# Patient Record
Sex: Male | Born: 1935 | Race: White | Hispanic: No | Smoking: Never smoker
Health system: Southern US, Community
[De-identification: ages and names within clinical notes are randomized; demographics above are authoritative.]

## PROBLEM LIST (undated history)

## (undated) DIAGNOSIS — F329 Major depressive disorder, single episode, unspecified: Secondary | ICD-10-CM

## (undated) DIAGNOSIS — I639 Cerebral infarction, unspecified: Secondary | ICD-10-CM

## (undated) DIAGNOSIS — F4310 Post-traumatic stress disorder, unspecified: Secondary | ICD-10-CM

## (undated) DIAGNOSIS — K922 Gastrointestinal hemorrhage, unspecified: Secondary | ICD-10-CM

## (undated) DIAGNOSIS — J449 Chronic obstructive pulmonary disease, unspecified: Secondary | ICD-10-CM

## (undated) DIAGNOSIS — I10 Essential (primary) hypertension: Secondary | ICD-10-CM

## (undated) DIAGNOSIS — F0150 Vascular dementia without behavioral disturbance: Secondary | ICD-10-CM

## (undated) HISTORY — PX: TOTAL HIP ARTHROPLASTY: SHX124

---

## 2003-05-29 ENCOUNTER — Emergency Department (HOSPITAL_COMMUNITY): Admission: EM | Admit: 2003-05-29 | Discharge: 2003-05-30 | Payer: Self-pay | Admitting: Emergency Medicine

## 2003-05-29 ENCOUNTER — Encounter: Payer: Self-pay | Admitting: Emergency Medicine

## 2007-04-30 ENCOUNTER — Emergency Department (HOSPITAL_COMMUNITY): Admission: EM | Admit: 2007-04-30 | Discharge: 2007-04-30 | Payer: Self-pay | Admitting: Emergency Medicine

## 2008-02-15 ENCOUNTER — Inpatient Hospital Stay (HOSPITAL_COMMUNITY): Admission: EM | Admit: 2008-02-15 | Discharge: 2008-02-22 | Payer: Self-pay | Admitting: Emergency Medicine

## 2008-02-16 ENCOUNTER — Ambulatory Visit: Payer: Self-pay | Admitting: Gastroenterology

## 2008-02-17 ENCOUNTER — Ambulatory Visit: Payer: Self-pay | Admitting: Gastroenterology

## 2008-02-19 ENCOUNTER — Encounter (INDEPENDENT_AMBULATORY_CARE_PROVIDER_SITE_OTHER): Payer: Self-pay | Admitting: General Surgery

## 2010-06-20 ENCOUNTER — Emergency Department (HOSPITAL_COMMUNITY)
Admission: EM | Admit: 2010-06-20 | Discharge: 2010-06-21 | Payer: Self-pay | Source: Home / Self Care | Admitting: Diagnostic Radiology

## 2010-11-29 ENCOUNTER — Emergency Department (HOSPITAL_COMMUNITY)
Admission: EM | Admit: 2010-11-29 | Discharge: 2010-11-29 | Disposition: A | Discharge status: Other Acute Inpatient Hospital | Payer: Medicare Other | Source: Home / Self Care | Attending: Emergency Medicine | Admitting: Emergency Medicine

## 2010-11-29 ENCOUNTER — Emergency Department (HOSPITAL_COMMUNITY): Payer: Medicare Other

## 2010-11-29 ENCOUNTER — Inpatient Hospital Stay (HOSPITAL_COMMUNITY)
Admission: EM | Admit: 2010-11-29 | Discharge: 2010-12-05 | DRG: 069 | Disposition: A | Discharge status: Skilled Nursing Facility | Payer: Medicare Other | Source: Other Acute Inpatient Hospital | Attending: Neurology | Admitting: Neurology

## 2010-11-29 DIAGNOSIS — Z1810 Retained metal fragments, unspecified: Secondary | ICD-10-CM

## 2010-11-29 DIAGNOSIS — I6350 Cerebral infarction due to unspecified occlusion or stenosis of unspecified cerebral artery: Secondary | ICD-10-CM | POA: Diagnosis present

## 2010-11-29 DIAGNOSIS — R29810 Facial weakness: Secondary | ICD-10-CM | POA: Diagnosis present

## 2010-11-29 DIAGNOSIS — Z8673 Personal history of transient ischemic attack (TIA), and cerebral infarction without residual deficits: Secondary | ICD-10-CM

## 2010-11-29 DIAGNOSIS — I252 Old myocardial infarction: Secondary | ICD-10-CM | POA: Diagnosis present

## 2010-11-29 DIAGNOSIS — E119 Type 2 diabetes mellitus without complications: Secondary | ICD-10-CM | POA: Insufficient documentation

## 2010-11-29 DIAGNOSIS — Z79899 Other long term (current) drug therapy: Secondary | ICD-10-CM | POA: Insufficient documentation

## 2010-11-29 DIAGNOSIS — M810 Age-related osteoporosis without current pathological fracture: Secondary | ICD-10-CM | POA: Diagnosis present

## 2010-11-29 DIAGNOSIS — R5383 Other fatigue: Secondary | ICD-10-CM | POA: Insufficient documentation

## 2010-11-29 DIAGNOSIS — R1310 Dysphagia, unspecified: Secondary | ICD-10-CM | POA: Diagnosis present

## 2010-11-29 DIAGNOSIS — R5381 Other malaise: Secondary | ICD-10-CM | POA: Diagnosis present

## 2010-11-29 DIAGNOSIS — Z7982 Long term (current) use of aspirin: Secondary | ICD-10-CM

## 2010-11-29 DIAGNOSIS — G8190 Hemiplegia, unspecified affecting unspecified side: Secondary | ICD-10-CM | POA: Diagnosis present

## 2010-11-29 DIAGNOSIS — R4789 Other speech disturbances: Secondary | ICD-10-CM | POA: Diagnosis present

## 2010-11-29 DIAGNOSIS — G459 Transient cerebral ischemic attack, unspecified: Principal | ICD-10-CM | POA: Diagnosis present

## 2010-11-29 LAB — DIFFERENTIAL
Basophils Relative: 0 % (ref 0–1)
Eosinophils Absolute: 0 10*3/uL (ref 0.0–0.7)
Eosinophils Relative: 1 % (ref 0–5)
Monocytes Absolute: 0.5 10*3/uL (ref 0.1–1.0)
Monocytes Relative: 7 % (ref 3–12)
Neutro Abs: 3.9 10*3/uL (ref 1.7–7.7)

## 2010-11-29 LAB — COMPREHENSIVE METABOLIC PANEL
ALT: 20 U/L (ref 0–53)
Albumin: 3.8 g/dL (ref 3.5–5.2)
Alkaline Phosphatase: 49 U/L (ref 39–117)
Chloride: 105 mEq/L (ref 96–112)
Glucose, Bld: 115 mg/dL — ABNORMAL HIGH (ref 70–99)
Potassium: 4.5 mEq/L (ref 3.5–5.1)
Sodium: 142 mEq/L (ref 135–145)
Total Bilirubin: 1 mg/dL (ref 0.3–1.2)
Total Protein: 6.6 g/dL (ref 6.0–8.3)

## 2010-11-29 LAB — CBC
Hemoglobin: 14 g/dL (ref 13.0–17.0)
MCH: 34 pg (ref 26.0–34.0)
MCHC: 34.3 g/dL (ref 30.0–36.0)
RDW: 12.9 % (ref 11.5–15.5)

## 2010-11-29 LAB — PROTIME-INR: Prothrombin Time: 14.1 seconds (ref 11.6–15.2)

## 2010-11-29 LAB — GLUCOSE, CAPILLARY

## 2010-11-29 LAB — URINALYSIS, ROUTINE W REFLEX MICROSCOPIC
Bilirubin Urine: NEGATIVE
Ketones, ur: NEGATIVE mg/dL
Urine Glucose, Fasting: NEGATIVE mg/dL
pH: 5.5 (ref 5.0–8.0)

## 2010-11-29 LAB — POCT CARDIAC MARKERS
CKMB, poc: <1 ng/mL — ABNORMAL LOW (ref 1.0–8.0)
Myoglobin, poc: 65.8 ng/mL (ref 12–200)

## 2010-11-29 LAB — CK TOTAL AND CKMB (NOT AT ARMC): Total CK: 34 U/L (ref 7–232)

## 2010-11-29 LAB — TROPONIN I: Troponin I: 0.02 ng/mL (ref 0.00–0.06)

## 2010-11-30 ENCOUNTER — Inpatient Hospital Stay (HOSPITAL_COMMUNITY): Payer: Medicare Other

## 2010-11-30 LAB — GLUCOSE, CAPILLARY: Glucose-Capillary: 163 mg/dL — ABNORMAL HIGH (ref 70–99)

## 2010-11-30 NOTE — H&P (Signed)
[name], [name]                 ACCOUNT NO.:  [id number]  MEDICAL RECORD NO.:  [id number]           PATIENT TYPE:  I  LOCATION:  3102                         FACILITY:  [facility]  PHYSICIAN:  Levert Feinstein, MD          DATE OF BIRTH:  December 17, 1932  DATE OF ADMISSION:  11/29/2010 DATE OF DISCHARGE:                             HISTORY & PHYSICAL   STROKE LOG:  The patient arrival time is 9:46.  Stroke team arrived at 9:25.  Time of ED initial evaluation was non applicable.  Last seen normal 7:30.  Code stroke was called at 8:48.  NIH stroke scale was 11. CT was read on February 16 at 8:52.  Modified Rankin at baseline was 3. Time of [facility] neurology initial examination was 9:48.  IV t-PA was given at 9:14 at [facility], later shipped to [facility].  CHIEF COMPLAINT:  Acute onset of right hemiparesis.  HISTORY OF PRESENT ILLNESS:  The patient is a 77 year old right-handed Caucasian male, alone at the bedside.  He woke up by 6:45 a.m.  While dressing himself, noticed acute onset of slurred speech, dense right arm and leg weakness.  He presented to [facility] at 8:05 and after discussion with Dr. Adriana Simas, was given IV t-PA full dose, needle time was 9:14, later transferred by [facility], with direct admission to 3100.  The patient reports his speech has slight improvement, but still with dense right hemiparesis.  He denies right sensory change.  Denies chest pain or headache.  At baseline, the patient has suffered bilateral lower extremity injury during [place] war in 1960s, has bilateral knee braces, but ambulatory with bilateral cane, able to dress himself, transfer himself, but does use electronic wheelchair.  REVIEW OF SYSTEMS:  Pertinent as above.  PAST MEDICAL HISTORY: 1. Diabetes. 2. Osteoporosis. 3. TIA.  MEDICATIONS: 1. Bupropion 200 mg every day. 2,  Pioglitazone 30 mg every day. 1. Metformin 850 t.i.d. 2. Aspirin 325 mg every day. 3. Zoloft 200 mg every  morning. 4. Glyburide 10 mg twice a day. 5. Omeprazole 40 mg twice a day. 6. Multivitamins. 7. Nitroglycerin 0.4 mg p.r.n. 8. Nitroglycerin 2.5 mg b.i.d.  ALLERGIES: 1. BETADINE. 2. CORTISONE. 3. PENICILLIN. 4. SULFA.  FAMILY HISTORY:  Mother died of cancer,  SOCIAL HISTORY:  Lives with his wife.  Denies smoking or drinking.  PHYSICAL EXAMINATION:  VITAL SIGNS:  Temperature 98.7, blood pressure 127/79, and respirations 18. CARDIAC:  Regular rate and rhythm. NEUROLOGIC:  He is drowsy.  He has slurred speech but follows commands. No aphasia.  Has lead apraxia, indentured speech increased dysarthria. Cranial nerves II through XII.  Pupil equal, round, and reactive to light.  Conjugate eye movement.  No visual field cut.  Mild right lower face weakness, in denture, uvula and tongue midline.  Head turning and shoulder shrugging normal and symmetric.  Motor examination:  Dense right hemiparesis, flaccid, upper extremity 0, lower extremity 0.  He has bilateral knee brace on, was able to move toes in his left leg, but no antigravity movement proximally.  The patient stated it was  at his baseline.  There was no dysmetria noticed.  Sensory:  There was no extinction,  LABORATORY EVALUATION:  BUN 21, creatinine 1.12.  Normal CBC.  INR 1.07. EKG; normal sinus rhythm.  CT of the brain without contrast revealed at 8:52, bilateral basal ganglion calcification, old stroke involving left basal ganglion, no acute lesion.  ASSESSMENT AND PLAN:  Acute onset of right hemiparesis, most likely subcortical infarction involving left internal capsule or brainstem.  1. Status post IV t-PA, 3100 admission per protocol. 2. MRI and MRA of the brain and complete rest of the stroke     evaluation, speech evaluation.  Critical care time was 60 minutes.     Levert Feinstein, MD     YY/MEDQ  D:  11/29/2010  T:  11/29/2010  Job:  [id number]  Electronically Signed by Levert Feinstein MD on 11/30/2010 07:49:33 AM

## 2010-12-01 LAB — GLUCOSE, CAPILLARY
Glucose-Capillary: 160 mg/dL — ABNORMAL HIGH (ref 70–99)
Glucose-Capillary: 180 mg/dL — ABNORMAL HIGH (ref 70–99)

## 2010-12-02 LAB — GLUCOSE, CAPILLARY
Glucose-Capillary: 67 mg/dL — ABNORMAL LOW (ref 70–99)
Glucose-Capillary: 181 mg/dL — ABNORMAL HIGH (ref 70–99)
Glucose-Capillary: 130 mg/dL — ABNORMAL HIGH (ref 70–99)

## 2010-12-03 DIAGNOSIS — I6330 Cerebral infarction due to thrombosis of unspecified cerebral artery: Secondary | ICD-10-CM

## 2010-12-03 LAB — LIPID PANEL
HDL: 57 mg/dL (ref >39)
Total CHOL/HDL Ratio: 2.7 RATIO
Triglycerides: 85 mg/dL (ref <150)
VLDL: 17 mg/dL (ref 0–40)

## 2010-12-03 LAB — GLUCOSE, CAPILLARY: Glucose-Capillary: 155 mg/dL — ABNORMAL HIGH (ref 70–99)

## 2010-12-04 LAB — GLUCOSE, CAPILLARY
Glucose-Capillary: 108 mg/dL — ABNORMAL HIGH (ref 70–99)
Glucose-Capillary: 134 mg/dL — ABNORMAL HIGH (ref 70–99)
Glucose-Capillary: 148 mg/dL — ABNORMAL HIGH (ref 70–99)

## 2010-12-14 ENCOUNTER — Encounter (HOSPITAL_COMMUNITY): Payer: Self-pay

## 2010-12-14 ENCOUNTER — Emergency Department (HOSPITAL_COMMUNITY): Payer: Medicare Other

## 2010-12-14 ENCOUNTER — Inpatient Hospital Stay (HOSPITAL_COMMUNITY)
Admission: EM | Admit: 2010-12-14 | Discharge: 2010-12-16 | DRG: 392 | Disposition: A | Discharge status: Skilled Nursing Facility | Payer: Medicare Other | Source: Ambulatory Visit | Attending: Internal Medicine | Admitting: Internal Medicine

## 2010-12-14 DIAGNOSIS — M810 Age-related osteoporosis without current pathological fracture: Secondary | ICD-10-CM | POA: Diagnosis present

## 2010-12-14 DIAGNOSIS — E119 Type 2 diabetes mellitus without complications: Secondary | ICD-10-CM | POA: Diagnosis present

## 2010-12-14 DIAGNOSIS — I252 Old myocardial infarction: Secondary | ICD-10-CM

## 2010-12-14 DIAGNOSIS — R29898 Other symptoms and signs involving the musculoskeletal system: Secondary | ICD-10-CM | POA: Diagnosis present

## 2010-12-14 DIAGNOSIS — I2510 Atherosclerotic heart disease of native coronary artery without angina pectoris: Secondary | ICD-10-CM | POA: Diagnosis present

## 2010-12-14 DIAGNOSIS — R1310 Dysphagia, unspecified: Secondary | ICD-10-CM | POA: Diagnosis present

## 2010-12-14 DIAGNOSIS — A088 Other specified intestinal infections: Principal | ICD-10-CM | POA: Diagnosis present

## 2010-12-14 DIAGNOSIS — I69998 Other sequelae following unspecified cerebrovascular disease: Secondary | ICD-10-CM

## 2010-12-14 DIAGNOSIS — R0902 Hypoxemia: Secondary | ICD-10-CM | POA: Diagnosis present

## 2010-12-14 HISTORY — DX: Cerebral infarction, unspecified: I63.9

## 2010-12-14 LAB — CBC
MCHC: 35.7 g/dL (ref 30.0–36.0)
MCV: 97.2 fL (ref 78.0–100.0)
Platelets: 191 10*3/uL (ref 150–400)
RDW: 13 % (ref 11.5–15.5)
WBC: 10.5 10*3/uL (ref 4.0–10.5)

## 2010-12-14 LAB — DIFFERENTIAL
Basophils Absolute: 0 10*3/uL (ref 0.0–0.1)
Eosinophils Absolute: 0.2 10*3/uL (ref 0.0–0.7)
Eosinophils Relative: 2 % (ref 0–5)
Monocytes Absolute: 0.7 10*3/uL (ref 0.1–1.0)

## 2010-12-14 LAB — POCT I-STAT 3, ART BLOOD GAS (G3+)
Acid-base deficit: 1 mmol/L (ref 0.0–2.0)
Bicarbonate: 24.3 mEq/L — ABNORMAL HIGH (ref 20.0–24.0)
O2 Saturation: 90 %
TCO2: 25 mmol/L (ref 0–100)
pCO2 arterial: 40.1 mmHg (ref 35.0–45.0)
pO2, Arterial: 60 mmHg — ABNORMAL LOW (ref 80.0–100.0)

## 2010-12-14 LAB — POCT CARDIAC MARKERS
CKMB, poc: <1 ng/mL — ABNORMAL LOW (ref 1.0–8.0)
Myoglobin, poc: 149 ng/mL (ref 12–200)
Troponin i, poc: <0.05 ng/mL (ref 0.00–0.09)

## 2010-12-14 LAB — GLUCOSE, CAPILLARY

## 2010-12-14 LAB — PROTIME-INR: INR: 1.05 (ref 0.00–1.49)

## 2010-12-14 LAB — BRAIN NATRIURETIC PEPTIDE: Pro B Natriuretic peptide (BNP): <30 pg/mL (ref 0.0–100.0)

## 2010-12-14 LAB — CK TOTAL AND CKMB (NOT AT ARMC): CK, MB: 1 ng/mL (ref 0.3–4.0)

## 2010-12-14 LAB — COMPREHENSIVE METABOLIC PANEL
AST: 36 U/L (ref 0–37)
Albumin: 3.9 g/dL (ref 3.5–5.2)
Alkaline Phosphatase: 106 U/L (ref 39–117)
CO2: 25 mEq/L (ref 19–32)
Chloride: 102 mEq/L (ref 96–112)
Creatinine, Ser: 0.93 mg/dL (ref 0.4–1.5)
GFR calc Af Amer: >60 mL/min (ref >60)
GFR calc non Af Amer: >60 mL/min (ref >60)
Potassium: 4.4 mEq/L (ref 3.5–5.1)
Total Bilirubin: 1.1 mg/dL (ref 0.3–1.2)

## 2010-12-14 LAB — APTT: aPTT: 30 seconds (ref 24–37)

## 2010-12-14 MED ORDER — IOHEXOL 300 MG/ML  SOLN: 100.0000 mL | Freq: Once | INTRAMUSCULAR | Status: AC | PRN

## 2010-12-15 ENCOUNTER — Inpatient Hospital Stay (HOSPITAL_COMMUNITY): Payer: Medicare Other

## 2010-12-15 LAB — CARDIAC PANEL(CRET KIN+CKTOT+MB+TROPI)
CK, MB: 1.3 ng/mL (ref 0.3–4.0)
CK, MB: 1.3 ng/mL (ref 0.3–4.0)
CK, MB: 1 ng/mL (ref 0.3–4.0)
CK, MB: 0.8 ng/mL (ref 0.3–4.0)
Relative Index: INVALID (ref 0.0–2.5)
Relative Index: INVALID (ref 0.0–2.5)
Relative Index: INVALID (ref 0.0–2.5)
Total CK: 91 U/L (ref 7–232)
Troponin I: 0.01 ng/mL (ref 0.00–0.06)
Troponin I: 0.01 ng/mL (ref 0.00–0.06)
Troponin I: 0.01 ng/mL (ref 0.00–0.06)
Troponin I: 0.01 ng/mL (ref 0.00–0.06)

## 2010-12-15 LAB — URINE MICROSCOPIC-ADD ON

## 2010-12-15 LAB — GLUCOSE, CAPILLARY
Glucose-Capillary: 150 mg/dL — ABNORMAL HIGH (ref 70–99)
Glucose-Capillary: 170 mg/dL — ABNORMAL HIGH (ref 70–99)
Glucose-Capillary: 171 mg/dL — ABNORMAL HIGH (ref 70–99)
Glucose-Capillary: 140 mg/dL — ABNORMAL HIGH (ref 70–99)

## 2010-12-15 LAB — URINALYSIS, ROUTINE W REFLEX MICROSCOPIC
Glucose, UA: NEGATIVE mg/dL
Ketones, ur: NEGATIVE mg/dL
Nitrite: NEGATIVE
Specific Gravity, Urine: >1.03 — ABNORMAL HIGH (ref 1.005–1.030)
pH: 5 (ref 5.0–8.0)

## 2010-12-16 LAB — CARDIAC PANEL(CRET KIN+CKTOT+MB+TROPI)
CK, MB: 0.6 ng/mL (ref 0.3–4.0)
CK, MB: 0.7 ng/mL (ref 0.3–4.0)
Relative Index: INVALID (ref 0.0–2.5)
Total CK: 47 U/L (ref 7–232)
Troponin I: <0.01 ng/mL (ref 0.00–0.06)

## 2010-12-16 LAB — URINE CULTURE: Colony Count: 55000

## 2010-12-16 LAB — GLUCOSE, CAPILLARY: Glucose-Capillary: 127 mg/dL — ABNORMAL HIGH (ref 70–99)

## 2010-12-20 LAB — CULTURE, BLOOD (ROUTINE X 2)
Culture  Setup Time: 201203022155
Culture  Setup Time: 201203022155
Culture: NO GROWTH

## 2010-12-24 ENCOUNTER — Other Ambulatory Visit (HOSPITAL_BASED_OUTPATIENT_CLINIC_OR_DEPARTMENT_OTHER): Payer: Self-pay | Admitting: Internal Medicine

## 2010-12-24 DIAGNOSIS — M25561 Pain in right knee: Secondary | ICD-10-CM

## 2010-12-24 DIAGNOSIS — L02415 Cutaneous abscess of right lower limb: Secondary | ICD-10-CM

## 2010-12-27 LAB — URINALYSIS, ROUTINE W REFLEX MICROSCOPIC
Bilirubin Urine: NEGATIVE
Nitrite: NEGATIVE
Specific Gravity, Urine: 1.015 (ref 1.005–1.030)
Urobilinogen, UA: 0.2 mg/dL (ref 0.0–1.0)

## 2010-12-27 LAB — POCT CARDIAC MARKERS
CKMB, poc: <1 ng/mL — ABNORMAL LOW (ref 1.0–8.0)
Troponin i, poc: <0.05 ng/mL (ref 0.00–0.09)
Troponin i, poc: <0.05 ng/mL (ref 0.00–0.09)

## 2010-12-27 LAB — COMPREHENSIVE METABOLIC PANEL
ALT: 20 U/L (ref 0–53)
AST: 25 U/L (ref 0–37)
Alkaline Phosphatase: 63 U/L (ref 39–117)
CO2: 23 mEq/L (ref 19–32)
Chloride: 105 mEq/L (ref 96–112)
GFR calc Af Amer: >60 mL/min (ref >60)
GFR calc non Af Amer: >60 mL/min (ref >60)
Glucose, Bld: 183 mg/dL — ABNORMAL HIGH (ref 70–99)
Potassium: 4.2 mEq/L (ref 3.5–5.1)
Sodium: 137 mEq/L (ref 135–145)
Total Bilirubin: 0.6 mg/dL (ref 0.3–1.2)

## 2010-12-27 LAB — CBC
HCT: 39.9 % (ref 39.0–52.0)
Hemoglobin: 13.6 g/dL (ref 13.0–17.0)
RBC: 3.97 MIL/uL — ABNORMAL LOW (ref 4.22–5.81)

## 2010-12-27 LAB — LIPASE, BLOOD: Lipase: 37 U/L (ref 11–59)

## 2010-12-27 LAB — DIFFERENTIAL
Basophils Absolute: 0 10*3/uL (ref 0.0–0.1)
Basophils Relative: 0 % (ref 0–1)
Eosinophils Absolute: 0 10*3/uL (ref 0.0–0.7)
Eosinophils Relative: 1 % (ref 0–5)
Lymphs Abs: 1.7 10*3/uL (ref 0.7–4.0)

## 2010-12-27 NOTE — Discharge Summary (Signed)
NAMEARMARION, [name]                 ACCOUNT NO.:  [id number]  MEDICAL RECORD NO.:  [id number]           PATIENT TYPE:  I  LOCATION:  2034                         FACILITY:  [facility]  PHYSICIAN:  Peggye Pitt, M.D. DATE OF BIRTH:  [birth year]  DATE OF ADMISSION:  12/14/2010 DATE OF DISCHARGE:  12/16/2010                        DISCHARGE SUMMARY - REFERRING   DISCHARGE DIAGNOSES: 1. Nausea and vomiting, resolved, likely secondary to viral     gastroenteritis. 2. History of recent cardiovascular accident in February 2012, status     post TPA currently at skilled nursing facility for rehabilitation. 3. Hypoxia, improved on 2-3 liters of O2, secondary to atelectasis. 4. Diabetes mellitus type 2. 5. Dysphagia. 6. Osteoporosis. 7. History of transient ischemic attack in the past. 8. History of coronary artery disease. 9. History of peptic ulcer disease.  DISCHARGE MEDICATIONS: 1. Actos 30 mg daily. 2. Bupropion SR 200 mg daily. 3. Fish oil 1200 mg 1 capsule twice daily. 4. Glyburide 5 mg 2 tablets twice daily. 5. Loperamide 2 mg daily as needed for diarrhea. 6. Metformin 850 mg 1 tablet twice daily. 7. Multivitamin 1 tablet daily. 8. NitroTime 2.5 mg 1 capsule twice daily. 9. Nitroglycerin 0.4 mg sublingual every 5 minutes up to three times     as needed for chest pain. 10.Omeprazole 40 mg twice daily. 11.Zoloft 100 mg 2 tablets daily.  DISPOSITION AND FOLLOWUP:  [name] will be discharged hopefully back to skilled nursing facility today in stable and improved condition.  He no longer has any nausea, vomiting.  Please note that he has been somewhat hypoxic here.  He has had a CT angiogram that demonstrated some atelectasis only to do incentive spirometry q.4 h while awake.  IMAGES AND PROCEDURES THIS HOSPITALIZATION: 1. A CT scan of the head on March 2 that showed subacute left deep     white matter infarct is to be demonstrated without hemorrhage.  A     small left frontal  subdural hygroma in stable.  Atrophy and chronic     microvascular ischemic change without significant progression.  2.     Chest x-ray on March 2 that showed no acute abnormalities. 2. A CT angiogram of the chest on March 2 that showed no CT findings     for PE.  Coronary artery calcifications.  No acute pulmonary     findings other than dependent bibasilar atelectasis.  There is also     a calcification of the thoracic aorta, but no focal aneurysm or     dissection.  An acute abdominal series on March 3 that showed no     evidence for obstruction or free air.  HISTORY AND PHYSICAL:  For complete details please see dictation on March 2 by Dr. Gerri Lins, but in brief [name] is a very pleasant 77 year old Caucasian gentleman who was recently discharged from this facility on February 22, status post a CVA for which he received TPA and was discharged to skilled nursing facility.  The patient returns to the hospital stay, for the last 3 days he has had intractable nausea and vomiting.  He attributes  this to the thickener that he is using for his foods.  Because of this he was admitted for further evaluation and management.  HOSPITAL COURSE BY PROBLEM: 1. Nausea and vomiting.  This has resolved spontaneously.  I believe     at this point that this was likely secondary to some viral     gastroenteritis.  He has never had diarrhea. 2. Hypoxia.  Upon admission he was found to be 87%-88% on room air.     He has required 2-3 liters of oxygen here.  A CT angiogram of the     chest was performed that did not show any evidence for pulmonary     embolism, pneumonia, or aspiration.  There was however dependent     bibasilar atelectasis.  I have recommended that at facility he     receive q.4 hour incentive spirometry while awake.  Also for the     time being he will need to continue his 2-3 liters of oxygen. 3. Dysphagia.  He was evaluated by Speech Therapy for swallow     evaluation on this  admission.  They recommended a dysphagia 2 diet     with nectar thick liquids and to continue aspiration precautions.     He can have his medications whole in applesauce and full     supervision with all meals. 4. Rest of chronic conditions have been stable. 5. Vitals on day of discharge blood pressure 130/72, heart rate 83,     respirations 18, sats of 92% on 3 liters and a temperature of 98.6.     Peggye Pitt, M.D.     EH/MEDQ  D:  12/16/2010  T:  12/16/2010  Job:  [id number]  Electronically Signed by Peggye Pitt M.D. on 12/27/2010 05:39:44 PM

## 2010-12-28 ENCOUNTER — Other Ambulatory Visit (HOSPITAL_COMMUNITY): Payer: Medicare Other

## 2010-12-28 NOTE — Discharge Summary (Signed)
[name], [name]                 ACCOUNT NO.:  [id number]  MEDICAL RECORD NO.:  [id number]           PATIENT TYPE:  I  LOCATION:  3025                         FACILITY:  [facility]  PHYSICIAN:  Carole Deere P. Pearlean Brownie, MD    DATE OF BIRTH:  October 04, 1933  DATE OF ADMISSION:  11/29/2010 DATE OF DISCHARGE:  12/05/2010                        DISCHARGE SUMMARY - REFERRING   DIAGNOSES AT TIME OF DISCHARGE: 1. Acute left basal ganglia infarcts status post full-dose IV t-PA. 2. Diabetes. 3. Osteoporosis. 4. Transient ischemic attack. 5. Bilateral lower extremity injury from the [place] war, has used     braces ever since. 6. Dysphagia.  MEDICINES AT TIME OF DISCHARGE: 1. Clopidogrel 75 mg a day. 2. Naproxen 500 mg b.i.d. 3. Tramadol 50 mg 2 tablets q.6 h for pain. 4. Actos 30 mg a day. 5. Bupropion SR 200 mg a day. 6. Fish oil 1200 mg b.i.d. 7. Glyburide 5 mg 2 tablets b.i.d. 8. Loperamide 2 mg 1 capsule as needed for diarrhea. 9. Metformin 850 mg b.i.d. 10.Multivitamin one a day. 11.Nitroglycerin 0.4 mg every 5 minutes as needed up to three doses. 12.Nitroglycerin 2.5 mg 1 capsule b.i.d. 13.Omeprazole 50 mg b.i.d. 14.Sertraline 100 mg 2 tablets a day.  STUDIES PERFORMED: 1. CT of the brain on admission shows no acute abnormality.  Atrophy     and chronic microvascular disease. 2. X-ray, negative for metallic foreign body. 3. MRI of the brain showed acute infarct in the posterior external     capsule on the left, chronic hemorrhagic infarct, left putamen deep     white matter. 4. MRA of the head shows no large vessel occlusion or significant     stenosis. 5. 2-D echocardiogram shows EF of 60-65% with no obvious source of     embolus. 6. Carotid Doppler shows no RCA stenosis. 7. Transcranial Doppler is normal in the anterior circulations.  Poor     occipital windows limit posterior circulation evaluation. 8. EKG, normal sinus rhythm.  LABORATORY STUDIES:  Cholesterol 153, triglycerides  85, HDL 57, LDL 79, MRSA screening negative.  Urinalysis, negative.  CBC with red blood cells 4.12, platelets 106, otherwise normal differential.  Normal chemistry with glucose 115, BUN 24, creatinine 1.12, otherwise normal. Liver function tests normal.  Cardiac enzymes negative.  HISTORY OF PRESENT ILLNESS:  The patient is a 77 year old right-handed Caucasian male, who woke the morning of admission at 6:45 a.m.  While dressing himself, he noticed acute onset of slurred speech, dense right arm and leg hemiparesis.  He presented to [facility] Emergency Room at 8:05 and after discussion with Dr. Adriana Simas was given IV t-PA full dose and transferred to [facility] by [facility].  T-PA was given at 9:14 a.m. Upon arrival to [facility], the patient reports his speech has slight improvement, but still with dense right hemiparesis.  He denies right sensory change, headache, or pain.  At baseline, the patient suffered bilateral lower extremity injury during the [place] War and has bilateral knee braces, but ambulatory with them and a cane.  He is able to dress himself, transfer himself, but does use an electronic  wheelchair at times.  He was admitted to the Neuro ICU for further stroke evaluation.  HOSPITAL COURSE:  MRI showed an acute left basal ganglia infarct.  This infarct was found to be secondary to likely small-vessel disease.  He was on aspirin prior to admission and changed to Plavix for secondary stroke prevention.  He was evaluated by speech therapy and found to be able to tolerate a dysphagia II solid nectar-thick liquid diet.  He will be discharged on this diet with plans for speech therapy follow up of his swallowing ability.  He was evaluated by PT, OT, and speech therapy and failed to benefit from inpatient rehab.  As family cannot provide needed care within the next few weeks at home, they made the decision for SNF placement for rehab through the [facility].  Arrangements were made and  the patient has plans for transfer.  Of note, during hospital the patient had lower extremity pain.  He has some chronic lower extremity pain from his injury and took narcotics at home.  He was placed on Ultram around the clock for pain.  This can be continued or stopped based on the patient's need.  CONDITION ON DISCHARGE:  The patient is alert and oriented x3.  Normal speech, slightly dysarthric speech, but no aphasia.  He has mild right hemiparesis.  Right upper extremity is 2/5, right lower extremity is 4/5.  He has good movement on this left side.  His heart rate is regular.  His breath sounds are clear.  DISCHARGE/PLAN: 1. Transfer to [facility] for ongoing PT, OT, and speech     therapy. 2. Plavix for secondary stroke prevention. 3. Follow up swallow evaluation, increase diet as tolerated. 4. Evaluate need for ongoing Ultram for lower extremity discomfort. 5. Follow up primary care physician in [facility] system. 6. Follow up neurologist in 2 months, in [place] or Dr. Pearlean Brownie in [place],      the patient's choice.     Annie Main, N.P.   ______________________________ Sunny Schlein. Pearlean Brownie, MD SB/MEDQ  D:  12/05/2010  T:  12/05/2010  Job:  [id number]  cc:   Kosisochukwu Burningham P. Pearlean Brownie, MD [facility]  Electronically Signed by Annie Main N.P. on 12/14/2010 11:20:14 AM Electronically Signed by Delia Heady MD on 12/28/2010 11:14:34 AM

## 2010-12-28 NOTE — H&P (Signed)
[name], [name]                 ACCOUNT NO.:  [id number]  MEDICAL RECORD NO.:  [id number]           PATIENT TYPE:  LOCATION:                                 FACILITY:  PHYSICIAN:  Jerni Selmer, DO         DATE OF BIRTH:  1933-02-12  DATE OF ADMISSION: DATE OF DISCHARGE:                             HISTORY & PHYSICAL   CHIEF COMPLAINTS:  Intractable nausea and vomiting.  HISTORY OF PRESENT ILLNESS:  The patient is a 77 year old male, discharged from this facility on December 05, 2010, after admission for a left basal ganglia infarct.  The patient is status post TPA, but still has residual weakness on the right side of his body.  The patient went from this facility to SNF.  The patient states that for the last 3 days he has had intractable nausea and vomiting.  The ER doctor also related to me that he had been confused.  He was not confused for me.  He was able to give a good story.  He does have some dysarthria and was somewhat difficult to understand, but he was not confused.  The patient also presented with hypoxemia.  He was 87% on room air.  When asking the patient about shortness of breath, he says yes he is short of breath. He has been short of breath for 3 months or more.  The patient states that he has been eating, but every time he eats, he throws it right back up.  He relates the beginning of his nausea and vomiting to the change in the consistency of his diet to no thin liquids.  He believes that this is secondary that is causing him to be sick.  PAST MEDICAL HISTORY: 1. Stroke, left basal ganglia, status post TPA with right-sided     residual weakness and dysarthria. 2. 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 3. Diabetes mellitus. 4. Osteoporosis. 5. TIA. 6. Coronary artery disease status post MI many years ago. 7. Peptic ulcer disease.  PAST SURGICAL HISTORY:  Significant for bilateral knee surgery, cholecystectomy, back surgery.  MEDICATIONS:  At the  facility included: 1. Actos 30 mg one p.o. daily. 2. Wellbutrin SR 200 mg one p.o. daily. 3. Fish oil 1200 mg one p.o. b.i.d. 4. Glyburide 5 mg two p.o. b.i.d. 5. Loperamide 2 mg one p.o. daily p.r.n. loose stools. 6. Metformin 850 mg one p.o. b.i.d. 7. Multivitamin one p.o. daily. 8. Nitroglycerin 2.5 mg 1 capsule p.o. b.i.d. 9. Nitroglycerin 0.4 mg 1 tablet q.5 minutes p.r.n. chest pain x3. 10.Omeprazole 40 mg one p.o. b.i.d. 11.Sertraline 100 mg two p.o. daily.  ALLERGIES:  PENICILLIN, BETADINE, CORTISONE, AND SULFA.  SOCIAL HISTORY:  No tobacco.  No alcohol.  No recreational drug use.  He is currently a resident of a subacute nursing facility in [place].  FAMILY HISTORY:  Mother died of cancer.  REVIEW OF SYSTEMS:  CONSTITUTIONAL:  No fevers.  No chills.  He has been eating, but has not been able to keep anything down for the past few days.  He has malaise.  CNS:  No seizures.  No specific limb weakness. No headaches.  No loss of consciousness.  ENT:  No nasal congestion or throat pain.  No coryza.  CARDIOVASCULAR:  No chest pain.  No palpitation.  No orthopnea.  RESPIRATORY:  Positive for shortness of breath which has been going on for 3 months or more.  No cough.  No wheeze.  No sputum production.  GASTROINTESTINAL:  Positive for nausea, positive for vomiting, positive for loose stools which have been chronic since his cholecystectomy, that is what he takes the loperamide for.  He had loose stools 2 days prior to presentation at the ER here at [facility].  GENITOURINARY:  No dysuria.  No hematuria.  No urinary frequency. RENAL:  No flank pain. No swelling.  No pruritus.  SKIN:  No rashes.  No sores.  No lesions.  HEMATOLOGIC:  No easy bruising, no purpura, no clots.  LYMPH:  No lymphadenopathy.  No painful nodes.  No specific lymph swelling.  PSYCHIATRIC:  Positive for depression.  Negative for anxiety.  Negative for insomnia.  PHYSICAL EXAM:  VITALS:  Pulse 89,  respiratory rate 16, O2 sat on room air is 87, temperature 98.2, blood pressure 124/78. GENERAL:  The patient is an elderly, chronically and acutely ill- appearing male.  He is awake, alert, and oriented x3.  He has significant dysarthria noted, somewhat difficult to understand them, but he is awake, alert, and oriented x3 and is able to give a fair history. HEENT:  Eyes:  Pupils are equal, round, reactive to light, and accommodation.  External ocular movements bilaterally intact.  Sclarea nonicteric, noninjected.  Mouth:  Oral mucosa is dry.  No lesions.  No sores.  Pharynx clear.  No erythema.  No exudate. NECK:  Negative for JVD.  Negative for thyromegaly.  Negative for lymphadenopathy. HEART:  Regular rate and rhythm at 90 beats per minute without murmur, ectopy, or gallops.  No lateral PMI.  No thrills. LUNGS:  Decreased breath sounds bilaterally.  Otherwise, no rales, no wheezes, no rhonchi.  Positive for increased work of breathing.  No tactile fremitus. ABDOMEN:  Soft, nontender, nondistended.  Positive bowel sounds.  No hepatosplenomegaly.  No hernias palpated. EXTREMITIES:  Negative for cyanosis, clubbing, or edema.  The patient has greatly diminished dorsalis pedis and popliteal pulses bilaterally. No carotid bruits bilaterally. NEUROLOGIC:  The patient has 2/5 weakness of the upper and lower extremities on the right side; +3 DTRs on the right side, +2 on the left side, upper and lower extremities.  Cranial nerves II through XII grossly intact.  LABORATORY:  The pH is 7.39, PCO2 is 40.1, PaO2 is 60.0, carbon dioxide is 24.3.  WBC is 10.5, hemoglobin 16.2, hematocrit 45.4, platelets 191. Sodium 139, potassium 4.4, chloride 102, CO2 25, BUN 20, creatinine 0.93, glucose 121, total bili 1.1, alk phos 106, AST 36, ALT 35, total protein 7.8, albumin 3.9, calcium 10.1, lipase 27.  CK 62, MB is 1, troponin I is 0.02.  CK-MB is less than 1.  Troponin I is less than 0.05.  Myoglobin  149.  BNP is less than 30.  Urinalysis shows specific gravity greater 1.03, small amount of bilirubin, large amount of blood, small amount of leukocytes, negative nitrites, rare epithelial cells. CT angiogram of the chest showed no PE, mild tortuosity, ectasia and calcification of the thoracic aorta, coronary artery calcifications, no acute pulmonary findings.  Chest x-ray showed no acute abnormality.  CT head shows subacute left deep white matter infarct, re-demonstrated without hemorrhage, small left  frontal subdural hygroma is stable, atrophy, and chronic microvascular ischemic changes without significant progressions.  EKG showed normal sinus rhythm.  ASSESSMENT: 1. Intractable nausea and vomiting.  Differential diagnosis includes:     a.     Viral.     b.     Secondary to the thickener.     c.     Gastritis.     d.     The patient does have a mild UTI, but I doubt that this      would be severe enough to cause him to have this intractable      nausea and vomiting. 2. Dysphasia.  A nurse screen of his swallow was performed in the     emergency department and the patient passed, so he will be     continued on a regular consistency diet and we can try out the     theory that the thickener was the cause for his nausea, vomiting. 3. Hypoxemia and respiratory distress.  Differential diagnosis     includes:     a.     COPD.  The patient does have decreased breath sounds and his      shortness of breath at least had him know about the hypoxemia.      The shortness of breath has been going on for 3 months or more.     b.     The patient has had intractable nausea and vomiting for 3      days and to possible that he may have aspirated, and certainly is      not evident on chest x-ray and his white count is normal.     c.     Part of the viral syndrome. 4. Diabetes mellitus. 5. Osteoporosis. 6. Chronic injuries to leg bilaterally in the 70s when in the     [employer]. 7. Heart  aneurysm. 8. Coronary artery disease.  PLAN:  The patient will be admitted.  He will be given a cardiac diet because the patient badly wants to eat.  I told him that if he vomited we will back him up to n.p.o. status.  He will be given breathing treatments, antibiotics, antiemetics, and continued on his home medications.  His diabetic medications will be held and we will check fingerstick blood sugars q.6 h. and follow them with sliding scale only until we are sure that the patient is going to be able to tolerate a diet.  I spent 52 minutes in this H and P.  Please note, the patient was seen, examined, and admitted before midnight.  This is a late entry dictation.          ______________________________ Fran Lowes, DO     AS/MEDQ  D:  12/15/2010  T:  12/15/2010  Job:  [id number]  cc:   Primary Care Doctor  Electronically Signed by Fran Lowes DO on 12/28/2010 04:51:44 PM

## 2011-02-26 NOTE — H&P (Signed)
[name], [name]                 ACCOUNT NO.:  [id number]   MEDICAL RECORD NO.:  [id number]          PATIENT TYPE:  INP   LOCATION:  A328                          FACILITY:  [facility]   PHYSICIAN:  Skeet Latch, DO    DATE OF BIRTH:  1933-09-21   DATE OF ADMISSION:  02/15/2008  DATE OF DISCHARGE:  LH                              HISTORY & PHYSICAL   CHIEF COMPLAINT:  Fever.   HISTORY OF PRESENT ILLNESS:  This is a 77 year old Caucasian male who  presents with family members secondary to emesis and fever.  Family  members state that yesterday the patient was complaining about some  episodes of emesis.  This continued throughout the day.  Then today the  patient continued to have emesis and also was having some frequency of  urination.  Family members went over to the patient's house and stated  that he had a yellow tint to his sclerae and skin and they also took his  temperature, which they state was approximately 104.  Family members  state that his emesis had lots of mucus and was strange in color.  Family members also state the patient was very weak and they had a hard  time getting him up and out of his chair.  They also state that last  week the patient had a fall.  They took him to the [place] in [place], which  is his primary care physicians.  He had an x-ray of his hip which was  normal.  He also had a urinalysis that was normal and also they state he  had blood work which was also normal at that time.  The patient was sent  home with supportive measures.  On exam the patient is stable.  He is  lying in bed.  He is slightly lethargic but will answer questions  appropriately when asked.   PAST MEDICAL HISTORY:  1. Diabetes.  2. Osteoporosis.  3. Transient ischemic attacks.   SURGICAL HISTORY:  Bilateral knee surgery.   SOCIAL HISTORY:  No history of smoking, alcohol or illicit drug use.  He  lives with his wife.   ALLERGIES:  PENICILLIN and CORTISONE.   HOME MEDICATIONS:  1. Gabapentin 300 mg twice daily.  2. Metformin 850  mg twice a day.  3. Naproxen 500 mg twice daily.  4. Nitroglycerin 2.5 mg twice a day.  5. Nystatin orally twice daily.  6. Omeprazole 20 mg twice a day.  7. OxyContin 5 mg q.6 h.  8. Senna 8.6 mg at bedtime.  9. Zoloft 200 mg a day.  10.Sorbitol 1-2 tablespoons twice a day.  11.Temazepam 30 mg at bedtime.  12.Nystatin/triamcinolone topical once a day.  13.Aspirin 325 mg daily.  14.Glyburide 5 mg twice a day.  15.Simvastatin 80 mg at bedtime.  16.Docusate sodium 100 mg at bedtime.   REVIEW OF SYSTEMS:  CONSTITUTIONAL:  Positive fever.  No weight loss,  weight gain, chills.  HEENT:  Positive for scleral icterus.  CARDIOVASCULAR:  No chest pain or palpitations.  RESPIRATORY:  No  dyspnea or wheezing.  GASTROINTESTINAL:  Positive for emesis, slight  nausea.  No abdominal pain.  Blood in stools.  GENITOURINARY:  Positive  for some frequency.  No hematuria noted.  MUSCULOSKELETAL:  No  arthralgias or myalgias.  Other systems are negative.   PHYSICAL EXAM:  GENERAL:  The patient is lethargic, well-developed, well-  nourished, well-hydrated.  Is jaundiced on exam.  VITAL SIGNS:  Temperature is 98.5, pulse 94, respirations 20, blood  pressure 83/49.  HEENT:  Head is atraumatic, normocephalic.  Positive for scleral  icterus.  PERRLA, EOMI.  NECK:  Soft, supple, nontender, nondistended.  CARDIOVASCULAR:  Regular rate and rhythm.  No murmurs, rubs or gallops.  LUNGS:  Clear to auscultation bilaterally.  No rales, rhonchi or  wheezing.  ABDOMEN:  Soft, nontender, nondistended.  No rigidity or guarding.  Minimal bowel sounds are appreciated.  EXTREMITIES:  No clubbing, cyanosis, or edema.  SKIN:  Positive for jaundice.  No obvious rashes or moles are noted.  NEUROLOGIC:  Patient slightly lethargic.  Will answer questions when  asked.  Seems to be alert and oriented x3.   LABS:  Microscopic urine had rare bacteria.  Urinalysis positive  for  glucose, large bilirubin, trace of blood.  Negative for nitrite or  leukocytes.  Direct bilirubin 7.5.  Sodium 139, potassium 3.7, chloride  105, CO2 21, glucose 225, BUN 17, creatinine 0.94.  His white count is  11,600, hemoglobin 15.6, hematocrit 44.6, platelet count 108,000, 76  neutrophils, 2 lymphocytes, 2 monocytes, absolute neutrophils 11.2,  absolute lymphocytes 0.2.   RADIOLOGIC STUDIES:  CT of his abdomen and pelvis showed:  (1) Biliary  ductal dilatation, probable 11-mm common duct Seelinger.  Consider ERCP.  (2) Gallstones or stones with borderline gallbladder distention.  Consider ultrasound correlation to exclude acute cholecystitis.  (3)  Left lower lobe nodular or pleural nodularity.  The patient is a smoker,  has risk factors for bronchitic carcinoma.  A follow-up chest CT at 1  year is recommended.  His pelvis showed:  (1) No acute pelvic processes.  (2) Questionable bladder wall thickening versus underdistention,  probable mild bladder outlet obstruction.  (3) Suspect mild fecal  impaction.  Chest x-ray:  (1) No acute cardiopulmonary disease.  (2)  Moderate cardiomegaly without CHF.   ASSESSMENT:  1. Biliary ductal Twiford.  2. Jaundice with elevated liver function tests.  3. Hypotension.  4. Pyrexia.  5. History of diabetes.  6. History of osteoporosis.   PLAN:  1. The patient will be admitted to a general medical bed on the      service of [facility].  2. For his biliary ductal Weninger will obtain a gastroenterology      consult.  The patient probably will need an ERCP.  We will get an      abdominal ultrasound in the a.m.  3. For his jaundice and elevated LFTs, probably secondary to his      ductal Coye.  We will repeat his blood work and his liver function      tests in the a.m.  4. For his hypotension, unknown etiology at this time.  We will      increase his IV fluids and place the patient on IV antibiotics.  5. Pyrexia.  Blood and urine cultures will be  obtained.  The patient      is on Tylenol.  We will be cautious with Tylenol secondary to the      patient's LFTs.  May switch it to ibuprofen at this time.  6. For his diabetes the patient will be placed on a resistant sliding      scale and will be placed on a diabetic diet and be placed on his      home medications.  The patient will have his blood sugars checked      a.c. and nightly.  7. The patient will be placed on DVT as well as GI prophylaxis.      Skeet Latch, DO  Electronically Signed     SM/MEDQ  D:  02/16/2008  T:  02/16/2008  Job:  [id number]

## 2011-02-26 NOTE — Discharge Summary (Signed)
[name], [name]                 ACCOUNT NO.:  [id number]   MEDICAL RECORD NO.:  [id number]          PATIENT TYPE:  INP   LOCATION:  A328                          FACILITY:  [facility]   PHYSICIAN:  Lucita Ferrara, MD         DATE OF BIRTH:  [birth year]   DATE OF ADMISSION:  02/15/2008  DATE OF DISCHARGE:  05/11/2009LH                               DISCHARGE SUMMARY   DISCHARGE DIAGNOSES:  1. Biliary ductal Lachman.  2. Admitted with jaundice and elevated liver function tests.  3. Hypotension.  4. Pyrexia.  5. History of diabetes.  6. History of osteoporosis.  7. Status post laparoscopic cholecystectomy for acute cholecystitis.   CONSULTING:  Dr. Lovell [name] from surgery, Dr. Jena [name] from gastroenterology  services.   CONDITION:  Stable.   DIET:  Low-fat diet.   ACTIVITY:  He can resume a regular activity.   PROCEDURES:  The patient had a CT scan of the abdomen Feb 18, 2008, which  showed postsurgical changes and no acute findings.  The patient had a  KUB Feb 21, 2008, which showed mild adynamic ileus.  The patient had an  ERCP Feb 17, 2008 which showed irregularity and probable filling defect  at the distal common bile duct are noted with likely due to sludge or  [place], sphincterotomy was performed.  The patient had a MRCP dated Feb 16, 2008, which showed gallstones and gallbladder sludge noted.  Mild  biliary dilatation associated with to distal CBD calculi as described.   BRIEF HISTORY OF PRESENT ILLNESS:  [name] is a 77 year old male who  presented to [facility] dated Feb 15, 2008 with emesis and  fever of up to 104.  He was admitted to the general medical floor and  surgical and gastroenterology consultation were requested.  He underwent  an MRCP followed by an ERCP and sphincterotomy.  He then underwent  laparoscopic cholecystectomy.  He is currently post-op day number 3.  He  is tolerating regular diet without any nausea or vomiting.  He is  ambulating well.  He had  no postoperative complications.   DISCHARGE MEDICATIONS:  1. Include acyclovir one application topical t.i.d.  2. Colace 100 mg p.o. b.i.d.  3. Neurontin 300 grams p.o. b.i.d.  4. He is to resume his preadmission medications including his      glyburide 5 mg b.i.d.  5. Metformin 850 mg p.o. b.i.d.  6. Flunisolide nasal daily.  7. Naproxen 500 mg b.i.d.  8. Nitroglycerin 2.5 mg b.i.d.  9. Nystatin 100,000 units per mL b.i.d.  10.Omeprazole 20 mg p.o. b.i.d.  11.OxyContin 5 mg p.o. q.6h as needed.  12.Senna 8.6 mg at bedtime.  13.Zoloft 200 mg daily.  14.Sorbitol 1 to 2 tablespoons b.i.d.  15.Temazepam 30 mg at bedtime.  16.Nystatin triamcinolone topically as needed.  17.Aspirin 325 mg daily.  18.Glyburide 5 mg p.o. b.i.d.  19.Simvastatin 80 mg p.o. at bedtime.  20.Docusate sodium 100 mg at bedtime.   DISCHARGE CONDITION:  Stable.   DISCHARGE/PLAN:  Follow up with primary care physician in 1 to 2  weeks.  Call with any fevers, chills, abdominal pain.      Lucita Ferrara, MD  Electronically Signed     RR/MEDQ  D:  02/22/2008  T:  02/22/2008  Job:  [id number]

## 2011-02-26 NOTE — Op Note (Signed)
[name], [name]                 ACCOUNT NO.:  [id number]   MEDICAL RECORD NO.:  [id number]          PATIENT TYPE:  INP   LOCATION:  A328                          FACILITY:  [facility]   PHYSICIAN:  Kassie Mends, M.D.      DATE OF BIRTH:  1933/06/02   DATE OF PROCEDURE:  02/17/2008  DATE OF DISCHARGE:                               OPERATIVE REPORT   REFERRING PHYSICIAN:  Skeet Latch, DO.   PROCEDURE:  Endoscopic retrograde cholangiopancreatography with  sphincterotomy, balloon cholangiogram, balloon extraction, and web  basket extraction.   INDICATION FOR EXAM:  Mr. [name] is a 77 year old male who presented with  a sudden onset of fever, nausea, vomiting and mild abdominal pain as  well as jaundice.  He had hypotension requiring fluid resuscitation.  He  never was placed on pressors.  He had a CT scan on admission which  revealed an 11 x 8 mm distal common bile duct.  He had a CT scan on Feb 16, 2008, which revealed mild intrahepatic duct dilation and a common  bile duct dilated 1.1 cm.  There was a question as to whether or not he  had a common bile duct Marcotte.  An MRCP performed approximately 12 hours  later revealed a common bile duct of 9 mm with a 11 x 8 mm common bile  duct filling defect.  He had no pancreatic duct dilation and the  visualized portion of his pancreas was normal.  His lab evaluation  revealed, on Feb 16, 2008, a white count of 17.7, down to 10.8 today.  His initial bilirubin was 10.7 and down to 3.1 today.  His direct  bilirubin is 1.5 and his indirect bilirubin is 1.6.  His alkaline  phosphatase is down from 310 on Feb 15, 2008, to 195.  AST is down from  228 on Feb 15, 2008, to 81.  His ALT is down from 223 to 127.  His  lipase is decreased from 595 on Feb 16, 2008, to 28.  His triglycerides  were 89.   FINDINGS:  1. A limited view of the esophagus, stomach and the duodenum due to      the use of a duodenoscope.  The esophagus, stomach and duodenum  appeared normal.  The ampulla was normal.   DIAGNOSES:  1. Distal common bile duct Cudd/fragment/debris removed with balloon      extraction catheter and web basket extractor.  2. Successful sphincterotomy performed.   RECOMMENDATIONS:  1. No aspirin, NSAIDs or anticoagulation for seven days.  2. General surgery consult for semi-elective laparoscopic      cholecystectomy.  3. Continue Levaquin for seven days.  4. Clear liquid diet and advance to a medium carbohydrate diet as      tolerated.  5. Check a lipase and hepatic function panel around 5:00 p.m.   PROCEDURE TECHNIQUE:  Physical exam was performed and informed consent  was obtained from the patient after explaining the benefits, risks and  alternatives to the procedure.  The patient was brought to the OR and  intubated and placed in the  semi-prone position.  IV medicine was  administered through an indwelling cannula.  Continuous oxygen was  provided by mechanical ventilation. After administration of sedation,  the patient's esophagus was intubated with the duodenoscope and passed  into the second portion of the duodenum.  The ampulla appeared normal.  The patient's common bile duct was cannulated using the [employer] short wire system.  A graduated wire was used.  The maximum  diameter of the wire was 0.35.  The wire was advanced into the left  intrahepatic system.  A cholangiogram was performed.  The gallbladder  and the cystic duct did not fill.  The right and left hepatic ducts were  mildly dilated.  The common duct was approximately 1-1.2 cm.  A  sphincterotomy was performed using initially pure-cut (200), and then  blended current at 200/25.  A large amount of bile, contrast, and Pies  fragments were seen after the sphincterotomy was complete.  The  sphinctertome was exchanged for a balloon catheter.  The balloon  catheter was introduced into the bile duct and inflated to 12 mm.  An  occlusive cholangiogram  was performed.  No stones were seen in the  proximal bile duct.  There appeared to be small filling defects in the  distal common bile duct.  The balloon was withdrawn approximately five  times.  Moderate amount of Sebree fragments and debris were obtained.  No  whole Feldhaus was removed.  The common duct was then cannulated with the  web extraction basket.  It was advanced into the mid common duct and  opened.  The web basket was withdrawn and a small amount of debris and  Norgren fragments were removed.  The web basket was passed twice.  The  pancreatic duct was not cannulated intentionally.  No bleeding was noted  after the sphincterotomy.  The patient was transferred to PACU,  recovered, and discharged to the floor in satisfactory condition.   I discussed the procedural findings with Dr. Eilene Ghazi, whose  number is [phone] (pager).      Kassie Mends, M.D.  Electronically Signed     SM/MEDQ  D:  02/17/2008  T:  02/17/2008  Job:  [id number]   cc:   attn:  Eilene Ghazi [facility]  [place], [place]

## 2011-02-26 NOTE — Group Therapy Note (Signed)
NAMECHRISTOFER, [name]                 ACCOUNT NO.:  [id number]   MEDICAL RECORD NO.:  [id number]          PATIENT TYPE:  INP   LOCATION:  A328                          FACILITY:  [facility]   PHYSICIAN:  Lucita Ferrara, MD         DATE OF BIRTH:  [birth year]   DATE OF PROCEDURE:  02/20/2008  DATE OF DISCHARGE:                                 PROGRESS NOTE   Patient examined by bedside postop day number one status post  laparoscopic cholecystectomy.  Now eating a regular diet, yet not  tolerating. He is not vomiting, but has some gas  His abdomen is  nondistended, but he has a lot of gas.   PHYSICAL EXAMINATION:  VITAL SIGNS:  Temperature is 98.3, his pulse is  83, respirations 20, blood pressure 144/73.  CBG is 183/204.  Pulse-ox  is 100% on room air.  HEENT:  Normocephalic, atraumatic.  CARDIOVASCULAR:  S1 S2.  ABDOMEN:  Soft.  There is minimal diffuse tenderness.  The dressing site  is clean, dry, and intact.  EXTREMITIES:  No clubbing, cyanosis, or edema.   ASSESSMENT AND PLAN:  1. Postoperative day number one status post cholecystectomy.  2. Jaundice, resolved.  3. Hypotension, resolved.   PLAN:  Will go ahead and get a KUB, continue current postop care,  incentive spirometry, watch for postop ileus, use nebulizer treatments,  pain control.  Rest of plan dependent on patient progress.      Lucita Ferrara, MD  Electronically Signed     RR/MEDQ  D:  02/20/2008  T:  02/20/2008  Job:  [id number]

## 2011-02-26 NOTE — Op Note (Signed)
[name], [name]                 ACCOUNT NO.:  [id number]   MEDICAL RECORD NO.:  [id number]          PATIENT TYPE:  INP   LOCATION:  A328                          FACILITY:  [facility]   PHYSICIAN:  Dalia Heading, M.D.  DATE OF BIRTH:  Feb 15, 1933   DATE OF PROCEDURE:  02/19/2008  DATE OF DISCHARGE:                               OPERATIVE REPORT   PREOPERATIVE DIAGNOSIS:  Cholecystitis, cholelithiasis.   POSTOPERATIVE DIAGNOSIS:  Cholecystitis, cholelithiasis.   PROCEDURE:  Laparoscopic cholecystectomy.   SURGEON:  Dalia Heading, MD   ANESTHESIA:  General endotracheal.   INDICATIONS:  The patient is a 76 year old white male status post an  ERCP for choledocholithiasis who now presents for laparoscopic  cholecystectomy for cholelithiasis.  The risks and benefits of the  procedure including bleeding, infection, hepatobiliary injury, and the  possibility of an open procedure were fully explained to the patient,  gave informed consent.   PROCEDURE NOTE:  The patient was placed in the supine position.  After  induction of general endotracheal anesthesia, the abdomen was prepped  and draped using the usual sterile technique with Betadine.  Surgical  site confirmation was performed.   A supraumbilical incision was made down to the fascia.  A Veress needle  was introduced into the abdominal cavity and confirmation of placement  was done using the saline drop test.  The abdomen was then insufflated  to 16 mmHg pressure.  A 11-mm trocar was introduced into the abdominal  cavity under direct visualization without difficulty.  The patient was  placed in reversed Trendelenburg position.  Additional 11-mm trocar was  placed in the epigastric region and 5-mm trocar was placed in the right  upper quadrant, right flank regions.  Liver was inspected and noted to  be within normal limits.  The gallbladder was retracted superiorly and  laterally.  The dissection was begun around the infundibulum  of the  gallbladder.  The cystic duct was first identified.  Its juncture to the  infundibulum fully identified.  A vascular Endo-GIA was placed across  the cystic duct and fired.  The cystic artery was likewise ligated and  divided.  The gallbladder was then freed away from the gallbladder fossa  using Bovie electrocautery.  The gallbladder was delivered through the  epigastric trocar site using EndoCatch bag.  The gallbladder fossa was  inspected.  No abnormal bleeding or bile leakage was noted.  Surgicel  was placed in the gallbladder fossa.  All fluid and air were then  evacuated from the abdominal cavity prior to removal of the trocars.   All wounds were irrigated with normal saline.  All wounds were injected  with 0.5% Sensorcaine.  The supraumbilical fascia as well as epigastric  fascia reapproximated using 0 Vicryl interrupted sutures.  All skin  incisions were closed using staples.  Betadine ointment and dry sterile  dressings were applied.   All tape and needle counts were correct at  the end of the procedure.  The patient was extubated in the operating room and went back to  recovery room awake in stable condition.  COMPLICATIONS:  None.   SPECIMEN:  Gallbladder with stones.   ESTIMATED BLOOD LOSS:  Minimal.      Dalia Heading, M.D.  Electronically Signed     MAJ/MEDQ  D:  02/19/2008  T:  02/20/2008  Job:  [id number]   cc:   Kassie Mends, M.D.  [address]  [place] , [place] [address]

## 2011-02-26 NOTE — Group Therapy Note (Signed)
[name], [name]                 ACCOUNT NO.:  [id number]   MEDICAL RECORD NO.:  [id number]          PATIENT TYPE:  INP   LOCATION:  A328                          FACILITY:  [facility]   PHYSICIAN:  Dorris Singh, DO    DATE OF BIRTH:  October 29, 1932   DATE OF PROCEDURE:  02/18/2008  DATE OF DISCHARGE:                                 PROGRESS NOTE   The patient was originally admitted to the service of [facility] with the  chief complaint of emesis and fever.  He was admitted with a biliary  duct Abeln and jaundice.  During his time, the patient has had an ERCP  and it was recommended that he see a Careers adviser, Dr. Lovell Sheehan.  According to  the patient, he apparently gets most of his care at the [place], and since  admission, we have been trying to transfer him to the [place] without any  luck.  It was determined at this point that, due to the patient's  condition and possible reoccurrence of this, that he may benefit from  having his surgery here so that is scheduled currently for May 8.  The  patient seen today doing well, has no current complaints.  There is a  COBRA that is fine for him to go to the [place] if he happens to go tonight.   Temperature 98.2, pulse 61, respirations 20, blood pressure 112/72.  The patient is a Caucasian male who is well-developed, well-nourished in  no acute distress.  Heart is regular rate and rhythm.  Lungs are clear to auscultation bilaterally.  Abdomen is soft, nontender and nondistended.  EXTREMITIES:  Positive pulses.   For labs today, he has none ordered.   ASSESSMENT AND PLAN:  1. Acute biliary stones biliary obstruction.  2. Jaundice.  3. Hypotension.   The patient seems to be doing better.  However, he is going to have  surgery tomorrow and await any further recommendations from Dr. Lovell Sheehan.      Dorris Singh, DO  Electronically Signed     CB/MEDQ  D:  02/18/2008  T:  02/18/2008  Job:  [id number]

## 2011-02-26 NOTE — Group Therapy Note (Signed)
NAMETIP, ATIENZA                 ACCOUNT NO.:  [id number]   MEDICAL RECORD NO.:  [id number]          PATIENT TYPE:  INP   LOCATION:  A328                          [facility]AC[facility]   PHYSICIAN:  Lucita Ferrara, MD         DATE OF BIRTH:  1933/05/28   DATE OF PROCEDURE:  DATE OF DISCHARGE:                                 PROGRESS NOTE   The patient is examined at bedside.  He is postop day #2 status post  arthroscopic cholecystectomy.  He is doing pretty well.  Still not  completely tolerating diet but no vomiting, some gas.   PHYSICAL EXAMINATION:  VITAL SIGNS:  Temperature 98.3, pulse 78,  respirations 20, blood pressure was 113/71, pulse ox 92% on room air.  CBGs 257, 275, 210.  HEENT:  Normocephalic, atraumatic.  Sclerae anicteric.  CARDIOVASCULAR:  S1 and S2.  ABDOMEN:  Soft, gassy, and somewhat distended.  Surgical site is clean,  dry, and intact.  EXTREMITIES:  No clubbing, cyanosis, or edema.   LABORATORY DATA:  Complete metabolic panel within normal limits, with  exception of increased glucose.  CBC white count 19, hemoglobin 13.9,  hematocrit 39.2, MCV 97.7, platelets 109, neutrophil count 92%.  KUB  showed probable mild adynamic ileus, mottled-appearing gas collection  near the cholecystectomy clips, although this may be bowel.  I cannot  exclude past operative hematoma or abscess.  Consider CT if clinical  suspicion.   ASSESSMENT AND PLAN:  Given the patient is not tolerating diet, given  KUB results, given increased white count, it could certainly be  secondary to reactive status post surgery but I cannot rule out an  abscess or infection.  The patient is afebrile.  However, I will go  ahead and proceed with a CT scan of the abdomen and pelvis without  contrast to reassess.  Will continue routine postop care for now.  The  rest of the plans will be dependent upon the patient's progress.      Lucita Ferrara, MD  Electronically Signed     RR/MEDQ  D:  02/21/2008  T:   02/21/2008  Job:  [id number]

## 2011-02-26 NOTE — Consult Note (Signed)
NAMEJAVELL, BLACKBURN                 ACCOUNT NO.:  [id number]   MEDICAL RECORD NO.:  [id number]          PATIENT TYPE:  INP   LOCATION:  A328                          FACILITY:  [facility]   PHYSICIAN:  Kassie Mends, M.D.      DATE OF BIRTH:  1933-07-21   DATE OF CONSULTATION:  02/16/2008  DATE OF DISCHARGE:                                 CONSULTATION   REASON FOR CONSULTATION:  Common bile duct Crock, jaundice.   PHYSICIAN REQUESTING CONSULTATION:  Edsel Petrin, M.D. with [facility].   HISTORY OF PRESENT ILLNESS:  Patient is a 77 year old Caucasian  gentleman who presented with a 2-day history of nausea, vomiting, temp  of 104 and jaundice.  He states he had been having vomiting for couple  of days.  His wife had actually been here in the hospital.  His daughter  went to check on him yesterday and noticed that he was jaundiced.  He  had a temp of 104.  He was brought to the emergency department for  evaluation.  He generally goes to go the [facility] in [place] for his medical  care.  He was just there last week after he fell and had hip pain.  According to the daughter they did x-rays, labs and everything was  normal.  He had also noticed that his urine was darker color.  Upon  evaluation here, he had a total bilirubin of 10.7, a white count 11,600.  His alkaline phosphatase was 310, SGOT 228, SGPT 223.  Platelets today  are 104,000.  His white count is up to 17,700.  His bilirubin is down to  8.5.  He really denies any abdominal pain.  No melena or rectal  bleeding.  He has chronic constipation and is suggestive having a mild  fecal impaction on CT.  There has been no hematemesis.  His current temp  is 99.7.  They have been having trouble with his blood pressures running  in the low 90s systolic.  He has received 2 boluses and his last  systolic was around 106.  His lowest systolic was at 78 this morning.  This is being closely monitored.   MEDICATIONS AT HOME:  1. Flunisolide nasal  daily.  2. Neurontin 300 mg b.i.d.  3. Metformin 850 mg b.i.d.  4. Naproxen 500 mg b.i.d.  5. Nitroglycerin 2.5 mg b.i.d.  6. Nystatin 100,000 units per mL twice daily.  7. Omeprazole 20 mg b.i.d.  8. OxyContin 500 mg every 6 hours as needed.  9. Senna at bedtime.  10.Zoloft 200 mg daily.  11.Sorbitol 1 to 2 tablespoons b.i.d.  12.Temazepam 30 mg at bedtime.  13.Nystatin/triamcinolone topical p.r.n.  14.Aspirin 325 mg daily.  15.Glyburide 5 mg b.i.d.  16.Simvastatin 80 mg at bedtime.  17.Colace 100 mg at bedtime.   ALLERGIES:  1. PENICILLIN and CORTISONE cause rash.  2. BETADINE.   PAST MEDICAL HISTORY:  1. Diabetes mellitus.  2. Osteoporosis.  3. TIAs.  4. Hypercholesterolemia.  5. Degenerative disk disease.  6. Bilateral knee surgery in the 70s with bone grafting.  7. It  is not clear whether he has had prior colonoscopy.  8. He has had an EGD and has a hiatal hernia according to the      daughter.   FAMILY HISTORY:  Negative for chronic GI illnesses, colorectal cancer.  His mother, several uncles and grandmother all had cancer but he is not  sure what type.   SOCIAL HISTORY:  He is retired, was in [employer], fought [place] War.  He  is married, has 2 children.  No tobacco use currently.  He used to chew  tobacco some, never really smoked.  No alcohol use.   REVIEW OF SYSTEMS:  See HPI for GI.  CONSTITUTIONAL:  No reported weight  loss.  CARDIOPULMONARY:  He denies chest pain or shortness of breath.  GENITOURINARY:  He has had darkening of his urine lately.   PHYSICAL EXAMINATION:  T max T current 99.7.  Pulse 69.  Respirations  20.  Blood pressure 111/60.  Height 75 inches.  Weight 101.2 kilograms.  GENERAL:  Pleasant elderly Caucasian gentleman in no acute distress.  He  appears somewhat lethargic but does answer questions appropriately.  SKIN:  Warm and dry, slight jaundice.  HEENT:  Sclerae are __________ .  __________ :  Regular rate and rhythm, normal S1, S2.   No murmurs, rubs  or gallops.  ABDOMEN:  Soft, nontender, nondistended.  No organomegaly or masses.  No  rebound or guarding.  No abdominal bruits or hernias.  LOWER EXTREMITIES:  No edema.   LABORATORIES:  White count 17,700, hemoglobin 13.7, sodium 140,  potassium 4, BUN 17, creatinine 1.01, INR 1.3, total bilirubin 8.5,  direct bilirubin 6.2, alkaline phosphatase 220, SGOT 200, SGPT 184,  albumin 2.8.  CT of the abdomen and pelvis revealed a CBD of 1.1 cm with  a probable common bile duct Netz[place].  There was borderline gallbladder  distention.  Small gallstones and sludge.  Pancreas appeared normal.  There was a left lower lobe nodule or pleural nodularity which needs to  be followed up with a chest CT as per radiologist.  Moderate stool in  the rectum.   IMPRESSION:  Patient is a 77 year old gentleman with obstructive  jaundice, nausea and vomiting, fever.  His common bile duct is 1.17 cm  with a probable common bile duct Hout per report.  He has borderline  gallbladder distention, and acute cholecystitis has not been excluded.  Suspect he has choledocholithiasis with cholangitis.  We need to check  his pancreatic enzymes, although pancreas appeared normal in study.  There has also been issues with hypotension which has currently been  managed by the hospitalist service.   RECOMMENDATIONS:  1. Check amylase and lipase.  2. Discontinue ultrasound of the abdomen.  3. MRCP per Dr. Cira Servant.  4. Per Dr. Cira Servant, we will go ahead and hold his Lovenox and aspirin      and stop his glyburide and Glucophage as the patient is n.p.o.  5. N.p.o.  6. Tap water enema per rectum.  7. SCD devices bilaterally.  8. I have discussed the above with the patient's daughter.  We will be      contacting the [place] in [place] regarding the patient to see if they      are wanting transfer to manage any potential procedures or      surgeries.  Further recommendation to follow.   I would like to thank Dr.  Skeet Latch for allowing [place] to take part  in the care of this  patient.      Tana Coast, P.A.      Kassie Mends, M.D.  Electronically Signed    LL/MEDQ  D:  02/16/2008  T:  02/16/2008  Job:  [id number]   cc:   Edsel Petrin, Dr.  [facility] Beets Team.

## 2011-04-24 ENCOUNTER — Emergency Department (HOSPITAL_COMMUNITY)
Admission: EM | Admit: 2011-04-24 | Discharge: 2011-04-24 | Disposition: A | Discharge status: Home / Self Care | Payer: Non-veteran care | Attending: Emergency Medicine | Admitting: Emergency Medicine

## 2011-04-24 ENCOUNTER — Encounter (HOSPITAL_COMMUNITY): Payer: Self-pay | Admitting: *Deleted

## 2011-04-24 DIAGNOSIS — E1169 Type 2 diabetes mellitus with other specified complication: Secondary | ICD-10-CM | POA: Insufficient documentation

## 2011-04-24 DIAGNOSIS — R4182 Altered mental status, unspecified: Secondary | ICD-10-CM | POA: Insufficient documentation

## 2011-04-24 DIAGNOSIS — Z8673 Personal history of transient ischemic attack (TIA), and cerebral infarction without residual deficits: Secondary | ICD-10-CM | POA: Insufficient documentation

## 2011-04-24 DIAGNOSIS — I10 Essential (primary) hypertension: Secondary | ICD-10-CM | POA: Insufficient documentation

## 2011-04-24 DIAGNOSIS — E162 Hypoglycemia, unspecified: Secondary | ICD-10-CM

## 2011-04-24 HISTORY — DX: Essential (primary) hypertension: I10

## 2011-04-24 LAB — DIFFERENTIAL
Basophils Absolute: 0 10*3/uL (ref 0.0–0.1)
Basophils Relative: 0 % (ref 0–1)
Eosinophils Absolute: 0 10*3/uL (ref 0.0–0.7)
Eosinophils Relative: 0 % (ref 0–5)
Monocytes Absolute: 0.8 10*3/uL (ref 0.1–1.0)

## 2011-04-24 LAB — BASIC METABOLIC PANEL
BUN: 18 mg/dL (ref 6–23)
Chloride: 102 mEq/L (ref 96–112)
Creatinine, Ser: 0.98 mg/dL (ref 0.50–1.35)
GFR calc Af Amer: >60 mL/min (ref >60)

## 2011-04-24 LAB — CBC
HCT: 42.3 % (ref 39.0–52.0)
MCHC: 33.6 g/dL (ref 30.0–36.0)
MCV: 98.6 fL (ref 78.0–100.0)
RDW: 14.3 % (ref 11.5–15.5)

## 2011-04-24 MED ORDER — SODIUM CHLORIDE 0.9 % IV SOLN
Freq: Once | INTRAVENOUS | Status: AC
  Administered 2011-04-24: 03:00:00 via INTRAVENOUS

## 2011-04-24 NOTE — ED Notes (Signed)
Pt states he has had blood sugar to drop in the past. Family tried to get him to eat peanut butter sandwich, but pt could not eat it so he was given a protein bar family states pt was not acting like himself at that time but he appears to back to normal now

## 2011-04-24 NOTE — ED Provider Notes (Signed)
History     Chief Complaint  Patient presents with  . Hypoglycemia   HPI Comments: Patient is known diabetic who has recently lost >50 pounds s/p CVA (residual right sided weakness). Was found at home to day with glucose of 28. EMS called who administered pineapple juice, mountain dew , peanut butter. Level of consciousness improved dramaticallly. Per family speech was slurred. Family was concerened he had another stroke. As blood sugar improved he returned to his baseline. Patient has had medicines changed since Feburary. No longer on Actos and metformin has been reduced to 500 mg BID. Continues on glyburide 2 BID.  Patient is a 77 y.o. male presenting with altered mental status. The history is provided by the patient and the spouse.  Altered Mental Status This is a new problem. The current episode started yesterday. The problem has been rapidly improving. Pertinent negatives include no chest pain, no abdominal pain, no headaches and no shortness of breath. The symptoms are aggravated by nothing. Treatments tried: given pineapple, mountain dew and peanut butter. The treatment provided moderate relief.    Past Medical History  Diagnosis Date  . Diabetes mellitus   . CVA (cerebral infarction)   . Hypertension     History reviewed. No pertinent past surgical history.  History reviewed. No pertinent family history.  History  Substance Use Topics  . Smoking status: Not on file  . Smokeless tobacco: Not on file  . Alcohol Use:       Review of Systems  Respiratory: Negative for shortness of breath.   Cardiovascular: Negative for chest pain.  Gastrointestinal: Negative for abdominal pain.  Neurological: Negative for headaches.  Psychiatric/Behavioral: Positive for altered mental status.  All other systems reviewed and are negative.    Physical Exam  BP 115/67  Pulse 84  Temp(Src) 97.5 F (36.4 C) (Oral)  Resp 18  Ht 6\' 3"  (1.905 m)  Wt 195 lb (88.451 kg)  BMI 24.37 kg/m2   SpO2 95%  Physical Exam  Constitutional: He is oriented to person, place, and time. He appears well-developed and well-nourished.  HENT:  Head: Normocephalic.  Right Ear: External ear normal.  Left Ear: External ear normal.  Nose: Nose normal.  Mouth/Throat: Oropharynx is clear and moist.  Eyes: Conjunctivae and EOM are normal.  Neck: Normal range of motion. Neck supple.  Cardiovascular: Normal rate, regular rhythm, normal heart sounds and intact distal pulses.   Pulmonary/Chest: Effort normal and breath sounds normal.  Abdominal: Soft. Bowel sounds are normal.  Musculoskeletal: Normal range of motion.  Neurological: He is alert and oriented to person, place, and time.       Right sided weakness.  Skin: Skin is warm and dry.    ED Course  Procedures  MDM       Nicoletta Dress. Colon Branch, MD 04/24/11 [phone]

## 2011-04-24 NOTE — ED Notes (Signed)
Pt arrived via [facility], initial cbg on scene 23, pt given food pta and final cbg 81, upon arrival in ed, pt slightly confused, no iv established

## 2011-04-24 NOTE — ED Notes (Signed)
Pt provided a regular drink, pack of crackers & peanut butter. Pt states they flooded him at home but he would take some sips of drink.

## 2011-04-24 NOTE — ED Notes (Signed)
[id number] 

## 2011-05-04 ENCOUNTER — Emergency Department (HOSPITAL_COMMUNITY): Payer: Medicare Other

## 2011-05-04 ENCOUNTER — Encounter (HOSPITAL_COMMUNITY): Payer: Self-pay | Admitting: *Deleted

## 2011-05-04 ENCOUNTER — Emergency Department (HOSPITAL_COMMUNITY)
Admission: EM | Admit: 2011-05-04 | Discharge: 2011-05-04 | Disposition: A | Discharge status: Home / Self Care | Payer: Medicare Other | Attending: Emergency Medicine | Admitting: Emergency Medicine

## 2011-05-04 DIAGNOSIS — I69959 Hemiplegia and hemiparesis following unspecified cerebrovascular disease affecting unspecified side: Secondary | ICD-10-CM | POA: Insufficient documentation

## 2011-05-04 DIAGNOSIS — Z79899 Other long term (current) drug therapy: Secondary | ICD-10-CM | POA: Insufficient documentation

## 2011-05-04 DIAGNOSIS — R4182 Altered mental status, unspecified: Secondary | ICD-10-CM | POA: Insufficient documentation

## 2011-05-04 DIAGNOSIS — I10 Essential (primary) hypertension: Secondary | ICD-10-CM | POA: Insufficient documentation

## 2011-05-04 DIAGNOSIS — R569 Unspecified convulsions: Secondary | ICD-10-CM | POA: Insufficient documentation

## 2011-05-04 DIAGNOSIS — E119 Type 2 diabetes mellitus without complications: Secondary | ICD-10-CM | POA: Insufficient documentation

## 2011-05-04 LAB — COMPREHENSIVE METABOLIC PANEL
Albumin: 3.2 g/dL — ABNORMAL LOW (ref 3.5–5.2)
BUN: 19 mg/dL (ref 6–23)
Chloride: 102 mEq/L (ref 96–112)
Creatinine, Ser: 0.8 mg/dL (ref 0.50–1.35)
GFR calc Af Amer: >60 mL/min (ref >60)
Glucose, Bld: 199 mg/dL — ABNORMAL HIGH (ref 70–99)
Total Bilirubin: 0.7 mg/dL (ref 0.3–1.2)
Total Protein: 6.4 g/dL (ref 6.0–8.3)

## 2011-05-04 LAB — URINALYSIS, ROUTINE W REFLEX MICROSCOPIC
Glucose, UA: NEGATIVE mg/dL
Leukocytes, UA: NEGATIVE
Nitrite: NEGATIVE
Protein, ur: NEGATIVE mg/dL

## 2011-05-04 LAB — CBC
HCT: 35.2 % — ABNORMAL LOW (ref 39.0–52.0)
Hemoglobin: 12.1 g/dL — ABNORMAL LOW (ref 13.0–17.0)
MCH: 33.9 pg (ref 26.0–34.0)
MCHC: 34.4 g/dL (ref 30.0–36.0)

## 2011-05-04 LAB — GLUCOSE, CAPILLARY: Glucose-Capillary: 204 mg/dL — ABNORMAL HIGH (ref 70–99)

## 2011-05-04 LAB — DIFFERENTIAL
Basophils Relative: 0 % (ref 0–1)
Eosinophils Absolute: 0.2 10*3/uL (ref 0.0–0.7)
Monocytes Absolute: 0.6 10*3/uL (ref 0.1–1.0)
Monocytes Relative: 7 % (ref 3–12)

## 2011-05-04 NOTE — ED Provider Notes (Signed)
History     Chief Complaint  Patient presents with  . Altered Mental Status   Patient is a 77 y.o. male presenting with altered mental status. The history is provided by the patient and the EMS personnel.  Altered Mental Status This is a new problem. The current episode started less than 1 hour ago. The problem occurs constantly. The problem has been gradually improving. The symptoms are aggravated by nothing. The symptoms are relieved by nothing. He has tried nothing (He was witnessed to have a seizure, and then seemed to stop breathing. EMS was called and found pateint awake, but slow to respond. This has been gradually improving. No incontinence or tongue/lip biting.) for the symptoms.    Past Medical History  Diagnosis Date  . Diabetes mellitus   . CVA (cerebral infarction)   . Hypertension     History reviewed. No pertinent past surgical history.  History reviewed. No pertinent family history.  History  Substance Use Topics  . Smoking status: Not on file  . Smokeless tobacco: Not on file  . Alcohol Use:       Review of Systems  Neurological: Positive for weakness.       Recent stroke with right hemiparesis.  Psychiatric/Behavioral: Positive for altered mental status.  All other systems reviewed and are negative.    Physical Exam  BP 111/67  Pulse 98  Temp(Src) 97.9 F (36.6 C) (Oral)  Resp 18  Ht 6\' 2"  (1.88 m)  Wt 200 lb (90.719 kg)  BMI 25.68 kg/m2  SpO2 87%  Physical Exam  Nursing note and vitals reviewed. Constitutional: He is oriented to person, place, and time. He appears well-developed and well-nourished.  HENT:  Head: Normocephalic and atraumatic.  Right Ear: External ear normal.  Left Ear: External ear normal.  Mouth/Throat: Oropharynx is clear and moist.  Eyes: EOM are normal. Pupils are equal, round, and reactive to light. No scleral icterus.  Neck: Normal range of motion. Neck supple. No JVD present.       No carotid bruit.    Cardiovascular: Normal rate, regular rhythm and normal heart sounds.   Pulmonary/Chest: Effort normal and breath sounds normal. He has no wheezes. He has no rales.  Abdominal: Soft. Bowel sounds are normal. He exhibits no mass. There is no tenderness.  Musculoskeletal: Normal range of motion. He exhibits no edema.  Lymphadenopathy:    He has no cervical adenopathy.  Neurological: He is alert and oriented to person, place, and time.       Moderate right facial droop and right hemiparesis present. He is awake, alert, and answers questions appropriately, follows commands appropriately. Speech is dysarthric, but fluent and appropriate.  Skin: Skin is warm and dry. No rash noted.  Psychiatric: He has a normal mood and affect.    ED Course  Procedures  Re-evaluation: he is more awake and speaking better. Family is here and state that he fell yesterday and injured his right knee. Otherwise, he is back to his baseline.  MDM New onset seizure - will need outpatient EEG. Rationale for no initial treatment discussed with family.  Results for orders placed during the hospital encounter of 05/04/11  CBC      Component Value Range   WBC 9.1  4.0 - 10.5 (K/uL)   RBC 3.57 (*) 4.22 - 5.81 (MIL/uL)   Hemoglobin 12.1 (*) 13.0 - 17.0 (g/dL)   HCT 16.1 (*) 09.6 - 52.0 (%)   MCV 98.6  78.0 - 100.0 (fL)  MCH 33.9  26.0 - 34.0 (pg)   MCHC 34.4  30.0 - 36.0 (g/dL)   RDW 04.5  40.9 - 81.1 (%)   Platelets 92 (*) 150 - 400 (K/uL)  DIFFERENTIAL      Component Value Range   Neutrophils Relative 79 (*) 43 - 77 (%)   Neutro Abs 7.1  1.7 - 7.7 (K/uL)   Lymphocytes Relative 12  12 - 46 (%)   Lymphs Abs 1.1  0.7 - 4.0 (K/uL)   Monocytes Relative 7  3 - 12 (%)   Monocytes Absolute 0.6  0.1 - 1.0 (K/uL)   Eosinophils Relative 3  0 - 5 (%)   Eosinophils Absolute 0.2  0.0 - 0.7 (K/uL)   Basophils Relative 0  0 - 1 (%)   Basophils Absolute 0.0  0.0 - 0.1 (K/uL)  COMPREHENSIVE METABOLIC PANEL      Component  Value Range   Sodium 139  135 - 145 (mEq/L)   Potassium 4.1  3.5 - 5.1 (mEq/L)   Chloride 102  96 - 112 (mEq/L)   CO2 24  19 - 32 (mEq/L)   Glucose, Bld 199 (*) 70 - 99 (mg/dL)   BUN 19  6 - 23 (mg/dL)   Creatinine, Ser 9.14  0.50 - 1.35 (mg/dL)   Calcium 8.8  8.4 - 78.2 (mg/dL)   Total Protein 6.4  6.0 - 8.3 (g/dL)   Albumin 3.2 (*) 3.5 - 5.2 (g/dL)   AST 15  0 - 37 (U/L)   ALT 9  0 - 53 (U/L)   Alkaline Phosphatase 63  39 - 117 (U/L)   Total Bilirubin 0.7  0.3 - 1.2 (mg/dL)   GFR calc non Af Amer >60  >60 (mL/min)   GFR calc Af Amer >60  >60 (mL/min)  URINALYSIS, ROUTINE W REFLEX MICROSCOPIC      Component Value Range   Color, Urine YELLOW  YELLOW    Appearance CLEAR  CLEAR    Specific Gravity, Urine >1.030 (*) 1.005 - 1.030    pH 5.5  5.0 - 8.0    Glucose, UA NEGATIVE  NEGATIVE (mg/dL)   Hgb urine dipstick NEGATIVE  NEGATIVE    Bilirubin Urine NEGATIVE  NEGATIVE    Ketones, ur TRACE (*) NEGATIVE (mg/dL)   Protein, ur NEGATIVE  NEGATIVE (mg/dL)   Urobilinogen, UA 0.2  0.0 - 1.0 (mg/dL)   Nitrite NEGATIVE  NEGATIVE    Leukocytes, UA NEGATIVE  NEGATIVE   GLUCOSE, CAPILLARY      Component Value Range   Glucose-Capillary 204 (*) 70 - 99 (mg/dL)   Comment 1 Documented in Chart     Comment 2 Notify RN     Dg Chest 1 View  05/04/2011  *RADIOLOGY REPORT*  Clinical Data: Seizure  CHEST - 1 VIEW  Comparison: 12/14/2010  Findings: Atherosclerotic calcification and tortuosity of the aortic arch noted.  Reverse lordotic positioning noted. The patient is rotated to the left on today's exam, resulting in reduced diagnostic sensitivity and specificity.  Indistinct pulmonary vasculatures suggests pulmonary venous hypertension.  Airway thickening may reflect bronchitis or reactive airways disease.  No airspace opacity is identified to suggest bacterial pneumonia pattern.  IMPRESSION:  1. Airway thickening may reflect bronchitis or reactive airways disease.  No airspace opacity is identified to  suggest bacterial pneumonia pattern. 2.  Atherosclerosis. 3.  Mild right basilar subsegmental atelectasis.  Original Report Authenticated By: Dellia Cloud, M.D.   Ct Head Wo Contrast  05/04/2011  *RADIOLOGY  REPORT*  Clinical Data: Altered mental status.  CT HEAD WITHOUT CONTRAST  Technique:  Contiguous axial images were obtained from the base of the skull through the vertex without contrast.  Comparison: 12/14/2010  Findings: The cerebellum and brain stem appear unremarkable, as do the thalami.  Calcification of the globus pallidus nuclei noted, with a stable infarcts involving the left lentiform nucleus and left periventricular white matter.  Periventricular and corona radiata white matter hypodensities are most compatible with chronic ischemic microvascular white matter disease.  No intracranial hemorrhage, mass lesion, or acute infarction is identified.  IMPRESSION:  1.  Old left basal ganglia and white matter strokes.  No acute findings are observed. 2.  Periventricular and corona radiata white matter hypodensities are most compatible with chronic ischemic microvascular white matter disease.  Original Report Authenticated By: Dellia Cloud, M.D.   Dg Knee Complete 4 Views Right  05/04/2011  *RADIOLOGY REPORT*  Clinical Data: Fall with right knee pain.  RIGHT KNEE - COMPLETE 4+ VIEW  Comparison: None.  Findings: Advanced osteoarthritis present of the right knee with near complete loss of medial joint space height.  No evidence of acute fracture or dislocation.  No joint effusion identified. There is relative patella alta.  Atherosclerotic calcification present in the visualized popliteal artery and distal SFA.  IMPRESSION: Osteoarthritis with the medial joint space loss.  Original Report Authenticated By: Reola Calkins, M.D.          Dione Booze, MD 05/04/11 1537

## 2011-05-04 NOTE — ED Notes (Addendum)
ems report pts family told them pt fell yesterday hitting head. Per family pt now slurring his speech and not himself. ems reports pts family states pt was talking this am and suddenly stopped talking and then began  A full body shaking. Pt now talking and answering questions.

## 2011-06-11 ENCOUNTER — Ambulatory Visit (HOSPITAL_COMMUNITY)
Admission: RE | Admit: 2011-06-11 | Discharge: 2011-06-11 | Disposition: A | Discharge status: Home / Self Care | Payer: Medicare Other | Source: Ambulatory Visit | Attending: Pathology | Admitting: Pathology

## 2011-06-11 DIAGNOSIS — M6281 Muscle weakness (generalized): Secondary | ICD-10-CM | POA: Insufficient documentation

## 2011-06-11 DIAGNOSIS — IMO0001 Reserved for inherently not codable concepts without codable children: Secondary | ICD-10-CM | POA: Insufficient documentation

## 2011-06-11 DIAGNOSIS — R262 Difficulty in walking, not elsewhere classified: Secondary | ICD-10-CM | POA: Insufficient documentation

## 2011-06-11 DIAGNOSIS — E119 Type 2 diabetes mellitus without complications: Secondary | ICD-10-CM | POA: Insufficient documentation

## 2011-06-12 ENCOUNTER — Ambulatory Visit (HOSPITAL_COMMUNITY)
Admission: RE | Admit: 2011-06-12 | Discharge: 2011-06-12 | Disposition: A | Discharge status: Home / Self Care | Payer: Medicare Other | Source: Ambulatory Visit | Attending: Pathology | Admitting: Pathology

## 2011-06-12 NOTE — Progress Notes (Signed)
Physical Therapy Treatment Patient Details  Name: [name] MRN: [id number] Date of Birth: [birth year]  Today's Date: 06/12/2011 Time: 7829-5621 Time Calculation (min): 35 min Charges:  Visit#: 2 of 8 Re-eval: 07/11/11  Subjective: Symptoms/Limitations Symptoms: Pt reports he was able to go walk outside with his aide this morning since his knees were not hurting to bad today.    Precautions/Restrictions     Mobility (including Balance) Transfers Transfers: Yes Sit to Stand: 4: Min assist Sit to Stand Details (indicate cue type and reason): Cueing for proper position/sequence and hand placement x5.  Ambulation/Gait Ambulation/Gait: Yes Ambulation/Gait Assistance: 6: Modified independent (Device/Increase time) Ambulation Distance (Feet): 500 Feet (10 minutes) Assistive device: Rolling walker Gait Pattern: Decreased stride length     Exercise/Treatments Gait x10 minutes w/RW Supervision for sequencing  Standing:  5 Sit to Stands   Functional Squats 2x10  Heel Raises 2x10  BLE Hip Extension x10  BLE Hip Abduction x10  Lateral Step Ups: BLE 10x 2 in. step Seated:  Heel Raises x10  Toe Raises 2x10  Marching 2x1'  Physical Therapy Assessment and Plan PT Assessment and Plan Clinical Impression Statement: Pt tolerated all activities and ther-ex well today without an increase in pain.  Pt was mildly limited by activity tolerance requiring multiple rest breaks.   PT Plan: Add balance activities when ready.      Goals    Problem List Patient Active Problem List  Diagnoses  . Muscle weakness (generalized)  . Difficulty in walking    PT - End of Session Activity Tolerance: Patient tolerated treatment well  LISA MASSIE 06/12/2011, 3:19 PM

## 2011-06-12 NOTE — Progress Notes (Signed)
Physical Therapy Evaluation  Patient Details  Name: [name]  MRN: [id number]  Date of Birth: 1933/05/14  Today's Date: 06/11/2011  Time: 7829-5621  Time Calculation (min): 49 min  Charges: 1 eval  Visit#: 1 of 8  Re-eval: 07/11/11  Past Medical History:  Past Medical History   Diagnosis  Date   .  Diabetes mellitus    .  CVA (cerebral infarction)    .  Hypertension     Subjective  Symptoms/Limitations  Symptoms: LLE weakness secondary to stroke on 12/01/10  How long can you walk comfortably?: 5 minutes w/RW  Assessment  RLE Strength  Right Hip Flexion: 4/5  Right Hip Extension: 2+/5  Right Hip ABduction: 4/5  Right Hip ADduction: 4/5  Right Knee Flexion: 3/5  Right Knee Extension: 3/5  LLE Strength  Left Hip Flexion: 3/5  Left Hip Extension: 2+/5  Left Hip ABduction: 4/5  Left Hip ADduction: 4/5  Left Knee Flexion: 2+/5  Left Knee Extension: 3/5  Mobility (including Balance)   Static Standing Balance  Static Standing - Balance Support: Right upper extremity supported  Static Standing - Level of Assistance: 5: Stand by assistance  Single Leg Stance - Right Leg: 0  Single Leg Stance - Left Leg: 0  Tandem Stance - Right Leg: 0  Tandem Stance - Left Leg: 0  Rhomberg - Eyes Opened: 10  Rhomberg - Eyes Closed: 10  Exercise/Treatments  Standing  Functional Squats 10x in RW  Heel Raises 10x in RW   Supine  BLE SLR x10  Bridging x10  BLE SAQ x10  Physical Therapy Assessment and Plan  PT Assessment and Plan  Clinical Impression Statement: Pt is a 77 year old male referred to PT secondary to CVA. After examination it was found that the patient has current body structure impairments including difficulty walking, increased pain, decreased functional ROM, decreased functional power, decreased strength, and impaired balance which are limiting his ability to participate in household and community activities. Pt will benefit from skilled outpatient PT in order to address  the above impairments in order to maximize safety and function.  Rehab Potential: Fair  PT Frequency: Min 2X/week  PT Duration: 4 weeks  PT Treatment/Interventions: Gait training;Stair training;Functional mobility training;Therapeutic exercise;Balance training;Neuromuscular re-education;Patient/family education;Other (comment) (Manual and modalities for pain control)  PT Plan: Add: gait training, Toe/heel raises seated, Sit to stand ther-ex, steps, balance when ready.  Goals  PT Short Term Goals  Time to Complete Goals: 2 weeks  PT Short Term Goal 1: Pt will be independent with HEP in order to maximize therapeutic effect  PT Short Term Goal 2: Pt will perform TUG test and Berg Test  PT Short Term Goal 3: Pt will tandem stance x10 sec on static surface with stand by assitance  PT Short Term Goal 4: Pt will improve BLE strength by 1 muscle grade.  PT Long Term Goals  PT Long Term Goal 1: Pt will ambulate with supervision with SPC x 5 minutes for safe household ambulation.  PT Long Term Goal 2: Pt will improve BLE strength in order to ascend and descend 15 stairs with 1 handrail with lateral approach in order to safely enter [place] home.  Long Term Goal 3: Pt will improve LE power and demonstrate sit to stand independently with UE support.  Problem List  Patient Active Problem List   Diagnoses   .  Muscle weakness (generalized)   .  Difficulty in walking    PT -  End of Session  Activity Tolerance: Patient limited by pain

## 2011-06-13 NOTE — Progress Notes (Signed)
Pt has been approved for 16 PT visits from August 16-August 23, 2011  Clinical Physical Therapy Evaluation   Patient: [name] Initial Eval Date: 06/11/11  Physician: [place]/Rosenthal DOB: 2032/12/11  Age: 77 Diagnosis: Stroke   Onset Date: 12/01/10 Dx Code: 719.7, 728.87  Employer:  Date of Surgery: N/A  Occupation: Retired  Case Manager: N/A  Next MD visit: 06/25/11 Claim #: N/A   HISTORY: Pt is a 77 year old male referred to PT s/p CVA with generalized muscle weakness.  His wife is present with him during his evaluation.  He reports that he has RLE and RUE weakness.  He reports he has fallen 8 times while he has been at home.    MEDICAL HISTORY:  Significant for TIA, DM, hyperlipidemia, anxiety, heart aneurism, 1 seizure a couple of weeks ago. Bilateral knee surgery and was placed on disability  PREVIOUS THERAPY: SNF x3 months, HHPT 3-4 weeks Cognitive Status: Pt is alert and oriented x 4 Social History: Lives with his wife, enjoys going into the community for church.  Current Functional Status: Ambulates with RW 5 minutes, stand: 5 minutes, sit: unlimited, unable to go up and down stairs.  Hospital bed at home w/trapeze above Prior Functional Status: Pt was ambulating with a cane for outside in the community, independent with household mobility. Walk: 10 minutes, Stand: 10 minutes Barriers to Education:   SUBJECTIVE:  PATIENT GOAL: "I want to be able to walk with a cane again and get around better with my R leg and R arm." Pt reports that he would like [place]. PAIN RATING:  OBJECTIVE: POSTURE: Slouched with posterior rotated pelvis.  OBSERVATION: Significant for Bilateral knee pain with increased tibial tubercle on the LLE.   AROM:  Decreased functional bilateral knee flexion and hip flexion  STRENGTH:  Muscle Right Left  Hip flexion 4/5 3/5  Hip extension 2+/5 2+/5  Gluteus Medius 4/5 4/5  Adductors 4/5 4/5  Knee extension 3/5  3/5  Knee Flexion 3/5 3/5   NEUROLOGIC: clear for rapid movements and negative for over and undershooting.  Sensation decreased to RLE.  GAIT: Ambulates with RW w/BLE knee braces on.  Gait Pattern: Decreased RLE: Stance, hip flexion, knee flexion, heel and toe.  FUNCTION: See above: Unable to go from sit to stand without assistance.  Balance: Static Stance and stance w/eyes closed x10 sec w/supervision Tandem Stance and Single Leg Stance unable to perform  PALPATION:   CLINICAL ASSESSMENT: Pt is an 77 y.o.male referred to PT s/p stroke with significant LE PMH.  After examination it was found that the patient has current body structure impairments including increased knee pain, decreased power, decreased strength, decreased flexibility, impaired balance, decreased activity tolerance and difficulty walking which are interfering with his community and household activities.  Pt will benefit from skilled PT in order to address the above impairments in order to maximize function and improve quality of life.      TREATMENT DIAGNOSIS: Generalized Muscle Weakness: 728.87, Difficulty walking 719.7 Rehab Potential: Good PLAN: 1. Therapeutic exercise to include stretching, strengthening and HEP. 2. Gait training 3. Balance re-education 4. Manual techniques and modalities as needed for pain reduction.  FREQUENCY / DURATION: 2x/week x 4 weeks. SHORT TERM GOALS: to be met in 2 weeks. Patient will be able to: 1: Pt will be independent with HEP in order to maximize therapeutic effect  2: Pt will perform TUG test and Sharlene Motts  Test  3: Pt will tandem stance x10 sec on static surface with stand by assistance  4: Pt will improve BLE strength by 1 muscle grade.  LONG TERM GOALS: to be met in 4 weeks. Patient will be able to: 1: Pt will ambulate with supervision with SPC x 5 minutes for safe household ambulation.  2: Pt will improve BLE strength in order to ascend and descend 15 stairs with 1 handrail with  lateral approach in order to safely [place].  3: Pt will improve LE power and demonstrate sit to stand independently with UE support.   INITIAL TREATMENT: Initial evaluation, therapeutic exercise and education in HEP. Pt agreeable to treatment plan PRECAUTIONS: N/A. CONTRAINDICATIONS: N/A. Thank you for your referral  _____________________________  _______________________________    Annett Fabian, PT           Date                         Physician Treatment Plan Checklist Your signature is required to indicate approval of the treatment plan as stated above.  Please make a copy of this report for your files and return this physician signed original in the self-addressed envelope. PLEASE CHECK ONE: _____ 1. APPROVE PLAN _____2. APPROVE PLAN WITH THE FOLLOWING CHANGES: ______ __________________________________________________________________________________ ________________________________   __________________________ PHYSICIAN SIGNATURE       DATE

## 2011-06-20 ENCOUNTER — Ambulatory Visit (HOSPITAL_COMMUNITY)
Admission: RE | Admit: 2011-06-20 | Discharge: 2011-06-20 | Disposition: A | Discharge status: Home / Self Care | Payer: Medicare Other | Source: Ambulatory Visit | Attending: Internal Medicine | Admitting: Internal Medicine

## 2011-06-20 DIAGNOSIS — IMO0001 Reserved for inherently not codable concepts without codable children: Secondary | ICD-10-CM | POA: Insufficient documentation

## 2011-06-20 DIAGNOSIS — R262 Difficulty in walking, not elsewhere classified: Secondary | ICD-10-CM | POA: Insufficient documentation

## 2011-06-20 DIAGNOSIS — M6281 Muscle weakness (generalized): Secondary | ICD-10-CM | POA: Insufficient documentation

## 2011-06-20 DIAGNOSIS — E119 Type 2 diabetes mellitus without complications: Secondary | ICD-10-CM | POA: Insufficient documentation

## 2011-06-20 NOTE — Progress Notes (Signed)
Physical Therapy Treatment Patient Details  Name: [name] MRN: [id number] Date of Birth: April 15, 1933  Today's Date: 06/20/2011 Time: 9147-8295 Time Calculation (min): 38 min Visit#: 3 of 8 Re-eval: 07/11/11 Charge: therex 31 min Gait 5 min   Subjective: Symptoms/Limitations Symptoms: Pt reports pain B knees 6-7/10.  Reports extreme diff with sit to stand because of knee pain Pain Assessment Currently in Pain?: Yes Pain Score:   7 Pain Location: Knee Pain Orientation: Right;Left  Precautions/Restrictions     Mobility (including Balance)       Exercise/Treatments Standing:  Gait training with RW Functional Squats 10x in front of sink  Heel Raises 10x in front of sink Toe raises 10 x in front of sink Sit to stand with UE A 5x (unable to fully stand for 2 attempts of the 5)  Supine  BLE SLR x10  Bridging x10  BLE SAQ x10    Physical Therapy Assessment and Plan PT Assessment and Plan Clinical Impression Statement: Pt tolerance limited by pain with knee therex.  Pt needed max UE A with sit to stand, unable to stand fully with 2 out of the 5 attempts.  Required multiple rest breaks secondary to decreased endurance and increased pain.   PT Plan: Begin balance activities next session.    Goals    Problem List Patient Active Problem List  Diagnoses  . Muscle weakness (generalized)  . Difficulty in walking    PT - End of Session Activity Tolerance: Patient limited by pain;Patient tolerated treatment well General Behavior During Session: [facility] for tasks performed Cognition: [facility] for tasks performed  Juel Burrow 06/20/2011, 7:24 PM

## 2011-06-21 ENCOUNTER — Ambulatory Visit (HOSPITAL_COMMUNITY)
Admission: RE | Admit: 2011-06-21 | Discharge: 2011-06-21 | Disposition: A | Discharge status: Home / Self Care | Payer: Medicare Other | Source: Ambulatory Visit | Attending: Physical Therapy | Admitting: Physical Therapy

## 2011-06-21 NOTE — Progress Notes (Signed)
Physical Therapy Treatment Patient Details  Name: [name] MRN: [id number] Date of Birth: 07/30/1933  Today's Date: 06/21/2011 Time: 1324-4010 Time Calculation (min): 37 min Visit#: 4 of 8 Re-eval: 07/11/11 Charge: therex 32 min Gait: 5 min  Subjective: Symptoms/Limitations Symptoms: 8/10 B knee muscle soreness  Objective: entered amb with RW, began gait training with quad cane  Exercise/Treatments  Standing:  Gait training with quad cane Heel Raises 10x  Toe raises 10 x  Tandem gait 1RT  with RW,  quad cane Retro gait 1 RT with quad cane Supine  BLE SLR x10  Bridging x10  BLE SAQ x10  Physical Therapy Assessment and Plan PT Assessment and Plan Clinical Impression Statement: Began balance activities with AD required for pt comfort.  Min A required with 3 LOB episodes during tandem and retro gait.  Held sit to stand secondary to pt's complaints of knee pain.  Gait trainaing with quad cane with vc for sequence. PT Plan: Progress strength and balance.    Goals    Problem List Patient Active Problem List  Diagnoses  . Muscle weakness (generalized)  . Difficulty in walking    PT - End of Session Activity Tolerance: Patient tolerated treatment well General Behavior During Session: [facility] for tasks performed Cognition: [facility] for tasks performed  Juel Burrow 06/21/2011, 3:56 PM

## 2011-06-27 ENCOUNTER — Ambulatory Visit (HOSPITAL_COMMUNITY)
Admission: RE | Admit: 2011-06-27 | Discharge: 2011-06-27 | Disposition: A | Discharge status: Home / Self Care | Payer: Medicare Other | Source: Ambulatory Visit | Attending: Pathology | Admitting: Pathology

## 2011-06-27 NOTE — Progress Notes (Signed)
Physical Therapy Treatment Patient Details  Name: [name] MRN: [id number] Date of Birth: 1932-12-27  Today's Date: 06/27/2011 Time: 4098-1191 Time Calculation (min): 50 min Visit#: 5 of 8 Re-eval: 07/11/11 Charges:  Therex:  36', Gait 8'    Subjective:  Pt. Reports compliance with HEP but hasn't done much since last visit due to soreness.  Pt. States his back hurts worse than his Knees.  8/10 knee pain reported.   Mobility (including Balance) Bed Mobility Bed Mobility: No Transfers Transfers: Yes Sit to Stand: 6: Modified independent (Device/Increase time) Sit to Stand Details (indicate cue type and reason): Can stand Mod Independent if uses UE and flexes forward; if done with better posture, requires Mod Assist Ambulation/Gait Ambulation/Gait: Yes Ambulation/Gait Assistance: 4: Min assist Ambulation/Gait Assistance Details (indicate cue type and reason): SPC, Requires VC's/manual cues to advance SPC correctly, sequencing and posture. Ambulation Distance (Feet): 150 Feet (80' X1, 40'X1, 30'X1) Assistive device: Straight cane (Pt. not comfortable with quad cane) Gait Pattern: Decreased stride length;Trunk flexed Gait velocity: normal, cautious     Exercise/Treatments Standing:  Gait training with SPC Heel Raises 15x  Toe raises 15 x  Squats 10X Tandem gait 1RT with SPC  Retro gait 1/2 RT with SPC Seated: Sit-stands 4X using UE's, correct posture.  Supine  BLE SLR x10  Pt. requires AA with R LE. Bridging x10  BLE SAQ x10   Physical Therapy Assessment and Plan PT Assessment and Plan Clinical Impression Statement: Pt. complained of increasing back pain with increasing activity.  B LE's seem to be getting stronger, with increased functional activity tolerance.  Able to complete tandem/retro gait using SPC today, with min-mod assist due to occasional LOB. PT Treatment/Interventions: Balance training;Gait training;Therapeutic exercise PT Plan: Continue to progress  strength, balance and activity tolerance.     Problem List Patient Active Problem List  Diagnoses  . Muscle weakness (generalized)  . Difficulty in walking    PT - End of Session Equipment Utilized During Treatment: Gait belt (B Knee braces) Activity Tolerance: Patient tolerated treatment well General Behavior During Session: [facility] for tasks performed Cognition: [facility] for tasks performed  Emeline Gins B 06/27/2011, 5:28 PM

## 2011-06-28 ENCOUNTER — Ambulatory Visit (HOSPITAL_COMMUNITY)
Admission: RE | Admit: 2011-06-28 | Discharge: 2011-06-28 | Disposition: A | Discharge status: Home / Self Care | Payer: Medicare Other | Source: Ambulatory Visit | Attending: Internal Medicine | Admitting: Internal Medicine

## 2011-06-28 NOTE — Progress Notes (Signed)
Physical Therapy Treatment Patient Details  Name: MAISEN KLINGLER MRN: [id number] Date of Birth: 09/09/1933  Today's Date: 06/28/2011 Time: 4098-1191 Time Calculation (min): 45 min Visit#: 6 of 8 Re-eval: 07/11/11  Charge: therex: 23 min Neuro re-ed 14 min Gait 8  Subjective: Symptoms/Limitations Symptoms: Knees bother me when they are bent, they are fine when I'm standing with them straight.  I'm sore today. Pain Assessment Currently in Pain?: Yes Pain Location: Knee Pain Orientation: Right;Left  Precautions/Restrictions     Mobility (including Balance)       Exercise/Treatments Gait training with SPC 8 min Marching 10x each Heel Raises 2x 10 Toe raises 2x 10  Squats 2x 10 with manual assistance to reduce knee stress Tandem gait 1RT with no AD  Retro gait 1 RT with no AD  Seated:  Sit-stands 4X using UE's, correct posture.  Supine  BLE SLR 2x10 Pt. requires AA with R LE.  Bridging x15    Physical Therapy Assessment and Plan PT Assessment and Plan Clinical Impression Statement: Pt making good progress with increased functional ability tolerance, improved endurance, less rest breaks required during session.  Able to increase sets with no increased pain stated.  Tandem/retro gait completed with no AD, occasional LOB, able to regain balance independently with min assist. PT Plan: Continue to progress strength, balance and activity tolerance    Goals    Problem List Patient Active Problem List  Diagnoses  . Muscle weakness (generalized)  . Difficulty in walking    PT - End of Session Activity Tolerance: Patient tolerated treatment well General Behavior During Session: [facility] for tasks performed Cognition: [facility] for tasks performed  Juel Burrow 06/28/2011, 6:38 PM

## 2011-07-02 ENCOUNTER — Ambulatory Visit (HOSPITAL_COMMUNITY)
Admission: RE | Admit: 2011-07-02 | Discharge: 2011-07-02 | Disposition: A | Discharge status: Home / Self Care | Payer: Medicare Other | Source: Ambulatory Visit | Attending: Pathology | Admitting: Pathology

## 2011-07-02 NOTE — Progress Notes (Signed)
Physical Therapy Treatment Patient Details  Name: [name] MRN: [id number] Date of Birth: [birth year]  Today's Date: 07/02/2011 Time: 4098-1191 Time Calculation (min): 44 min Visit#: 7  of 8   Re-eval: 07/11/11 Charges:  Gait: 10', NMR 20', Therex 10'    Subjective: Symptoms/Limitations Symptoms: Pt. states the rain is making his knee pain worse today.  Hurts worse coming from sit to stand, especially the right one. Pain Assessment Currently in Pain?: Yes Pain Score:   7 Pain Location: Knee Pain Orientation: Right;Left   Mobility (including Balance) Bed Mobility Bed Mobility: No Transfers Transfers: Yes Sit to Stand: 6: Modified independent (Device/Increase time) Ambulation/Gait Ambulation/Gait: Yes Ambulation/Gait Assistance: 4: Min assist Ambulation/Gait Assistance Details (indicate cue type and reason): SPC, improved sequencing and posture today needing less VC's Ambulation Distance (Feet): 120 Feet Assistive device: Straight cane Gait Pattern: Decreased stride length     Exercise/Treatments Gait training with SPC 10 min  Heel Raises 2x 10  Toe raises 2x 10  Squats 2x 10 with manual assistance to reduce knee stress  Tandem gait 2RT with SPC  Retro gait 1 RT with no AD  Seated:  Sit-stands 4X using UE's, correct posture.  Supine  BLE SLR 2x10 Pt. requires AA with R LE.  Bridging x15  Physical Therapy Assessment and Plan PT Assessment and Plan Clinical Impression Statement: Improved activity tolerance, gait quality and sequencing today.   PT Treatment/Interventions: Balance training;Gait training;Therapeutic exercise PT Plan: Re-eval next visit per 7/8 visit today.    Problem List Patient Active Problem List  Diagnoses  . Muscle weakness (generalized)  . Difficulty in walking    PT - End of Session Equipment Utilized During Treatment: Gait belt (B knee braces) Activity Tolerance: Patient tolerated treatment well General Behavior During Session:  [facility] for tasks performed Cognition: [facility] for tasks performed  Emeline Gins B 07/02/2011, 4:55 PM

## 2011-07-04 ENCOUNTER — Ambulatory Visit (HOSPITAL_COMMUNITY)
Admission: RE | Admit: 2011-07-04 | Discharge: 2011-07-04 | Disposition: A | Discharge status: Home / Self Care | Payer: Medicare Other | Source: Ambulatory Visit | Attending: Pathology | Admitting: Pathology

## 2011-07-04 NOTE — Progress Notes (Signed)
Physical Therapy Progress Note  Patient Details  Name: [name] MRN: [id number] Date of Birth: 02-May-1933  Today's Date: 07/04/2011 Time: 1430-1520 Time Calculation (min): 50 min Visit#: 8  of 16   Charges: 30' PPT, 10' TA, 1 MMT Re-eval:   Assessment Diagnosis: S/P stroke, generalized weakness Next MD Visit: Johnnette Litter  Past Medical History:  Past Medical History  Diagnosis Date  . Diabetes mellitus   . CVA (cerebral infarction)   . Hypertension    Past Surgical History: No past surgical history on file.  Subjective Symptoms/Limitations Symptoms: Pt reports this morning he was feeling very weak.  Overall he reports he is doing pretty well.  He reports he is about 40% better.  How long can you walk comfortably?: 10 minutes (5 min on 06/11/11) Pain Assessment Pain Score:   8 Pain Location: Knee Pain Orientation: Right;Left  Assessment RLE Strength Right Hip Flexion: 3+/5 Right Hip ABduction: 5/5 Right Hip ADduction: 5/5 Right Knee Flexion: 3/5 Right Knee Extension: 3/5 LLE Strength Left Hip Flexion: 5/5 Left Hip ABduction: 5/5 Left Hip ADduction: 5/5 Left Knee Flexion: 5/5 Left Knee Extension: 3+/5  Mobility (including Balance) Ambulation/Gait Ambulation/Gait: Yes  Balance Balance Assessed: Yes Static Standing Balance Tandem Stance - Right Leg: 5  Tandem Stance - Left Leg: 10  Dynamic Standing Balance Standardized Balance Assessment: Berg Balance Test;Timed Up and Go Test Berg Balance Test Sit to Stand: Able to stand  independently using hands Standing Unsupported: Able to stand safely 2 minutes Sitting with Back Unsupported but Feet Supported on Floor or Stool: Able to sit safely and securely 2 minutes Stand to Sit: Controls descent by using hands Transfers: Able to transfer safely, definite need of hands Standing Unsupported with Eyes Closed: Able to stand 10 seconds safely Standing Ubsupported with Feet Together: Able to place feet together  independently and stand for 1 minute with supervision From Standing, Reach Forward with Outstretched Arm: Can reach confidently >25 cm (10") From Standing Position, Pick up Object from Floor: Unable to pick up shoe, but reaches 2-5 cm (1-2") from shoe and balances independently From Standing Position, Turn to Look Behind Over each Shoulder: Turn sideways only but maintains balance Turn 360 Degrees: Able to turn 360 degrees safely but slowly Standing Unsupported, Alternately Place Feet on Step/Stool: Able to complete >2 steps/needs minimal assist Standing Unsupported, One Foot in Front: Needs help to step but can hold 15 seconds Standing on One Leg: Unable to try or needs assist to prevent fall Total Score: 36  Timed Up and Go Test TUG: Normal TUG Normal TUG (seconds): 50.1   Exercise/Treatments Sit to stand x7 throughout treatment for activity tolerance and sequence [place] w/1 handrail w/lateral approach 2x5 - instruction for wife    Physical Therapy Assessment and Plan PT Assessment and Plan Clinical Impression Statement: Mr. Goetzke has attended PT for 4 weeks and has made significant gains in functional strength, balance, activity tolerance and walks with a cane, and overall functional ability.  He continues to have impairments including impaired dynamic balance, decreased gait speed, and difficulty with activities including sit <>stand secondary to bilateral knee OA which continues to increase his risk of falling.  he will continue to benefit from skilled outpatient services to address the above impairments in order to maximize function.  Rehab Potential: Good Clinical Impairments Affecting Rehab Potential:  impaired dynamic balance, decreased gait speed, and difficulty with activities including sit <>stand secondary to bilateral knee OA which continues to increase his risk of  falling PT Plan: Address: functional hip strength (standing hip extension and abduction) Berg balance activities,  turning activities.     Goals PT Short Term Goals Time to Complete Short Term Goals: 2 weeks PT Short Term Goal 1: Pt will be independent with HEP in order to maximize therapeutic effect PT Short Term Goal 1 - Progress: Met PT Short Term Goal 2: Pt will perform TUG test and Berg Test PT Short Term Goal 2 - Progress: Met PT Short Term Goal 3: Pt will tandem stance x10 sec on static surface with stand by assitance PT Short Term Goal 3 - Progress: Met PT Short Term Goal 4: Pt will improve BLE strength by 1 muscle grade. PT Short Term Goal 4 - Progress: Partly met PT Long Term Goals PT Long Term Goal 1: Pt will ambulate with supervision with SPC x 5 minutes for safe household ambulation. PT Long Term Goal 1 - Progress: Met PT Long Term Goal 2: Pt will improve BLE strength in order to ascend and descend 15 stairs with 1 handrail with lateral approach in [place].  Long Term Goal 3: Pt will improve LE power and demonstrate sit to stand independently with UE support.  Long Term Goal 3 Progress: Not met  Problem List Patient Active Problem List  Diagnoses  . Muscle weakness (generalized)  . Difficulty in walking    PT - End of Session Activity Tolerance: Patient limited by pain   [place] Mall 07/04/2011, 4:37 PM  Physician Documentation Your signature is required to indicate approval of the treatment plan as stated above.  Please sign and either send electronically or make a copy of this report for your files and return this physician signed original.   Please mark one 1.__approve of plan  2. ___approve of plan with the following conditions.   ______________________________                                                          _____________________ Physician Signature                                                                                                             Date

## 2011-07-09 ENCOUNTER — Ambulatory Visit (HOSPITAL_COMMUNITY)
Admission: RE | Admit: 2011-07-09 | Discharge: 2011-07-09 | Disposition: A | Discharge status: Home / Self Care | Payer: Medicare Other | Source: Ambulatory Visit | Attending: Pathology | Admitting: Pathology

## 2011-07-09 NOTE — Progress Notes (Signed)
Physical Therapy Treatment Patient Details  Name: [name] MRN: [id number] Date of Birth: [birth year]  Today's Date: 07/09/2011 Time: 7829-5621 Time Calculation (min): 49 min Visit#: 9  of 16   Re-eval: 08/02/11 Charges:  Neuro re-ed 15', therex 16', gait 12'    Subjective: Symptoms/Limitations Symptoms: Pt. states he was able to go to church on Sunday without getting anyone's help to get in/out of car or into church.  States his knees are sore today. Pain Assessment Currently in Pain?: Yes Pain Score:   8 Pain Location: Knee Pain Orientation: Right;Left  Mobility (including Balance) Transfers Sit to Stand: 6: Modified independent (Device/Increase time) Ambulation/Gait Ambulation/Gait Assistance: 6: Modified independent (Device/Increase time) Ambulation/Gait Assistance Details (indicate cue type and reason): SPC without VC's for sequencing/stride; min VC's for posture. Ambulation Distance (Feet): 250 Feet Assistive device: Straight cane Gait Pattern: Trunk flexed Gait velocity: normal     Exercise/Treatments Ambulation Distance (Feet): 250 Feet with SPC  Heel Raises 20  Toe raises 20  Tandem gait 2RT with SPC  Retro gait 1 RT with no AD  Toe Tapping with 6" step 10X Hip Abduction 10X each Hip Extension  10X each Stand cone rotation activities (add next visit) Seated:  Sit-stands 4X using UE's, correct posture.  Supine  (not done today due to time) BLE SLR 2x10 Pt. requires AA with R LE.  Bridging x15  Physical Therapy Assessment and Plan PT Assessment and Plan Clinical Impression Statement: Pt. with overall improved gait quality and strength.  Added standing hip abd/ext and toe taps with 1 UE assist.  PT Plan: Add standing cone rotation/turning activities next visit.    Problem List Patient Active Problem List  Diagnoses  . Muscle weakness (generalized)  . Difficulty in walking    PT - End of Session Equipment Utilized During Treatment: Gait belt (B  knee braces) Activity Tolerance: Patient tolerated treatment well General Behavior During Session: [facility] for tasks performed Cognition: [facility] for tasks performed  Emeline Gins B 07/09/2011, 3:57 PM

## 2011-07-11 ENCOUNTER — Ambulatory Visit (HOSPITAL_COMMUNITY)
Admission: RE | Admit: 2011-07-11 | Discharge: 2011-07-11 | Disposition: A | Discharge status: Home / Self Care | Payer: Medicare Other | Source: Ambulatory Visit | Attending: Internal Medicine | Admitting: Internal Medicine

## 2011-07-11 NOTE — Progress Notes (Addendum)
Physical Therapy Treatment Patient Details  Name: [name] MRN: [id number] Date of Birth: 1932-12-16  Today's Date: 07/11/2011 Time: 1440-1520 Time Calculation (min): 40 min Visit#: 10  of 16   Re-eval: 08/02/11  Charge: neuro reed 23 min therex 15 min  Subjective: Symptoms/Limitations Symptoms: Pt stated extreme difficulty getting up out of chair at a cafe for breakfast this morning, the chair had no arms.  Knees are bothering me today, Pain Assessment Pain Score:   8 Pain Location: Knee  Objective:   Exercise/Treatments Ambulation Distance (Feet): 250 Feet with SPC  Heel Raises 20  Toe raises 20  Functional squats 20 reps Stand cone rotation activities 1RT with 10 cones both sides Seated:  Sit-stands 4X using UE's, correct posture.  Supine (not done today due to time)  BLE SLR 2x10 Pt. requires AA with R LE.  Bridging x15 Physical Therapy Assessment and Plan PT Assessment and Plan Clinical Impression Statement: Added cone rotation for increased trunk rotation with min assistance for balance.  Pt B LE fatigued following activity. PT Plan: Progress strength and balance.    Goals    Problem List Patient Active Problem List  Diagnoses  . Muscle weakness (generalized)  . Difficulty in walking    PT - End of Session Activity Tolerance: Patient tolerated treatment well;Patient limited by fatigue General Behavior During Session: [facility] for tasks performed Cognition: [facility] for tasks performed  Juel Burrow 07/11/2011, 3:40 PM

## 2011-07-16 ENCOUNTER — Ambulatory Visit (HOSPITAL_COMMUNITY)
Admission: RE | Admit: 2011-07-16 | Discharge: 2011-07-16 | Disposition: A | Discharge status: Home / Self Care | Payer: Medicare Other | Source: Ambulatory Visit | Attending: Pathology | Admitting: Pathology

## 2011-07-16 DIAGNOSIS — R262 Difficulty in walking, not elsewhere classified: Secondary | ICD-10-CM | POA: Insufficient documentation

## 2011-07-16 DIAGNOSIS — IMO0001 Reserved for inherently not codable concepts without codable children: Secondary | ICD-10-CM | POA: Insufficient documentation

## 2011-07-16 DIAGNOSIS — M6281 Muscle weakness (generalized): Secondary | ICD-10-CM | POA: Insufficient documentation

## 2011-07-16 DIAGNOSIS — E119 Type 2 diabetes mellitus without complications: Secondary | ICD-10-CM | POA: Insufficient documentation

## 2011-07-16 NOTE — Progress Notes (Signed)
Physical Therapy Treatment Patient Details  Name: [name] MRN: [id number] Date of Birth: 23-Jul-1933  Today's Date: 07/16/2011 Time: 7829-5621 Time Calculation (min): 48 min Visit#: 11  of 16   Re-eval: 08/02/11  Charge: gait 20 min therex 15 min Neuro-red 10 min  Subjective: Symptoms/Limitations Symptoms: Pt stated L knee feeling good, R knee and hip bothering this afternoon, 7-8/10 Pain Assessment Currently in Pain?: Yes Pain Score:   8 Pain Location: Knee Pain Orientation: Right  Objective: amb with walker, B knee braces, brought SPC from home for tx.  Exercise/Treatments Gait outdoors x 20' with SPC  Sit to stands 6x with B UE A  Heel raises 20x Toe Raises 20 x Functional squats 20x Scapular retraction 10x Tband row 10x 5" Tandem stance 2x 30"   Physical Therapy Assessment and Plan PT Assessment and Plan Clinical Impression Statement: Pt endurance improving, began gait outdoors with uneven surfaces, incline/decline slopes with standby/min A, vc to increase stride length and posture, one rest break.  Added scapular retraction for postural strengthening.   PT Plan: Progress strength and balance.    Goals    Problem List Patient Active Problem List  Diagnoses  . Muscle weakness (generalized)  . Difficulty in walking    PT - End of Session Equipment Utilized During Treatment: Gait belt;Other (comment) (B knee brace amb with SPC) Activity Tolerance: Patient tolerated treatment well General Behavior During Session: [facility] for tasks performed Cognition: [facility] for tasks performed  Juel Burrow 07/16/2011, 3:47 PM

## 2011-07-17 ENCOUNTER — Ambulatory Visit (HOSPITAL_COMMUNITY)
Admission: RE | Admit: 2011-07-17 | Discharge: 2011-07-17 | Disposition: A | Discharge status: Home / Self Care | Payer: Medicare Other | Source: Ambulatory Visit

## 2011-07-17 NOTE — Progress Notes (Signed)
Physical Therapy Treatment Patient Details  Name: [name] MRN: [id number] Date of Birth: 1933-04-19  Today's Date: 07/17/2011 Time: 4098-1191 Time Calculation (min): 43 min Visit#: 12  of 16   Re-eval: 08/02/11  Charge: neuro re-ed 23 min therex 18 min  Subjective: Symptoms/Limitations Symptoms: R hip and knee bothering me today, not as bad as it was last night, I was wore out following last session, slept good last night.  Rate pain 7-8/10 R LE Pain Assessment Currently in Pain?: Yes Pain Score:   8 Pain Location: Knee Pain Orientation: Right Pain Type: Chronic pain Multiple Pain Sites: Yes  Objective:  Decreased arm swing with SPC gait.   Exercise/Treatments Balance Exercises Tandem Walking: 1 round trip Retro Gait: 1 round trip Rotation with Cones: 1 RT Single Limb Stance: Limitations Single Limb Stance Limitations: 3 attempts each LE 15" holds with R 8" SLS, L with UE AA Tandem Stance: Right;Left Tandem Stance Limitations: 2x 30" Heel Raises: 20 reps Toe Raise: 20 reps  Physical Therapy Assessment and Plan PT Assessment and Plan Clinical Impression Statement: Pt balance and endurance improving.  Able to increased reps with tandem and retro gait with no rest breajks and 1 LOB episodes, able to regain independently with no A.  Pt with increased cadence with SPC but presented wtih decreased arm swing.   PT Plan: Continue with current POC, with gait next session emphasize UE swing with opposite LE.    Goals    Problem List Patient Active Problem List  Diagnoses  . Muscle weakness (generalized)  . Difficulty in walking    PT - End of Session Equipment Utilized During Treatment: Gait belt;Other (comment) (B knee brace, amb with SPC) Activity Tolerance: Patient tolerated treatment well General Behavior During Session: [facility] for tasks performed Cognition: [facility] for tasks performed  Juel Burrow 07/17/2011, 3:33 PM

## 2011-07-24 ENCOUNTER — Ambulatory Visit (HOSPITAL_COMMUNITY)
Admission: RE | Admit: 2011-07-24 | Discharge: 2011-07-24 | Disposition: A | Discharge status: Home / Self Care | Payer: Medicare Other | Source: Ambulatory Visit | Attending: Physical Therapy | Admitting: Physical Therapy

## 2011-07-24 NOTE — Progress Notes (Signed)
Physical Therapy Treatment Patient Details  Name: [name] MRN: [id number] Date of Birth: 1933/05/23  Today's Date: 07/24/2011 Time: 2440-1027 Time Calculation (min): 51 min Visit#: 13  of 16   Re-eval: 08/02/11 Charges:  Gait 12', NMR 15', therex 14'    Subjective: Symptoms/Limitations Symptoms: Pt. reports his knees and back killed him over the weekend, especially on the rainy days.  Reports his pain 9/10 today. Pain Assessment Currently in Pain?: Yes Pain Score:   9 Pain Location: Knee Pain Orientation: Right;Left   Mobility (including Balance) Ambulation/Gait Ambulation/Gait Assistance: 4: Min assist Ambulation/Gait Assistance Details (indicate cue type and reason): SPC ; VC's needed for posture (pt. tends to forward flex at trunk with ambulation) Ambulation Distance (Feet): 100 Feet Assistive device: Straight cane Gait velocity: Slow; Pt. unable to ambulate much with SPC due to increased pain today; Pt came to therapy using rolling walker.     Exercise/Treatments Balance Exercises Tandem Walking: 2 round trips Retro Gait: 2 round trips Single Limb Stance: Limitations Single Limb Stance Limitations: 3 trials.  Max L: 6 sec, R: 3 sec Tandem Stance: Right;Left Tandem Stance Limitations: 2x 30" Heel Raises: 20 reps Toe Raise: 20 reps  Physical Therapy Assessment and Plan PT Assessment and Plan Clinical Impression Statement: Limited ambulation today due to increased pain, however able to complete program.   1 LOB with retro ambulation requiring min assist to correct. PT Treatment/Interventions: Therapeutic exercise;Balance training PT Plan: Continue POC; emphasize swinging UE with opposite LE with gait.      Problem List Patient Active Problem List  Diagnoses  . Muscle weakness (generalized)  . Difficulty in walking    PT - End of Session Equipment Utilized During Treatment: Gait belt Activity Tolerance: Patient tolerated treatment well;Patient limited  by pain General Behavior During Session: [facility] for tasks performed Cognition: [facility] for tasks performed  Emeline Gins B 07/24/2011, 3:50 PM

## 2011-07-26 ENCOUNTER — Ambulatory Visit (HOSPITAL_COMMUNITY)
Admission: RE | Admit: 2011-07-26 | Discharge: 2011-07-26 | Disposition: A | Discharge status: Home / Self Care | Payer: Medicare Other | Source: Ambulatory Visit | Attending: Physical Therapy | Admitting: Physical Therapy

## 2011-07-26 NOTE — Progress Notes (Signed)
Physical Therapy Treatment Patient Details  Name: [name] MRN: [id number] Date of Birth: 1932/10/21  Today's Date: 07/26/2011 Time: 3244-0102 Time Calculation (min): 23 min Charges: 23' TE Visit#: 14  of 16   Re-eval: 08/02/11    Subjective: Symptoms/Limitations Symptoms: Pt comes in today 22 minutes late.  He reports he has been feeling ill for the past few days.  Pain Assessment Currently in Pain?: Yes Pain Score:   8 Pain Location: Knee Pain Orientation: Right;Left   Exercise/Treatments    07/26/11 0700  Knee Exercises: Standing  Hip ADduction AROM;2 sets;10 reps;Both  Stairs Lateral approach with R handrail x10 stairs  Rocker Board 1 minute;Limitations  Rocker Board Limitations R<>L  Other Standing Knee Exercises Hip Extension 2x10 BLE  Other Standing Knee Exercises Toe Raises 2x10      Physical Therapy Assessment and Plan PT Assessment and Plan Clinical Impression Statement: Pt able to complete low level exercises well today with continued pain to his BLE.  Declined gait training with SPC secondary to illness and increased pain.   PT Plan: Cont to progress for 2 more visits.     Goals    Problem List Patient Active Problem List  Diagnoses  . Muscle weakness (generalized)  . Difficulty in walking    PT - End of Session Activity Tolerance: Patient limited by pain;Patient limited by fatigue  Lourie Retz 07/26/2011, 3:24 PM

## 2011-07-29 LAB — URINALYSIS, ROUTINE W REFLEX MICROSCOPIC
Glucose, UA: NEGATIVE
Hgb urine dipstick: NEGATIVE
Specific Gravity, Urine: 1.015
Urobilinogen, UA: >8 — ABNORMAL HIGH
pH: 7.5

## 2011-07-29 LAB — COMPREHENSIVE METABOLIC PANEL
ALT: 65 — ABNORMAL HIGH
AST: 107 — ABNORMAL HIGH
CO2: 24
Chloride: 104
Creatinine, Ser: 0.96
GFR calc Af Amer: >60
GFR calc non Af Amer: >60
Total Bilirubin: 1.3 — ABNORMAL HIGH

## 2011-07-29 LAB — CBC
Hemoglobin: 15.9
MCV: 93.9
RBC: 4.85
WBC: 8.1

## 2011-07-29 LAB — DIFFERENTIAL
Basophils Absolute: 0
Eosinophils Absolute: 0
Eosinophils Relative: 0
Lymphocytes Relative: 30

## 2011-07-30 ENCOUNTER — Ambulatory Visit (HOSPITAL_COMMUNITY)
Admission: RE | Admit: 2011-07-30 | Discharge: 2011-07-30 | Disposition: A | Discharge status: Home / Self Care | Payer: Medicare Other | Source: Ambulatory Visit | Attending: Physical Therapy | Admitting: Physical Therapy

## 2011-07-30 NOTE — Progress Notes (Signed)
Physical Therapy Treatment Patient Details  Name: COURTENAY CREGER MRN: [id number] Date of Birth: 1932-12-27  Today's Date: 07/30/2011 Time: 4098-1191 Time Calculation (min): 58 min Visit#: 15  of 16   Re-eval: 08/02/11 Charges:  Gait X 30', MMT X 1 unit    Subjective: Symptoms/Limitations Symptoms: 7/10 pain today in B knees/back.  Overall feeling better today. Pain Assessment Currently in Pain?: Yes Pain Score:   7 Pain Location: Knee Pain Orientation: Right;Left  Objective LE muscle test as compared to 07/05/11 in ( ) Muscle  Right  Left Hip flexion  5 (3+/5) 5 (5/5) Hip extension  2+(2+/5) 3(2+/5) Hip ABductors  4+(4/5) 4(4/5) Hip Adductors  4(4/5)  4+(4/5) Knee extension 3 (3/5)  3+(3/5) Knee Flexion  3+(3-/5) 5(5/5)  Mobility (including Balance) Pt. Ambulated in department using [facility] with supervision X 150 feet.    Exercise/Treatments Balance Exercises Tandem Walking: 2 round trips Retro Gait: 2 round trips  Physical Therapy Assessment and Plan PT Assessment and Plan Clinical Impression Statement: Completed MMT with overall improvement in LE strength.  Pt. progressing toward goals; to complete balance assessment next visit for full report.  Pt. Has not had any falls since beginning therapy, however feels his balance is still not safe enough to walk without his RW on regular basis. PT Treatment/Interventions: Balance training;Gait training (MMT) PT Plan: Complete TUG and BERG next visit.  Pt. returns to [facility] hospital this week.    Goals PT Short Term Goals 1-3 MET PT Short Term Goal 4  partly met  PT Long Term Goal 1 - MET PT Long Term Goal 2-3  Not Met  Problem List Patient Active Problem List  Diagnoses  . Muscle weakness (generalized)  . Difficulty in walking    PT - End of Session Equipment Utilized During Treatment: Gait belt Activity Tolerance: Patient tolerated treatment well General Behavior During Session: [facility] for tasks performed Cognition: [facility]  for tasks performed  Emeline Gins B 07/30/2011, 3:40 PM

## 2011-08-02 ENCOUNTER — Ambulatory Visit (HOSPITAL_COMMUNITY)
Admission: RE | Admit: 2011-08-02 | Discharge: 2011-08-02 | Disposition: A | Discharge status: Home / Self Care | Payer: Medicare Other | Source: Ambulatory Visit | Attending: Internal Medicine | Admitting: Internal Medicine

## 2011-08-02 NOTE — Progress Notes (Signed)
Physical Therapy Treatment Patient Details  Name: [name] MRN: [id number] Date of Birth: [birth year]  Today's Date: 08/02/2011 Time: 7829-5621 Time Calculation (min): 44 min Visit#: 16  of 16   Re-eval:    Charge: Physical performance testing with separate report: (BERG and TUG below) 40 min  Subjective: Symptoms/Limitations Symptoms: 6/10 pain today in B knees, back don't hurt today.  Pt went to [facility]  hospital yesterday and had an occupational therapy evaluation and physical therapy reeval to see any improvements.  OT gave him some putty to exercise R hand as well as pieces of round foam to his toothbrush and silverware to make functional activities with R hand easier. Pain Assessment Currently in Pain?: Yes Pain Score:   6 Pain Location: Knee Pain Orientation: Right;Left  Objective:  Pt entered session amb with RW, TUG completed with RW with 27" best of 4, and again with SPC with 23" best of 3.    Mobility (including Balance)    Balance Balance Assessed: Yes Dynamic Standing Balance Standardized Balance Assessment: Berg Balance Test;Timed Up and Go Test Berg Balance Test Sit to Stand: Able to stand  independently using hands Standing Unsupported: Able to stand safely 2 minutes Sitting with Back Unsupported but Feet Supported on Floor or Stool: Able to sit safely and securely 2 minutes Stand to Sit: Controls descent by using hands Transfers: Able to transfer safely, definite need of hands Standing Unsupported with Eyes Closed: Able to stand 10 seconds safely Standing Ubsupported with Feet Together: Able to place feet together independently and stand 1 minute safely From Standing, Reach Forward with Outstretched Arm: Can reach confidently >25 cm (10") From Standing Position, Pick up Object from Floor: Able to pick up shoe safely and easily From Standing Position, Turn to Look Behind Over each Shoulder: Looks behind one side only/other side shows less weight shift Turn 360  Degrees: Able to turn 360 degrees safely but slowly Standing Unsupported, Alternately Place Feet on Step/Stool: Able to stand independently and complete 8 steps >20 seconds Standing Unsupported, One Foot in Front: Needs help to step but can hold 15 seconds Standing on One Leg: Tries to lift leg/unable to hold 3 seconds but remains standing independently Total Score: 43  Timed Up and Go Test TUG: Normal TUG Normal TUG (seconds): 23  (23" with SPC, 27" with RW max of 3 attempts)  Exercise/Treatments  TUG and BERG  Physical Therapy Assessment and Plan PT Assessment and Plan Clinical Impression Statement: Completed TUG and BERG balance assessment.  Pt with overall improved balance, strength and functional abilities though still scored at a significant risk for falls. PT Plan: D/C to HEP per 16th sessions met per [facility] approval.  Pt to continue performing HEP at home, ambulating with SPC at home and RW in community.    Goals    Problem List Patient Active Problem List  Diagnoses  . Muscle weakness (generalized)  . Difficulty in walking    PT - End of Session Equipment Utilized During Treatment: Gait belt Activity Tolerance: Patient tolerated treatment well General Behavior During Session: [facility] for tasks performed Cognition: [facility] for tasks performed  Juel Burrow 08/02/2011, 3:25 PM

## 2011-08-06 ENCOUNTER — Inpatient Hospital Stay (HOSPITAL_COMMUNITY): Admission: RE | Admit: 2011-08-06 | Payer: Non-veteran care | Source: Ambulatory Visit

## 2011-08-08 ENCOUNTER — Ambulatory Visit (HOSPITAL_COMMUNITY): Payer: Non-veteran care | Admitting: Physical Therapy

## 2011-08-13 ENCOUNTER — Ambulatory Visit (HOSPITAL_COMMUNITY): Payer: Non-veteran care

## 2011-08-15 ENCOUNTER — Ambulatory Visit (HOSPITAL_COMMUNITY): Payer: Non-veteran care | Admitting: Physical Therapy

## 2011-11-29 ENCOUNTER — Emergency Department (HOSPITAL_COMMUNITY)
Admission: EM | Admit: 2011-11-29 | Discharge: 2011-11-30 | Disposition: A | Discharge status: Other Acute Inpatient Hospital | Payer: Medicare Other | Attending: Emergency Medicine | Admitting: Emergency Medicine

## 2011-11-29 ENCOUNTER — Emergency Department (HOSPITAL_COMMUNITY): Payer: Medicare Other

## 2011-11-29 ENCOUNTER — Encounter (HOSPITAL_COMMUNITY): Payer: Self-pay

## 2011-11-29 DIAGNOSIS — Z7982 Long term (current) use of aspirin: Secondary | ICD-10-CM | POA: Insufficient documentation

## 2011-11-29 DIAGNOSIS — I10 Essential (primary) hypertension: Secondary | ICD-10-CM | POA: Insufficient documentation

## 2011-11-29 DIAGNOSIS — F29 Unspecified psychosis not due to a substance or known physiological condition: Secondary | ICD-10-CM | POA: Insufficient documentation

## 2011-11-29 DIAGNOSIS — R451 Restlessness and agitation: Secondary | ICD-10-CM

## 2011-11-29 DIAGNOSIS — R4182 Altered mental status, unspecified: Secondary | ICD-10-CM | POA: Insufficient documentation

## 2011-11-29 DIAGNOSIS — E119 Type 2 diabetes mellitus without complications: Secondary | ICD-10-CM | POA: Insufficient documentation

## 2011-11-29 DIAGNOSIS — Z8673 Personal history of transient ischemic attack (TIA), and cerebral infarction without residual deficits: Secondary | ICD-10-CM | POA: Insufficient documentation

## 2011-11-29 DIAGNOSIS — F4310 Post-traumatic stress disorder, unspecified: Secondary | ICD-10-CM | POA: Insufficient documentation

## 2011-11-29 DIAGNOSIS — IMO0002 Reserved for concepts with insufficient information to code with codable children: Secondary | ICD-10-CM | POA: Insufficient documentation

## 2011-11-29 DIAGNOSIS — F329 Major depressive disorder, single episode, unspecified: Secondary | ICD-10-CM | POA: Insufficient documentation

## 2011-11-29 DIAGNOSIS — F22 Delusional disorders: Secondary | ICD-10-CM

## 2011-11-29 LAB — CBC
Hemoglobin: 13.5 g/dL (ref 13.0–17.0)
MCH: 33.6 pg (ref 26.0–34.0)
MCV: 97 fL (ref 78.0–100.0)
RBC: 4.02 MIL/uL — ABNORMAL LOW (ref 4.22–5.81)

## 2011-11-29 LAB — COMPREHENSIVE METABOLIC PANEL
ALT: 12 U/L (ref 0–53)
AST: 13 U/L (ref 0–37)
Albumin: 3.9 g/dL (ref 3.5–5.2)
Calcium: 9.8 mg/dL (ref 8.4–10.5)
Sodium: 139 mEq/L (ref 135–145)
Total Protein: 6.9 g/dL (ref 6.0–8.3)

## 2011-11-29 LAB — GLUCOSE, CAPILLARY: Glucose-Capillary: 150 mg/dL — ABNORMAL HIGH (ref 70–99)

## 2011-11-29 MED ORDER — ZIPRASIDONE MESYLATE 20 MG IM SOLR
INTRAMUSCULAR | Status: AC
  Administered 2011-11-29: 20 mg via INTRAMUSCULAR
  Filled 2011-11-29: qty 20
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
## 2011-11-29 NOTE — ED Notes (Signed)
Per family pt was recently started on Venlasaxine and Oxybutnin. Per family pt was slightly agitated prior to the new medication but since the new medication pt has become increasingly angry.
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
## 2011-11-29 NOTE — ED Notes (Signed)
Patient calmly talking with sheriff at this time.

## 2011-11-30 ENCOUNTER — Other Ambulatory Visit: Payer: Self-pay

## 2011-11-30 LAB — URINALYSIS, ROUTINE W REFLEX MICROSCOPIC
Bilirubin Urine: NEGATIVE
Glucose, UA: NEGATIVE mg/dL
Ketones, ur: NEGATIVE mg/dL
Leukocytes, UA: NEGATIVE
pH: 5.5 (ref 5.0–8.0)

## 2011-11-30 LAB — RAPID URINE DRUG SCREEN, HOSP PERFORMED
Amphetamines: NOT DETECTED
Benzodiazepines: NOT DETECTED
Cocaine: NOT DETECTED

## 2011-11-30 LAB — GLUCOSE, CAPILLARY: Glucose-Capillary: 138 mg/dL — ABNORMAL HIGH (ref 70–99)

## 2011-11-30 MED ORDER — ROSUVASTATIN CALCIUM 20 MG PO TABS
20.0000 mg | ORAL_TABLET | Freq: Every day | ORAL | Status: DC
  Administered 2011-11-30: 20 mg via ORAL
  Filled 2011-11-30 (×2): qty 1

## 2011-11-30 MED ORDER — IBUPROFEN 400 MG PO TABS: 600.0000 mg | ORAL_TABLET | Freq: Three times a day (TID) | ORAL | Status: DC | PRN

## 2011-11-30 MED ORDER — ONDANSETRON HCL 4 MG PO TABS: 4.0000 mg | ORAL_TABLET | Freq: Three times a day (TID) | ORAL | Status: DC | PRN

## 2011-11-30 MED ORDER — SERTRALINE HCL 50 MG PO TABS
100.0000 mg | ORAL_TABLET | Freq: Every day | ORAL | Status: DC
  Administered 2011-11-30: 100 mg via ORAL
  Filled 2011-11-30 (×2): qty 1

## 2011-11-30 MED ORDER — ACETAMINOPHEN 325 MG PO TABS: 650.0000 mg | ORAL_TABLET | ORAL | Status: DC | PRN

## 2011-11-30 MED ORDER — LORAZEPAM 1 MG PO TABS: 1.0000 mg | ORAL_TABLET | ORAL | Status: DC | PRN

## 2011-11-30 MED ORDER — BUPROPION HCL ER (SR) 100 MG PO TB12
200.0000 mg | ORAL_TABLET | Freq: Two times a day (BID) | ORAL | Status: DC
  Administered 2011-11-30: 200 mg via ORAL
  Filled 2011-11-30 (×4): qty 2

## 2011-11-30 MED ORDER — METFORMIN HCL 500 MG PO TABS
1000.0000 mg | ORAL_TABLET | Freq: Two times a day (BID) | ORAL | Status: DC
  Administered 2011-11-30 (×2): 1000 mg via ORAL
  Filled 2011-11-30 (×2): qty 2

## 2011-11-30 MED ORDER — ASPIRIN 325 MG PO TABS
325.0000 mg | ORAL_TABLET | Freq: Every day | ORAL | Status: DC
  Administered 2011-11-30: 325 mg via ORAL
  Filled 2011-11-30: qty 1

## 2011-11-30 MED ORDER — PANTOPRAZOLE SODIUM 40 MG PO TBEC
40.0000 mg | DELAYED_RELEASE_TABLET | Freq: Every day | ORAL | Status: DC
  Filled 2011-11-30: qty 1

## 2011-11-30 NOTE — ED Provider Notes (Signed)
BP 134/82  Pulse 98  Temp(Src) 98 F (36.7 C) (Oral)  Resp 18  Ht 6\' 3"  (1.905 m)  Wt 180 lb (81.647 kg)  BMI 22.50 kg/m2  SpO2 96% Pt here for altered mental status/aggressive behavior, and he has long psych history He required antipsychotics for his behavior, and he was aggressive while in the ED He is now calm at this time CT head pending, drug screen pending Possible placement at [facility]   Date: 11/30/2011  Rate: 87  Rhythm: normal sinus rhythm  QRS Axis: normal  Intervals: normal  ST/T Wave abnormalities: normal  Conduction Disutrbances: none  Narrative Interpretation:   Old EKG Reviewed: No significant changes noted       Joya Gaskins, MD 11/30/11 [phone]

## 2011-11-30 NOTE — ED Notes (Signed)
[name] called requesting affidavit/petition and finding and custody. Magistrates office called and involuntary commitment papers faxed to obtain documents.

## 2011-11-30 NOTE — ED Notes (Signed)
Burman Riis called from [facility]. She states she will discuss pt with md for approval.

## 2011-11-30 NOTE — ED Notes (Signed)
Patient transported to CT scan via stretcher; escorted by [employer], Sheriff and security.

## 2011-11-30 NOTE — ED Notes (Signed)
Pt was given a breakfast tray. Pt states he is not hungry. NAD noted at this time. [employer] at bedside.

## 2011-11-30 NOTE — ED Notes (Signed)
Pt refused protonix. Pt states this is not his regular med and is not comfortable taking it.
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
## 2011-11-30 NOTE — ED Notes (Signed)
Son of pt arrived to collect pts belongings. ID was verified [id number]. Son to take responsibility of property.

## 2011-11-30 NOTE — ED Notes (Signed)
I&O cath completed and urine sent to lab

## 2011-11-30 NOTE — ED Notes (Signed)
Pt assisted to bedside commode. Pt having great difficulty with movement due to knee pain

## 2011-11-30 NOTE — ED Notes (Signed)
Paperwork refaxed to [employer].

## 2011-11-30 NOTE — ED Notes (Signed)
Patient returned from CT scan via stretcher.

## 2011-11-30 NOTE — ED Notes (Signed)
Phone call from [place] at [facility] -  Dr. Les Pou has denied admission for this patient.

## 2011-11-30 NOTE — ED Notes (Signed)
Pt was covered in urine. Pt stated that he thought staff had put a catheter in. Pt was assisted to bedside commode. Pt was left there per his request for 10 minutes. Pt was then undressed and cleaned from head to toes with warm soap water. Pt's hair was combed and deodorant was put on underarms. Clean clothes and socks were put on pt. Linens were changed. Pt now back in bed with fresh warm blankets. Pt was also given a cup of ice water.NAD noted at this time.

## 2013-03-09 ENCOUNTER — Encounter: Payer: Self-pay | Admitting: Internal Medicine

## 2013-11-05 ENCOUNTER — Encounter (HOSPITAL_COMMUNITY): Payer: Self-pay | Admitting: Emergency Medicine

## 2013-11-05 ENCOUNTER — Emergency Department (HOSPITAL_COMMUNITY): Payer: Medicare Other

## 2013-11-05 ENCOUNTER — Inpatient Hospital Stay (HOSPITAL_COMMUNITY)
Admission: EM | Admit: 2013-11-05 | Discharge: 2013-11-08 | DRG: 536 | Disposition: A | Discharge status: Other Acute Inpatient Hospital | Payer: Medicare Other | Attending: Internal Medicine | Admitting: Internal Medicine

## 2013-11-05 DIAGNOSIS — Z8673 Personal history of transient ischemic attack (TIA), and cerebral infarction without residual deficits: Secondary | ICD-10-CM

## 2013-11-05 DIAGNOSIS — I10 Essential (primary) hypertension: Secondary | ICD-10-CM | POA: Diagnosis present

## 2013-11-05 DIAGNOSIS — I672 Cerebral atherosclerosis: Secondary | ICD-10-CM | POA: Diagnosis present

## 2013-11-05 DIAGNOSIS — F329 Major depressive disorder, single episode, unspecified: Secondary | ICD-10-CM | POA: Diagnosis present

## 2013-11-05 DIAGNOSIS — F4310 Post-traumatic stress disorder, unspecified: Secondary | ICD-10-CM | POA: Diagnosis present

## 2013-11-05 DIAGNOSIS — Z79899 Other long term (current) drug therapy: Secondary | ICD-10-CM

## 2013-11-05 DIAGNOSIS — I739 Peripheral vascular disease, unspecified: Secondary | ICD-10-CM | POA: Diagnosis present

## 2013-11-05 DIAGNOSIS — Y9210 Unspecified residential institution as the place of occurrence of the external cause: Secondary | ICD-10-CM | POA: Diagnosis present

## 2013-11-05 DIAGNOSIS — S72001A Fracture of unspecified part of neck of right femur, initial encounter for closed fracture: Secondary | ICD-10-CM | POA: Diagnosis present

## 2013-11-05 DIAGNOSIS — W19XXXA Unspecified fall, initial encounter: Secondary | ICD-10-CM | POA: Diagnosis present

## 2013-11-05 DIAGNOSIS — F0150 Vascular dementia without behavioral disturbance: Secondary | ICD-10-CM | POA: Diagnosis present

## 2013-11-05 DIAGNOSIS — W010XXA Fall on same level from slipping, tripping and stumbling without subsequent striking against object, initial encounter: Secondary | ICD-10-CM | POA: Diagnosis present

## 2013-11-05 DIAGNOSIS — Z7982 Long term (current) use of aspirin: Secondary | ICD-10-CM

## 2013-11-05 DIAGNOSIS — Y92129 Unspecified place in nursing home as the place of occurrence of the external cause: Secondary | ICD-10-CM

## 2013-11-05 DIAGNOSIS — S72143A Displaced intertrochanteric fracture of unspecified femur, initial encounter for closed fracture: Principal | ICD-10-CM | POA: Diagnosis present

## 2013-11-05 DIAGNOSIS — E119 Type 2 diabetes mellitus without complications: Secondary | ICD-10-CM | POA: Diagnosis present

## 2013-11-05 DIAGNOSIS — M25551 Pain in right hip: Secondary | ICD-10-CM

## 2013-11-05 HISTORY — DX: Vascular dementia, unspecified severity, without behavioral disturbance, psychotic disturbance, mood disturbance, and anxiety: F01.50

## 2013-11-05 HISTORY — DX: Major depressive disorder, single episode, unspecified: F32.9

## 2013-11-05 HISTORY — DX: Post-traumatic stress disorder, unspecified: F43.10

## 2013-11-05 HISTORY — DX: Gastrointestinal hemorrhage, unspecified: K92.2

## 2013-11-05 LAB — COMPREHENSIVE METABOLIC PANEL
ALT: 19 U/L (ref 0–53)
AST: 19 U/L (ref 0–37)
Albumin: 3.7 g/dL (ref 3.5–5.2)
Alkaline Phosphatase: 80 U/L (ref 39–117)
BUN: 19 mg/dL (ref 6–23)
CALCIUM: 9.3 mg/dL (ref 8.4–10.5)
CO2: 25 mEq/L (ref 19–32)
CREATININE: 0.8 mg/dL (ref 0.50–1.35)
Chloride: 100 mEq/L (ref 96–112)
GFR calc non Af Amer: 83 mL/min — ABNORMAL LOW (ref >90)
GLUCOSE: 167 mg/dL — AB (ref 70–99)
Potassium: 4.5 mEq/L (ref 3.7–5.3)
SODIUM: 138 meq/L (ref 137–147)
TOTAL PROTEIN: 6.9 g/dL (ref 6.0–8.3)
Total Bilirubin: 0.3 mg/dL (ref 0.3–1.2)

## 2013-11-05 LAB — CBC WITH DIFFERENTIAL/PLATELET
BASOS PCT: 0 % (ref 0–1)
Basophils Absolute: 0 10*3/uL (ref 0.0–0.1)
EOS ABS: 0 10*3/uL (ref 0.0–0.7)
EOS PCT: 1 % (ref 0–5)
HCT: 32.6 % — ABNORMAL LOW (ref 39.0–52.0)
Hemoglobin: 10.1 g/dL — ABNORMAL LOW (ref 13.0–17.0)
Lymphocytes Relative: 13 % (ref 12–46)
Lymphs Abs: 0.9 10*3/uL (ref 0.7–4.0)
MCH: 24.4 pg — AB (ref 26.0–34.0)
MCHC: 31 g/dL (ref 30.0–36.0)
MCV: 78.7 fL (ref 78.0–100.0)
MONOS PCT: 8 % (ref 3–12)
Monocytes Absolute: 0.5 10*3/uL (ref 0.1–1.0)
NEUTROS PCT: 79 % — AB (ref 43–77)
Neutro Abs: 5.2 10*3/uL (ref 1.7–7.7)
PLATELETS: 95 10*3/uL — AB (ref 150–400)
RBC: 4.14 MIL/uL — ABNORMAL LOW (ref 4.22–5.81)
RDW: 18.2 % — ABNORMAL HIGH (ref 11.5–15.5)
WBC: 6.6 10*3/uL (ref 4.0–10.5)

## 2013-11-05 LAB — TROPONIN I

## 2013-11-05 LAB — PRO B NATRIURETIC PEPTIDE: PRO B NATRI PEPTIDE: 52.6 pg/mL (ref 0–450)

## 2013-11-05 MED ORDER — HYDROMORPHONE HCL PF 1 MG/ML IJ SOLN
0.5000 mg | Freq: Once | INTRAMUSCULAR | Status: AC
  Administered 2013-11-05: 0.5 mg via INTRAVENOUS
  Filled 2013-11-05: qty 1

## 2013-11-05 MED ORDER — SODIUM CHLORIDE 0.9 % IV SOLN: INTRAVENOUS | Status: DC

## 2013-11-05 MED ORDER — ONDANSETRON HCL 4 MG/2ML IJ SOLN
4.0000 mg | Freq: Once | INTRAMUSCULAR | Status: AC
  Administered 2013-11-05: 4 mg via INTRAVENOUS
  Filled 2013-11-05: qty 2

## 2013-11-05 MED ORDER — SODIUM CHLORIDE 0.9 % IV BOLUS (SEPSIS)
250.0000 mL | Freq: Once | INTRAVENOUS | Status: AC
  Administered 2013-11-05: 250 mL via INTRAVENOUS

## 2013-11-05 NOTE — ED Notes (Signed)
Pt vomited at arrival, was turned x2 staff to prevent aspiration.  Pt alert at present. Long standing h/o dementia

## 2013-11-05 NOTE — ED Notes (Signed)
Pt wanting off the lsb. Family at bedside assured them we would get him removed as soon as possible.

## 2013-11-05 NOTE — ED Notes (Signed)
Pt arrived from [place], had fallen  In the bathroom, c/o right hip pain. Arrived on lsb, no c-collar in place.

## 2013-11-05 NOTE — ED Provider Notes (Signed)
CSN: [id number]     Arrival date & time 11/05/13  1855 History   First MD Initiated Contact with Patient 11/05/13 1857     Chief Complaint  Patient presents with  . Fall   (Consider location/radiation/quality/duration/timing/severity/associated sxs/prior Treatment) Patient is a 80 y.o. male presenting with fall. The history is provided by the patient, the EMS personnel, a relative and the spouse. The history is limited by the condition of the patient.  Fall   patient from [facility] for nursing facility. Patient had a fall in the bathroom around 6 in the evening. Patient complaining of right hip pain. Still he reported that he also did strike his head. Brought in by EMS long spine board and c-collar. Patient not complaining of anything other than the right hip pain. No loss of consciousness. Patient does have a history of dementia. Patient is alert when you talk to him there appears to be some confusion. Level V caveat applies due to his confusion.   Past Medical History  Diagnosis Date  . Diabetes mellitus   . CVA (cerebral infarction)   . Hypertension   . GI bleed   . PTSD (post-traumatic stress disorder)   . Vascular dementia   . MDD (major depressive disorder)     w/ paranoia   History reviewed. No pertinent past surgical history. No family history on file. History  Substance Use Topics  . Smoking status: Never Smoker   . Smokeless tobacco: Not on file  . Alcohol Use: No    Review of Systems  Unable to perform ROS  level V caveat applies due to his dementia. However patient is able to talk but is not clear whether the information is back full because is been some confusion on his part.  Allergies  Yellow jacket venom; Betadine prepstick plus; Cortisone; Penicillins; and Sulfa antibiotics  Home Medications   Current Outpatient Rx  Name  Route  Sig  Dispense  Refill  . ARIPiprazole (ABILIFY) 15 MG tablet   Oral   Take 7.5 mg by mouth daily.         Marland Kitchen aspirin EC 81  MG tablet   Oral   Take 81 mg by mouth daily.         Marland Kitchen atorvastatin (LIPITOR) 80 MG tablet   Oral   Take 80 mg by mouth daily.         Marland Kitchen docusate sodium (COLACE) 100 MG capsule   Oral   Take 100 mg by mouth at bedtime.         . gabapentin (NEURONTIN) 100 MG capsule   Oral   Take 100 mg by mouth 2 (two) times daily.         . magnesium oxide (MAG-OX) 400 MG tablet   Oral   Take 800 mg by mouth daily.         . metFORMIN (GLUCOPHAGE) 850 MG tablet   Oral   Take 850 mg by mouth 2 (two) times daily with a meal.         . pantoprazole (PROTONIX) 40 MG tablet   Oral   Take 40 mg by mouth daily.         Marland Kitchen venlafaxine XR (EFFEXOR-XR) 150 MG 24 hr capsule   Oral   Take 150 mg by mouth daily with breakfast.         . vitamin B-12 (CYANOCOBALAMIN) 1000 MCG tablet   Oral   Take 1,000 mcg by mouth daily.         Marland Kitchen  Vitamin D, Ergocalciferol, (DRISDOL) 50000 UNITS CAPS capsule   Oral   Take 50,000 Units by mouth every 7 (seven) days. Tuesday         . nitroGLYCERIN (NITROSTAT) 0.4 MG SL tablet   Sublingual   Place 0.4 mg under the tongue every 5 (five) minutes as needed.            BP 105/71  Pulse 69  Temp(Src) 98.4 F (36.9 C) (Oral)  Resp 18  SpO2 93% Physical Exam  Nursing note and vitals reviewed. Constitutional: He appears well-developed and well-nourished. No distress.  HENT:  Head: Normocephalic and atraumatic.  Mouth/Throat: Oropharynx is clear and moist.  Eyes: Conjunctivae and EOM are normal. Pupils are equal, round, and reactive to light.  Cardiovascular: Normal rate and normal heart sounds.   No murmur heard. Pulmonary/Chest: Effort normal. No respiratory distress.  Abdominal: Soft. Bowel sounds are normal. There is no tenderness.  Musculoskeletal: He exhibits tenderness. He exhibits no edema.  Patient with complaint to his right hip leg is bent at 90 at the knee. Dorsalis pedis pulses 1+ in the right foot. There is tenderness to  palpation to the right hip. No other extremity complaints.  Neurological: He is alert. No cranial nerve deficit. He exhibits normal muscle tone. Coordination normal.  Skin: Skin is warm. No rash noted.    ED Course  Procedures (including critical care time) Labs Review Labs Reviewed  CBC WITH DIFFERENTIAL - Abnormal; Notable for the following:    RBC 4.14 (*)    Hemoglobin 10.1 (*)    HCT 32.6 (*)    MCH 24.4 (*)    RDW 18.2 (*)    Platelets 95 (*)    Neutrophils Relative % 79 (*)    All other components within normal limits  COMPREHENSIVE METABOLIC PANEL - Abnormal; Notable for the following:    Glucose, Bld 167 (*)    GFR calc non Af Amer 83 (*)    All other components within normal limits  PRO B NATRIURETIC PEPTIDE  URINALYSIS, ROUTINE W REFLEX MICROSCOPIC  TROPONIN I   Results for orders placed during the hospital encounter of 11/05/13  CBC WITH DIFFERENTIAL      Result Value Range   WBC 6.6  4.0 - 10.5 K/uL   RBC 4.14 (*) 4.22 - 5.81 MIL/uL   Hemoglobin 10.1 (*) 13.0 - 17.0 g/dL   HCT 16.1 (*) 09.6 - 04.5 %   MCV 78.7  78.0 - 100.0 fL   MCH 24.4 (*) 26.0 - 34.0 pg   MCHC 31.0  30.0 - 36.0 g/dL   RDW 40.9 (*) 81.1 - 91.4 %   Platelets 95 (*) 150 - 400 K/uL   Neutrophils Relative % 79 (*) 43 - 77 %   Neutro Abs 5.2  1.7 - 7.7 K/uL   Lymphocytes Relative 13  12 - 46 %   Lymphs Abs 0.9  0.7 - 4.0 K/uL   Monocytes Relative 8  3 - 12 %   Monocytes Absolute 0.5  0.1 - 1.0 K/uL   Eosinophils Relative 1  0 - 5 %   Eosinophils Absolute 0.0  0.0 - 0.7 K/uL   Basophils Relative 0  0 - 1 %   Basophils Absolute 0.0  0.0 - 0.1 K/uL   Smear Review SPECIMEN CHECKED FOR CLOTS    COMPREHENSIVE METABOLIC PANEL      Result Value Range   Sodium 138  137 - 147 mEq/L   Potassium 4.5  3.7 - 5.3 mEq/L   Chloride 100  96 - 112 mEq/L   CO2 25  19 - 32 mEq/L   Glucose, Bld 167 (*) 70 - 99 mg/dL   BUN 19  6 - 23 mg/dL   Creatinine, Ser 1.610.80  0.50 - 1.35 mg/dL   Calcium 9.3  8.4 -  09.610.5 mg/dL   Total Protein 6.9  6.0 - 8.3 g/dL   Albumin 3.7  3.5 - 5.2 g/dL   AST 19  0 - 37 U/L   ALT 19  0 - 53 U/L   Alkaline Phosphatase 80  39 - 117 U/L   Total Bilirubin 0.3  0.3 - 1.2 mg/dL   GFR calc non Af Amer 83 (*) >90 mL/min   GFR calc Af Amer >90  >90 mL/min  PRO B NATRIURETIC PEPTIDE      Result Value Range   Pro B Natriuretic peptide (BNP) 52.6  0 - 450 pg/mL    Imaging Review Dg Hip Bilateral W/pelvis  11/05/2013   CLINICAL DATA:  Fall.  Right hip pain  EXAM: BILATERAL HIP WITH PELVIS - 4+ VIEW  COMPARISON:  None.  FINDINGS: There is an intertrochanteric fracture involving the proximal right femur. Varus angulation of the distal fracture fragments noted. The left hip appears located.  IMPRESSION: 1. Acute, intertrochanteric fracture of the proximal right femur.   Electronically Signed   By: Signa Kellaylor  Stroud M.D.   On: 11/05/2013 22:40    EKG Interpretation    Date/Time:  Friday November 05 2013 20:50:20 EST Ventricular Rate:  84 PR Interval:  194 QRS Duration: 90 QT Interval:  376 QTC Calculation: 444 R Axis:   76 Text Interpretation:  Sinus rhythm with Premature atrial complexes T wave abnormality, consider inferior ischemia Abnormal ECG When compared with ECG of 30-Nov-2011 01:00, No significant change was found Confirmed by Andrick Rust  MD, Samay Delcarlo ([id number]) on 11/05/2013 10:27:11 PM            MDM   1. Fall   2. Hip pain, right     Patient status post fall from a nursing facility [facility]. Patient's physician at the nursing facility is Dr. Felecia ShellingFanta. The concern is for a left hip fracture. Patient also hit his head. Patient fell in the bathroom so he fell from a sitting or standing position. Patient does have some dementia. Patient arrived a long spine board with a c-collar in place however neck is nontender it can be clinically cleared. Head also shows no evidence of any significant injuries. Main concern is for the left hip. Preoperative labs have been ordered in  case the x-rays are positive.  X-rays are still pending. CT scan of head is still pending. Preoperative labs were ordered.  Shelda JakesScott W. Margan Elias, MD 11/05/13 [phone]

## 2013-11-06 ENCOUNTER — Encounter (HOSPITAL_COMMUNITY): Payer: Self-pay | Admitting: *Deleted

## 2013-11-06 ENCOUNTER — Encounter (HOSPITAL_COMMUNITY): Payer: Self-pay | Admitting: Anesthesiology

## 2013-11-06 DIAGNOSIS — F0150 Vascular dementia without behavioral disturbance: Secondary | ICD-10-CM

## 2013-11-06 DIAGNOSIS — Y92129 Unspecified place in nursing home as the place of occurrence of the external cause: Secondary | ICD-10-CM

## 2013-11-06 DIAGNOSIS — S72009A Fracture of unspecified part of neck of unspecified femur, initial encounter for closed fracture: Secondary | ICD-10-CM

## 2013-11-06 DIAGNOSIS — W19XXXA Unspecified fall, initial encounter: Secondary | ICD-10-CM

## 2013-11-06 DIAGNOSIS — S72001A Fracture of unspecified part of neck of right femur, initial encounter for closed fracture: Secondary | ICD-10-CM | POA: Diagnosis present

## 2013-11-06 DIAGNOSIS — I10 Essential (primary) hypertension: Secondary | ICD-10-CM

## 2013-11-06 LAB — CBC
HCT: 31.6 % — ABNORMAL LOW (ref 39.0–52.0)
Hemoglobin: 9.9 g/dL — ABNORMAL LOW (ref 13.0–17.0)
MCH: 24.5 pg — ABNORMAL LOW (ref 26.0–34.0)
MCHC: 31.3 g/dL (ref 30.0–36.0)
MCV: 78.2 fL (ref 78.0–100.0)
PLATELETS: 88 10*3/uL — AB (ref 150–400)
RBC: 4.04 MIL/uL — AB (ref 4.22–5.81)
RDW: 18.3 % — AB (ref 11.5–15.5)
WBC: 6.7 10*3/uL (ref 4.0–10.5)

## 2013-11-06 LAB — BASIC METABOLIC PANEL
BUN: 18 mg/dL (ref 6–23)
CHLORIDE: 99 meq/L (ref 96–112)
CO2: 25 meq/L (ref 19–32)
Calcium: 8.9 mg/dL (ref 8.4–10.5)
Creatinine, Ser: 0.86 mg/dL (ref 0.50–1.35)
GFR calc non Af Amer: 81 mL/min — ABNORMAL LOW (ref >90)
Glucose, Bld: 230 mg/dL — ABNORMAL HIGH (ref 70–99)
POTASSIUM: 4.8 meq/L (ref 3.7–5.3)
SODIUM: 136 meq/L — AB (ref 137–147)

## 2013-11-06 LAB — GLUCOSE, CAPILLARY
GLUCOSE-CAPILLARY: 183 mg/dL — AB (ref 70–99)
Glucose-Capillary: 161 mg/dL — ABNORMAL HIGH (ref 70–99)
Glucose-Capillary: 165 mg/dL — ABNORMAL HIGH (ref 70–99)

## 2013-11-06 LAB — PROTIME-INR
INR: 1.2 (ref 0.00–1.49)
Prothrombin Time: 14.9 seconds (ref 11.6–15.2)

## 2013-11-06 LAB — URINALYSIS, ROUTINE W REFLEX MICROSCOPIC
BILIRUBIN URINE: NEGATIVE
Glucose, UA: NEGATIVE mg/dL
Hgb urine dipstick: NEGATIVE
Ketones, ur: NEGATIVE mg/dL
LEUKOCYTES UA: NEGATIVE
NITRITE: NEGATIVE
PH: 7.5 (ref 5.0–8.0)
Protein, ur: NEGATIVE mg/dL
SPECIFIC GRAVITY, URINE: 1.015 (ref 1.005–1.030)
UROBILINOGEN UA: 0.2 mg/dL (ref 0.0–1.0)

## 2013-11-06 LAB — PREPARE RBC (CROSSMATCH)

## 2013-11-06 MED ORDER — PANTOPRAZOLE SODIUM 40 MG PO TBEC
40.0000 mg | DELAYED_RELEASE_TABLET | Freq: Every day | ORAL | Status: DC
  Administered 2013-11-07: 40 mg via ORAL
  Filled 2013-11-06: qty 1

## 2013-11-06 MED ORDER — DOCUSATE SODIUM 100 MG PO CAPS
100.0000 mg | ORAL_CAPSULE | Freq: Every day | ORAL | Status: DC
  Administered 2013-11-06 – 2013-11-07 (×2): 100 mg via ORAL
  Filled 2013-11-06 (×2): qty 1

## 2013-11-06 MED ORDER — ASPIRIN 81 MG PO CHEW
81.0000 mg | CHEWABLE_TABLET | Freq: Every day | ORAL | Status: DC
  Administered 2013-11-06 – 2013-11-07 (×2): 81 mg via ORAL
  Filled 2013-11-06 (×2): qty 1

## 2013-11-06 MED ORDER — VENLAFAXINE HCL ER 75 MG PO CP24
150.0000 mg | ORAL_CAPSULE | Freq: Every day | ORAL | Status: DC
  Administered 2013-11-06: 150 mg via ORAL

## 2013-11-06 MED ORDER — ATORVASTATIN CALCIUM 40 MG PO TABS
80.0000 mg | ORAL_TABLET | Freq: Every day | ORAL | Status: DC
  Administered 2013-11-07: 80 mg via ORAL
  Filled 2013-11-06: qty 2

## 2013-11-06 MED ORDER — HYDROCODONE-ACETAMINOPHEN 5-325 MG PO TABS: 1.0000 | ORAL_TABLET | Freq: Four times a day (QID) | ORAL | Status: DC | PRN

## 2013-11-06 MED ORDER — ARIPIPRAZOLE 2 MG PO TABS
ORAL_TABLET | ORAL | Status: AC
  Filled 2013-11-06: qty 1

## 2013-11-06 MED ORDER — ARIPIPRAZOLE 10 MG PO TABS
ORAL_TABLET | ORAL | Status: AC
  Filled 2013-11-06: qty 1

## 2013-11-06 MED ORDER — METHOCARBAMOL 500 MG PO TABS: 500.0000 mg | ORAL_TABLET | Freq: Four times a day (QID) | ORAL | Status: DC | PRN

## 2013-11-06 MED ORDER — VENLAFAXINE HCL ER 75 MG PO CP24
ORAL_CAPSULE | ORAL | Status: AC
  Filled 2013-11-06: qty 2

## 2013-11-06 MED ORDER — MORPHINE SULFATE 2 MG/ML IJ SOLN
0.5000 mg | INTRAMUSCULAR | Status: DC | PRN
  Administered 2013-11-06 – 2013-11-08 (×11): 0.5 mg via INTRAVENOUS
  Filled 2013-11-06 (×12): qty 1

## 2013-11-06 MED ORDER — GABAPENTIN 100 MG PO CAPS
100.0000 mg | ORAL_CAPSULE | Freq: Every day | ORAL | Status: DC
  Administered 2013-11-06 – 2013-11-07 (×2): 100 mg via ORAL
  Filled 2013-11-06 (×2): qty 1

## 2013-11-06 MED ORDER — METFORMIN HCL 850 MG PO TABS
850.0000 mg | ORAL_TABLET | Freq: Two times a day (BID) | ORAL | Status: DC
  Filled 2013-11-06 (×3): qty 1

## 2013-11-06 MED ORDER — INSULIN ASPART 100 UNIT/ML ~~LOC~~ SOLN: 0.0000 [IU] | Freq: Every day | SUBCUTANEOUS | Status: DC

## 2013-11-06 MED ORDER — ARIPIPRAZOLE 5 MG PO TABS
7.5000 mg | ORAL_TABLET | Freq: Every day | ORAL | Status: DC
  Administered 2013-11-06 – 2013-11-07 (×2): 7 mg via ORAL
  Filled 2013-11-06 (×3): qty 1

## 2013-11-06 MED ORDER — MAGNESIUM OXIDE 400 (241.3 MG) MG PO TABS
800.0000 mg | ORAL_TABLET | Freq: Every day | ORAL | Status: DC
  Administered 2013-11-06 – 2013-11-07 (×2): 800 mg via ORAL
  Filled 2013-11-06 (×3): qty 2

## 2013-11-06 MED ORDER — INSULIN ASPART 100 UNIT/ML ~~LOC~~ SOLN
0.0000 [IU] | Freq: Three times a day (TID) | SUBCUTANEOUS | Status: DC
  Administered 2013-11-07 (×2): 2 [IU] via SUBCUTANEOUS
  Administered 2013-11-07: 1 [IU] via SUBCUTANEOUS

## 2013-11-06 MED ORDER — METHOCARBAMOL 100 MG/ML IJ SOLN
500.0000 mg | Freq: Four times a day (QID) | INTRAVENOUS | Status: DC | PRN
  Filled 2013-11-06: qty 5

## 2013-11-06 MED ORDER — NITROGLYCERIN 0.4 MG SL SUBL: 0.4000 mg | SUBLINGUAL_TABLET | SUBLINGUAL | Status: DC | PRN

## 2013-11-06 NOTE — H&P (Signed)
PCP:   Gertha Calkin   Chief Complaint:  Larey Seat, now with rt hip pain  HPI: 80 yo male with dementia, dm, htn lives in assisted living for past 2 years was getting up to go to bathroom when he tripped and fell hurting his right hip.  No recent illnesses.  His wife and daughter are present with him now.  Pain well controlled right now.  Has rt hip fracture.  He gets most of his care at the [facility] in [place] and with dr Felecia Shelling here locally.    Review of Systems:  Positive and negative as per HPI otherwise all other systems are negative  Past Medical History: Past Medical History  Diagnosis Date  . Diabetes mellitus   . CVA (cerebral infarction)   . Hypertension   . GI bleed   . PTSD (post-traumatic stress disorder)   . Vascular dementia   . MDD (major depressive disorder)     w/ paranoia   History reviewed. No pertinent past surgical history.  Medications: Prior to Admission medications   Medication Sig Start Date End Date Taking? Authorizing Provider  ARIPiprazole (ABILIFY) 15 MG tablet Take 7.5 mg by mouth daily.   Yes Historical Provider, MD  aspirin EC 81 MG tablet Take 81 mg by mouth daily.   Yes Historical Provider, MD  atorvastatin (LIPITOR) 80 MG tablet Take 80 mg by mouth daily.   Yes Historical Provider, MD  docusate sodium (COLACE) 100 MG capsule Take 100 mg by mouth at bedtime.   Yes Historical Provider, MD  gabapentin (NEURONTIN) 100 MG capsule Take 100 mg by mouth 2 (two) times daily.   Yes Historical Provider, MD  magnesium oxide (MAG-OX) 400 MG tablet Take 800 mg by mouth daily.   Yes Historical Provider, MD  metFORMIN (GLUCOPHAGE) 850 MG tablet Take 850 mg by mouth 2 (two) times daily with a meal.   Yes Historical Provider, MD  pantoprazole (PROTONIX) 40 MG tablet Take 40 mg by mouth daily.   Yes Historical Provider, MD  venlafaxine XR (EFFEXOR-XR) 150 MG 24 hr capsule Take 150 mg by mouth daily with breakfast.   Yes Historical Provider, MD  vitamin B-12 (CYANOCOBALAMIN)  1000 MCG tablet Take 1,000 mcg by mouth daily.   Yes Historical Provider, MD  Vitamin D, Ergocalciferol, (DRISDOL) 50000 UNITS CAPS capsule Take 50,000 Units by mouth every 7 (seven) days. Tuesday   Yes Historical Provider, MD  nitroGLYCERIN (NITROSTAT) 0.4 MG SL tablet Place 0.4 mg under the tongue every 5 (five) minutes as needed.      Historical Provider, MD    Allergies:   Allergies  Allergen Reactions  . Yellow Jacket Venom Anaphylaxis  . Betadine Prepstick Plus   . Cortisone   . Penicillins   . Sulfa Antibiotics     Social History:  reports that he has never smoked. He does not have any smokeless tobacco history on file. He reports that he does not drink alcohol or use illicit drugs.  Family History: none  Physical Exam: Filed Vitals:   11/05/13 1903 11/05/13 2012  BP: 142/70 105/71  Pulse: 84 69  Temp: 98.4 F (36.9 C)   TempSrc: Oral   Resp: 18 18  SpO2: 99% 93%   General appearance: alert, cooperative and no distress Head: Normocephalic, without obvious abnormality, atraumatic Eyes: negative Nose: Nares normal. Septum midline. Mucosa normal. No drainage or sinus tenderness. Neck: no JVD and supple, symmetrical, trachea midline Lungs: clear to auscultation bilaterally Heart: regular rate and rhythm, S1,  S2 normal, no murmur, click, rub or gallop Abdomen: soft, non-tender; bowel sounds normal; no masses,  no organomegaly Extremities: extremities normal, atraumatic, no cyanosis or edema Pulses: 2+ and symmetric Skin: Skin color, texture, turgor normal. No rashes or lesions Neurologic: Grossly normal    Labs on Admission:   Recent Labs  11/05/13 2108  NA 138  K 4.5  CL 100  CO2 25  GLUCOSE 167*  BUN 19  CREATININE 0.80  CALCIUM 9.3    Recent Labs  11/05/13 2108  AST 19  ALT 19  ALKPHOS 80  BILITOT 0.3  PROT 6.9  ALBUMIN 3.7    Recent Labs  11/05/13 2108  WBC 6.6  NEUTROABS 5.2  HGB 10.1*  HCT 32.6*  MCV 78.7  PLT 95*    Recent  Labs  11/05/13 2108  TROPONINI <0.30   Radiological Exams on Admission: Dg Chest 1 View  11/05/2013   CLINICAL DATA:  Fall  EXAM: CHEST - 1 VIEW  COMPARISON:  11/29/2011  FINDINGS: The heart size and mediastinal contours are within normal limits. Calcified atherosclerotic disease involves the aortic arch. Both lungs are clear. The visualized skeletal structures are unremarkable.  IMPRESSION: No active disease.   Electronically Signed   By: Signa Kellaylor  Stroud M.D.   On: 11/05/2013 22:42   Dg Hip Bilateral W/pelvis  11/05/2013   CLINICAL DATA:  Fall.  Right hip pain  EXAM: BILATERAL HIP WITH PELVIS - 4+ VIEW  COMPARISON:  None.  FINDINGS: There is an intertrochanteric fracture involving the proximal right femur. Varus angulation of the distal fracture fragments noted. The left hip appears located.  IMPRESSION: 1. Acute, intertrochanteric fracture of the proximal right femur.   Electronically Signed   By: Signa Kellaylor  Stroud M.D.   On: 11/05/2013 22:40   Ct Head Wo Contrast  11/05/2013   CLINICAL DATA:  Fall  EXAM: CT HEAD WITHOUT CONTRAST  TECHNIQUE: Contiguous axial images were obtained from the base of the skull through the vertex without intravenous contrast.  COMPARISON:  Prior CT from 11/30/2011  FINDINGS: Diffuse cerebral atrophy, most prevalent involving the left cerebral hemisphere is unchanged. 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 Remote lacunar infarcts involving the bilateral basal ganglia and white matter of the left corona radiata are stable as well. Chronic microvascular ischemic changes are similar.  No acute intracranial hemorrhage or large vessel territory infarct. No mass or midline shift. No hydrocephalus. No acute extra-axial fluid collection.  The calvarium is intact. Orbits are within normal limits. Paranasal sinuses and mastoid air cells are clear.  IMPRESSION: 1. No acute intracranial abnormality. 2. Stable atrophy with chronic microvascular ischemic changes and remote  lacunar infarcts.   Electronically Signed   By: Rise MuBenjamin  McClintock M.D.   On: 11/05/2013 23:04    Assessment/Plan  [age] yo male with mechanical fall and right intertrochanteric fracture of proximal femur  Principal Problem:   Hip fracture, right-  Keep npo for OR tomorrow.  Ortho has been called and to see in am.  Place on hip fracture pathway.  Hold on lovenox for now for possible surgery.  Scds.  Active Problems:   Fall at nursing home   Vascular dementia   Hypertension    Crissa Sowder A 11/06/2013, 1:19 AM

## 2013-11-06 NOTE — ED Provider Notes (Signed)
Dr. Romeo AppleHarrison has been paged for consult.  Awaiting ortho consult  Benny LennertJoseph L Arian Mcquitty, MD 11/06/13 [phone]

## 2013-11-06 NOTE — Consult Note (Signed)
Reason for Consult: fracture right hip  Referring Physician:  department and Dr. Burton Apley is an 80 y.o. male.  HPI:  this is a 80 year old male with multiple medical problems recently evaluated at the [facility] for peripheral arterial disease, at that time he was also scheduled for a cardiology consult. He resides at a local assisted-living facility and was in his usual state of health when he fell yesterday and fractured his right hip. In August of last year he had a significant ulcer requiring 6 units of blood secondary to blood thinner which was prescribed for his CVA back in 2012. He would like to be transferred to the [place] because of his medical records and care being done at that facility.  He complains of mild discomfort at this point but he cannot ambulate secondary to the right hip fracture which is an intertrochanteric fracture.  The history obtained from medical records:  Patient is a 80 y.o. male presenting with fall. The history is provided by the patient, the EMS personnel, a relative and the spouse. The history is limited by the condition of the patient.  [facility]  [facility]. Patient had a fall in the bathroom around 6 in the evening. Patient complaining of right hip pain. Still he reported that he also did strike his head. Brought in by EMS long spine board and c-collar. Patient not complaining of anything other than the right hip pain. No loss of consciousness. Patient does have a history of dementia. Patient is alert when you talk to him there appears to be some confusion.  Level V caveat applies due to his confusion.   Past Medical History  Diagnosis Date  . Diabetes mellitus   . CVA (cerebral infarction)   . Hypertension   . GI bleed   . PTSD (post-traumatic stress disorder)   . Vascular dementia   . MDD (major depressive disorder)     w/ paranoia    History reviewed. No pertinent past surgical history.  No family history on  file.  Social History:  reports that he has never smoked. He does not have any smokeless tobacco history on file. He reports that he does not drink alcohol or use illicit drugs.  Allergies:  Allergies  Allergen Reactions  . Yellow [place] Venom Anaphylaxis  . Betadine Prepstick Plus   . Cortisone   . Penicillins   . Sulfa Antibiotics     Medications:  Prior to Admission:  Prescriptions prior to admission  Medication Sig Dispense Refill  . ARIPiprazole (ABILIFY) 15 MG tablet Take 7.5 mg by mouth daily.      Marland Kitchen aspirin EC 81 MG tablet Take 81 mg by mouth daily.      Marland Kitchen atorvastatin (LIPITOR) 80 MG tablet Take 80 mg by mouth daily.      Marland Kitchen docusate sodium (COLACE) 100 MG capsule Take 100 mg by mouth at bedtime.      . gabapentin (NEURONTIN) 100 MG capsule Take 100 mg by mouth 2 (two) times daily.      . magnesium oxide (MAG-OX) 400 MG tablet Take 800 mg by mouth daily.      . metFORMIN (GLUCOPHAGE) 850 MG tablet Take 850 mg by mouth 2 (two) times daily with a meal.      . pantoprazole (PROTONIX) 40 MG tablet Take 40 mg by mouth daily.      Marland Kitchen venlafaxine XR (EFFEXOR-XR) 150 MG 24 hr capsule Take 150 mg by mouth  daily with breakfast.      . vitamin B-12 (CYANOCOBALAMIN) 1000 MCG tablet Take 1,000 mcg by mouth daily.      . Vitamin D, Ergocalciferol, (DRISDOL) 50000 UNITS CAPS capsule Take 50,000 Units by mouth every 7 (seven) days. Tuesday      . nitroGLYCERIN (NITROSTAT) 0.4 MG SL tablet Place 0.4 mg under the tongue every 5 (five) minutes as needed.          Results for orders placed during the hospital encounter of 11/05/13 (from the past 48 hour(s))  CBC WITH DIFFERENTIAL     Status: Abnormal   Collection Time    11/05/13  9:08 PM      Result Value Range   WBC 6.6  4.0 - 10.5 K/uL   RBC 4.14 (*) 4.22 - 5.81 MIL/uL   Hemoglobin 10.1 (*) 13.0 - 17.0 g/dL   HCT 32.6 (*) 39.0 - 52.0 %   MCV 78.7  78.0 - 100.0 fL   MCH 24.4 (*) 26.0 - 34.0 pg   MCHC 31.0  30.0 - 36.0 g/dL   RDW 18.2  (*) 11.5 - 15.5 %   Platelets 95 (*) 150 - 400 K/uL   Neutrophils Relative % 79 (*) 43 - 77 %   Neutro Abs 5.2  1.7 - 7.7 K/uL   Lymphocytes Relative 13  12 - 46 %   Lymphs Abs 0.9  0.7 - 4.0 K/uL   Monocytes Relative 8  3 - 12 %   Monocytes Absolute 0.5  0.1 - 1.0 K/uL   Eosinophils Relative 1  0 - 5 %   Eosinophils Absolute 0.0  0.0 - 0.7 K/uL   Basophils Relative 0  0 - 1 %   Basophils Absolute 0.0  0.0 - 0.1 K/uL   Smear Review SPECIMEN CHECKED FOR CLOTS     Comment: PLATELET COUNT CONFIRMED BY SMEAR  COMPREHENSIVE METABOLIC PANEL     Status: Abnormal   Collection Time    11/05/13  9:08 PM      Result Value Range   Sodium 138  137 - 147 mEq/L   Potassium 4.5  3.7 - 5.3 mEq/L   Chloride 100  96 - 112 mEq/L   CO2 25  19 - 32 mEq/L   Glucose, Bld 167 (*) 70 - 99 mg/dL   BUN 19  6 - 23 mg/dL   Creatinine, Ser 0.80  0.50 - 1.35 mg/dL   Calcium 9.3  8.4 - 10.5 mg/dL   Total Protein 6.9  6.0 - 8.3 g/dL   Albumin 3.7  3.5 - 5.2 g/dL   AST 19  0 - 37 U/L   ALT 19  0 - 53 U/L   Alkaline Phosphatase 80  39 - 117 U/L   Total Bilirubin 0.3  0.3 - 1.2 mg/dL   GFR calc non Af Amer 83 (*) >90 mL/min   GFR calc Af Amer >90  >90 mL/min   Comment: (NOTE)     The eGFR has been calculated using the CKD EPI equation.     This calculation has not been validated in all clinical situations.     eGFR's persistently <90 mL/min signify possible Chronic Kidney     Disease.  PRO B NATRIURETIC PEPTIDE     Status: None   Collection Time    11/05/13  9:08 PM      Result Value Range   Pro B Natriuretic peptide (BNP) 52.6  0 - 450 pg/mL  TROPONIN I  Status: None   Collection Time    11/05/13  9:08 PM      Result Value Range   Troponin I <0.30  <0.30 ng/mL   Comment:            Due to the release kinetics of cTnI,     a negative result within the first hours     of the onset of symptoms does not rule out     myocardial infarction with certainty.     If myocardial infarction is still  suspected,     repeat the test at appropriate intervals.  URINALYSIS, ROUTINE W REFLEX MICROSCOPIC     Status: None   Collection Time    11/06/13  2:18 AM      Result Value Range   Color, Urine YELLOW  YELLOW   APPearance CLEAR  CLEAR   Specific Gravity, Urine 1.015  1.005 - 1.030   pH 7.5  5.0 - 8.0   Glucose, UA NEGATIVE  NEGATIVE mg/dL   Hgb urine dipstick NEGATIVE  NEGATIVE   Bilirubin Urine NEGATIVE  NEGATIVE   Ketones, ur NEGATIVE  NEGATIVE mg/dL   Protein, ur NEGATIVE  NEGATIVE mg/dL   Urobilinogen, UA 0.2  0.0 - 1.0 mg/dL   Nitrite NEGATIVE  NEGATIVE   Leukocytes, UA NEGATIVE  NEGATIVE   Comment: MICROSCOPIC NOT DONE ON URINES WITH NEGATIVE PROTEIN, BLOOD, LEUKOCYTES, NITRITE, OR GLUCOSE <1000 mg/dL.  CBC     Status: Abnormal   Collection Time    11/06/13  4:49 AM      Result Value Range   WBC 6.7  4.0 - 10.5 K/uL   RBC 4.04 (*) 4.22 - 5.81 MIL/uL   Hemoglobin 9.9 (*) 13.0 - 17.0 g/dL   HCT 31.6 (*) 39.0 - 52.0 %   MCV 78.2  78.0 - 100.0 fL   MCH 24.5 (*) 26.0 - 34.0 pg   MCHC 31.3  30.0 - 36.0 g/dL   RDW 18.3 (*) 11.5 - 15.5 %   Platelets 88 (*) 150 - 400 K/uL   Comment: SPECIMEN CHECKED FOR CLOTS     PLATELET COUNT CONFIRMED BY SMEAR  BASIC METABOLIC PANEL     Status: Abnormal   Collection Time    11/06/13  4:49 AM      Result Value Range   Sodium 136 (*) 137 - 147 mEq/L   Potassium 4.8  3.7 - 5.3 mEq/L   Chloride 99  96 - 112 mEq/L   CO2 25  19 - 32 mEq/L   Glucose, Bld 230 (*) 70 - 99 mg/dL   BUN 18  6 - 23 mg/dL   Creatinine, Ser 0.86  0.50 - 1.35 mg/dL   Calcium 8.9  8.4 - 10.5 mg/dL   GFR calc non Af Amer 81 (*) >90 mL/min   GFR calc Af Amer >90  >90 mL/min   Comment: (NOTE)     The eGFR has been calculated using the CKD EPI equation.     This calculation has not been validated in all clinical situations.     eGFR's persistently <90 mL/min signify possible Chronic Kidney     Disease.  TYPE AND SCREEN     Status: None   Collection Time    11/06/13   4:49 AM      Result Value Range   ABO/RH(D) O POS     Antibody Screen POS     Sample Expiration 11/09/2013    PROTIME-INR     Status:  None   Collection Time    11/06/13  4:49 AM      Result Value Range   Prothrombin Time 14.9  11.6 - 15.2 seconds   INR 1.20  0.00 - 1.49  GLUCOSE, CAPILLARY     Status: Abnormal   Collection Time    11/06/13  7:18 AM      Result Value Range   Glucose-Capillary 183 (*) 70 - 99 mg/dL   Comment 1 Notify RN     Comment 2 Documented in Chart      Dg Chest 1 View  11/05/2013   CLINICAL DATA:  Fall  EXAM: CHEST - 1 VIEW  COMPARISON:  11/29/2011  FINDINGS: The heart size and mediastinal contours are within normal limits. Calcified atherosclerotic disease involves the aortic arch. Both lungs are clear. The visualized skeletal structures are unremarkable.  IMPRESSION: No active disease.   Electronically Signed   By: Kerby Moors M.D.   On: 11/05/2013 22:42   Dg Hip Bilateral W/pelvis  11/05/2013   CLINICAL DATA:  Fall.  Right hip pain  EXAM: BILATERAL HIP WITH PELVIS - 4+ VIEW  COMPARISON:  None.  FINDINGS: There is an intertrochanteric fracture involving the proximal right femur. Varus angulation of the distal fracture fragments noted. The left hip appears located.  IMPRESSION: 1. Acute, intertrochanteric fracture of the proximal right femur.   Electronically Signed   By: Kerby Moors M.D.   On: 11/05/2013 22:40   Ct Head Wo Contrast  11/05/2013   CLINICAL DATA:  Fall  EXAM: CT HEAD WITHOUT CONTRAST  TECHNIQUE: Contiguous axial images were obtained from the base of the skull through the vertex without intravenous contrast.  COMPARISON:  Prior CT from 11/30/2011  FINDINGS: Diffuse cerebral atrophy, most prevalent involving the left cerebral hemisphere is unchanged. 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 Remote lacunar infarcts involving the bilateral basal ganglia and white matter of the left corona radiata are stable as well. Chronic microvascular  ischemic changes are similar.  No acute intracranial hemorrhage or large vessel territory infarct. No mass or midline shift. No hydrocephalus. No acute extra-axial fluid collection.  The calvarium is intact. Orbits are within normal limits. Paranasal sinuses and mastoid air cells are clear.  IMPRESSION: 1. No acute intracranial abnormality. 2. Stable atrophy with chronic microvascular ischemic changes and remote lacunar infarcts.   Electronically Signed   By: Jeannine Boga M.D.   On: 11/05/2013 23:04    ROS Blood pressure 107/69, pulse 102, temperature 98.3 F (36.8 C), temperature source Oral, resp. rate 20, height 6' 3"  (1.905 m), weight 89.8 kg (197 lb 15.6 oz), SpO2 92.00%. Physical Exam Current vital signs are stable with a pulse of 102. He has an O2 sat of 92% on oxygen. His general appearance is normal he has a deformity of the right lower extremity with external rotation and shortening the knee and hip are flexed. The peripheral vascular system shows a warm foot no ischemia he has a palpable dorsalis and posterior tibial pulse grade 1. There is no real peripheral edema Skin is warm dry and intact without ulceration there was a blister on the dorsum of a flexed IP joint of the left great toe but it has resolved and there are no signs of infection He is awake and oriented to person and place Overall muscle tone is normal no atrophy or tremor are noted His upper extremities and left lower extremities are grossly normal. He does have 2 scars from previous knee  surgery for which he has disability related to the [employer]. Neurologically he has no focal abnormalities No focal lymphadenopathy  As far as the right leg shows a did not move it I do not see any reason to cause him undue pain. He has a classic position of a fractured hip and it matches his x-ray findings and his complaints of pain over the right hip.    Assessment/Plan: This is a 80 year old male with multiple medical  problems recent evaluation for ischemic peripheral artery disease and suggestion was made for cardiology consult who presents with right hip fracture secondary to a fall has a hemoglobin of 9.9. His family like and taken to the [place] I agree with that decision based on his medical history.  We will not be to get a cardiology consult until Monday. I think transfer and surgery prior to that would be in the patient's best interest  Arther Abbott 11/06/2013, 10:36 AM

## 2013-11-06 NOTE — ED Notes (Signed)
Family very concerned about pt being treated at the [facility]. Dr. Deretha EmoryZackowski and Dr. Estell HarpinZammit both have spoken with the family and told them that he needs the surgery today. [facility] calling [facility] and [facility] for [facility]. [facility] wants the [facility] to [facility] to [facility] here.

## 2013-11-06 NOTE — Progress Notes (Signed)
Utilization review Completed Caralee Morea RN BSN   

## 2013-11-06 NOTE — Progress Notes (Signed)
His family requested he be transferred to the [facility] for treatment if possible. Paperwork has been sent to the [facility] but I don't think we have approval yet. He is okay for surgery from a medical point of view. I have discussed this at length with the patient and with his family including his daughter who has power of attorney and she understands.

## 2013-11-07 LAB — GLUCOSE, CAPILLARY
Glucose-Capillary: 195 mg/dL — ABNORMAL HIGH (ref 70–99)
Glucose-Capillary: 148 mg/dL — ABNORMAL HIGH (ref 70–99)
Glucose-Capillary: 173 mg/dL — ABNORMAL HIGH (ref 70–99)

## 2013-11-07 MED ORDER — DSS 100 MG PO CAPS: 100.0000 mg | ORAL_CAPSULE | Freq: Every day | ORAL | Status: DC

## 2013-11-07 MED ORDER — ARIPIPRAZOLE 15 MG PO TABS: 7.5000 mg | ORAL_TABLET | Freq: Every day | ORAL | Status: DC

## 2013-11-07 MED ORDER — PANTOPRAZOLE SODIUM 40 MG PO TBEC: 40.0000 mg | DELAYED_RELEASE_TABLET | Freq: Every day | ORAL | Status: DC

## 2013-11-07 MED ORDER — ASPIRIN 81 MG PO CHEW: 81.0000 mg | CHEWABLE_TABLET | Freq: Every day | ORAL | Status: DC

## 2013-11-07 MED ORDER — INSULIN ASPART 100 UNIT/ML ~~LOC~~ SOLN: 0.0000 [IU] | Freq: Every day | SUBCUTANEOUS | Status: DC

## 2013-11-07 MED ORDER — MAGNESIUM OXIDE 400 (241.3 MG) MG PO TABS: 800.0000 mg | ORAL_TABLET | Freq: Every day | ORAL | Status: DC

## 2013-11-07 MED ORDER — GABAPENTIN 100 MG PO CAPS
100.0000 mg | ORAL_CAPSULE | Freq: Every day | ORAL | Status: DC
Start: 2013-11-07 — End: 2015-07-12

## 2013-11-07 MED ORDER — ATORVASTATIN CALCIUM 80 MG PO TABS: 80.0000 mg | ORAL_TABLET | Freq: Every day | ORAL | Status: DC

## 2013-11-07 MED ORDER — NITROGLYCERIN 0.4 MG SL SUBL: 0.4000 mg | SUBLINGUAL_TABLET | SUBLINGUAL | Status: DC | PRN

## 2013-11-07 MED ORDER — INSULIN ASPART 100 UNIT/ML ~~LOC~~ SOLN: 0.0000 [IU] | Freq: Three times a day (TID) | SUBCUTANEOUS | Status: DC

## 2013-11-07 MED ORDER — METFORMIN HCL 850 MG PO TABS: 850.0000 mg | ORAL_TABLET | Freq: Two times a day (BID) | ORAL | Status: DC

## 2013-11-07 MED ORDER — VENLAFAXINE HCL ER 150 MG PO CP24: 150.0000 mg | ORAL_CAPSULE | Freq: Every day | ORAL | Status: AC

## 2013-11-07 NOTE — Progress Notes (Signed)
Pt's daughter Gavin Pound(deborah) requests that if pt is transferred in the night that she be contacted. Her home number is [phone] and cell is [phone].

## 2013-11-07 NOTE — Progress Notes (Signed)
He is complaining of some pain in his hip this morning. Thanks to Dr. Romeo AppleHarrison for seeing him. He may be set for transfer to the [facility] today and all the paperwork has been filled out. I discussed this with his family and they understand.

## 2013-11-07 NOTE — Discharge Summary (Signed)
Physician Discharge Summary  Patient ID: William DarlingRalph W Jagielski MRN: [id number] DOB/AGE: October 13, 1933 80 y.o. Primary Care Physician:ISBEY,SUSAN Admit date: 11/05/2013 Discharge date: 11/07/2013    Discharge Diagnoses:   Principal Problem:   Hip fracture, right Active Problems:   Fall at nursing home   Vascular dementia   Hypertension     Medication List    STOP taking these medications       aspirin EC 81 MG tablet  Replaced by:  aspirin 81 MG chewable tablet     magnesium oxide 400 MG tablet  Commonly known as:  MAG-OX     vitamin B-12 1000 MCG tablet  Commonly known as:  CYANOCOBALAMIN     Vitamin D (Ergocalciferol) 50000 UNITS Caps capsule  Commonly known as:  DRISDOL      TAKE these medications       ARIPiprazole 15 MG tablet  Commonly known as:  ABILIFY  Take 0.5 tablets (7.5 mg total) by mouth daily.     aspirin 81 MG chewable tablet  Chew 1 tablet (81 mg total) by mouth daily.     atorvastatin 80 MG tablet  Commonly known as:  LIPITOR  Take 1 tablet (80 mg total) by mouth daily at 6 PM.     DSS 100 MG Caps  Take 100 mg by mouth daily.     gabapentin 100 MG capsule  Commonly known as:  NEURONTIN  Take 1 capsule (100 mg total) by mouth daily.     insulin aspart 100 UNIT/ML injection  Commonly known as:  novoLOG  Inject 0-9 Units into the skin 3 (three) times daily with meals.     insulin aspart 100 UNIT/ML injection  Commonly known as:  novoLOG  Inject 0-5 Units into the skin at bedtime.     magnesium oxide 400 (241.3 MG) MG tablet  Commonly known as:  MAG-OX  Take 2 tablets (800 mg total) by mouth daily.     metFORMIN 850 MG tablet  Commonly known as:  GLUCOPHAGE  Take 1 tablet (850 mg total) by mouth 2 (two) times daily with a meal.     nitroGLYCERIN 0.4 MG SL tablet  Commonly known as:  NITROSTAT  Place 1 tablet (0.4 mg total) under the tongue every 5 (five) minutes as needed for chest pain.     pantoprazole 40 MG tablet  Commonly known as:   PROTONIX  Take 1 tablet (40 mg total) by mouth daily.     venlafaxine XR 150 MG 24 hr capsule  Commonly known as:  EFFEXOR-XR  Take 1 capsule (150 mg total) by mouth daily with breakfast.        Discharged Condition: Unchanged    Consults: Orthopedics  Significant Diagnostic Studies: Dg Chest 1 View  11/05/2013   CLINICAL DATA:  Fall  EXAM: CHEST - 1 VIEW  COMPARISON:  11/29/2011  FINDINGS: The heart size and mediastinal contours are within normal limits. Calcified atherosclerotic disease involves the aortic arch. Both lungs are clear. The visualized skeletal structures are unremarkable.  IMPRESSION: No active disease.   Electronically Signed   By: Signa Kellaylor  Stroud M.D.   On: 11/05/2013 22:42   Dg Hip Bilateral W/pelvis  11/05/2013   CLINICAL DATA:  Fall.  Right hip pain  EXAM: BILATERAL HIP WITH PELVIS - 4+ VIEW  COMPARISON:  None.  FINDINGS: There is an intertrochanteric fracture involving the proximal right femur. Varus angulation of the distal fracture fragments noted. The left hip appears located.  IMPRESSION: 1. Acute,  intertrochanteric fracture of the proximal right femur.   Electronically Signed   By: Signa Kell M.D.   On: 11/05/2013 22:40   Ct Head Wo Contrast  11/05/2013   CLINICAL DATA:  Fall  EXAM: CT HEAD WITHOUT CONTRAST  TECHNIQUE: Contiguous axial images were obtained from the base of the skull through the vertex without intravenous contrast.  COMPARISON:  Prior CT from 11/30/2011  FINDINGS: Diffuse cerebral atrophy, most prevalent involving the left cerebral hemisphere is unchanged. 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 Remote lacunar infarcts involving the bilateral basal ganglia and white matter of the left corona radiata are stable as well. Chronic microvascular ischemic changes are similar.  No acute intracranial hemorrhage or large vessel territory infarct. No mass or midline shift. No hydrocephalus. No acute extra-axial fluid collection.  The calvarium  is intact. Orbits are within normal limits. Paranasal sinuses and mastoid air cells are clear.  IMPRESSION: 1. No acute intracranial abnormality. 2. Stable atrophy with chronic microvascular ischemic changes and remote lacunar infarcts.   Electronically Signed   By: Rise Mu M.D.   On: 11/05/2013 23:04    Lab Results: Basic Metabolic Panel:  Recent Labs  16/10/96 2108 11/06/13 0449  NA 138 136*  K 4.5 4.8  CL 100 99  CO2 25 25  GLUCOSE 167* 230*  BUN 19 18  CREATININE 0.80 0.86  CALCIUM 9.3 8.9   Liver Function Tests:  Recent Labs  11/05/13 2108  AST 19  ALT 19  ALKPHOS 80  BILITOT 0.3  PROT 6.9  ALBUMIN 3.7     CBC:  Recent Labs  11/05/13 2108 11/06/13 0449  WBC 6.6 6.7  NEUTROABS 5.2  --   HGB 10.1* 9.9*  HCT 32.6* 31.6*  MCV 78.7 78.2  PLT 95* 88*    No results found for this or any previous visit (from the past 240 hour(s)).   Hospital Course: This is a 80 year old who has had a previous stroke and who fell at the nursing home. He complained of pain and was found to have a hip fracture. He has [employer] and requests that his care be at the [place]. Consultation was obtained with orthopedics who felt that he needed to have surgery but that that could be accomplished at the [facility] if they have a bed.  Discharge Exam: Blood pressure 95/48, pulse 87, temperature 97.9 F (36.6 C), temperature source Oral, resp. rate 18, height 6\' 3"  (1.905 m), weight 89.8 kg (197 lb 15.6 oz), SpO2 91.00%. He is mildly confused. His chest is clear. His heart is regular.  Disposition: To the [facility] when bed is available      Discharge Orders   Future Orders Complete By Expires   Discharge patient  As directed    Comments:     [facility]        Signed: Giani Betzold Elbert Ewings Pager [phone]  11/07/2013, 10:22 AM

## 2013-11-07 NOTE — Progress Notes (Signed)
Pt is still awaiting bed from [facility] [place]. Pt will be discharged there once bed becomes available. Will continue to monitor.

## 2013-11-08 LAB — GLUCOSE, CAPILLARY
GLUCOSE-CAPILLARY: 187 mg/dL — AB (ref 70–99)
Glucose-Capillary: 189 mg/dL — ABNORMAL HIGH (ref 70–99)

## 2013-11-08 LAB — VITAMIN D 25 HYDROXY (VIT D DEFICIENCY, FRACTURES): VIT D 25 HYDROXY: 50 ng/mL (ref 30–89)

## 2013-11-08 NOTE — Progress Notes (Signed)
Subjective: Patient was admitted over the week end due to rt hip fracture. He is being planned to transferred [facility] according to the family request.   Objective: Vital signs in last 24 hours: Temp:  [98 F (36.7 C)-98.6 F (37 C)] 98 F (36.7 C) (01/26 0601) Pulse Rate:  [74-85] 74 (01/26 0601) Resp:  [15-19] 15 (01/26 0601) BP: (100-114)/(59-70) 100/59 mmHg (01/26 0601) SpO2:  [92 %-97 %] 92 % (01/26 0601) Weight change:  Last BM Date: 11/05/13  Intake/Output from previous day: 01/25 0701 - 01/26 0700 In: 200 [P.O.:200] Out: 850 [Urine:850]  PHYSICAL EXAM General appearance: alert and no distress Resp: clear to auscultation bilaterally Cardio: S1, S2 normal GI: soft, non-tender; bowel sounds normal; no masses,  no organomegaly Extremities: tenderness and swelling of Rt hip area  Lab Results:    @labtest @ [facility] No results found for this basename: PHART, PCO2, PO2ART, TCO2, HCO3,  in the last 72 hours CULTURES No results found for this or any previous visit (from the past 240 hour(s)). Studies/Results: No results found.  Medications: I have reviewed the patient's current medications.  Assesment: Principal Problem:   Hip fracture, right Active Problems:   Fall at nursing home   Vascular dementia   Hypertension    Plan:Medications reviewed [facility].    LOS: 3 days   Alishba Naples 11/08/2013, 8:10 AM

## 2013-11-08 NOTE — Progress Notes (Signed)
Pt to be transported to [facility] later this morning. Report has been called to receiving facility and family has been notified of the transfer. IV is to remain in, pt VS are stable, pt is alert to self/situation/place, pt is in NAD. Will continue to monitor until [facility] arrives to facilitate transportation.

## 2013-11-10 LAB — TYPE AND SCREEN
ABO/RH(D): O POS
ANTIBODY SCREEN: POSITIVE
DAT, IGG: NEGATIVE
Donor AG Type: NEGATIVE
Donor AG Type: NEGATIVE
PT AG Type: NEGATIVE
UNIT DIVISION: 0
Unit division: 0

## 2013-11-22 ENCOUNTER — Emergency Department: Payer: Self-pay | Admitting: Emergency Medicine

## 2013-11-22 LAB — URINALYSIS, COMPLETE
Bilirubin,UR: NEGATIVE
Blood: NEGATIVE
GLUCOSE, UR: NEGATIVE mg/dL (ref 0–75)
Hyaline Cast: 1
Ketone: NEGATIVE
Leukocyte Esterase: NEGATIVE
Nitrite: NEGATIVE
PH: 5 (ref 4.5–8.0)
Protein: NEGATIVE
RBC,UR: <1 /HPF (ref 0–5)
Specific Gravity: 1.019 (ref 1.003–1.030)
Squamous Epithelial: NONE SEEN
WBC UR: 2 /HPF (ref 0–5)

## 2013-11-22 LAB — COMPREHENSIVE METABOLIC PANEL
ALK PHOS: 185 U/L — AB
AST: 26 U/L (ref 15–37)
Albumin: 3 g/dL — ABNORMAL LOW (ref 3.4–5.0)
Anion Gap: 9 (ref 7–16)
BUN: 22 mg/dL — ABNORMAL HIGH (ref 7–18)
Bilirubin,Total: 0.8 mg/dL (ref 0.2–1.0)
CALCIUM: 9.4 mg/dL (ref 8.5–10.1)
CHLORIDE: 101 mmol/L (ref 98–107)
CO2: 26 mmol/L (ref 21–32)
Creatinine: 1.04 mg/dL (ref 0.60–1.30)
EGFR (African American): >60
Glucose: 141 mg/dL — ABNORMAL HIGH (ref 65–99)
OSMOLALITY: 278 (ref 275–301)
POTASSIUM: 4 mmol/L (ref 3.5–5.1)
SGPT (ALT): 18 U/L (ref 12–78)
SODIUM: 136 mmol/L (ref 136–145)
Total Protein: 7.7 g/dL (ref 6.4–8.2)

## 2013-11-22 LAB — CBC WITH DIFFERENTIAL/PLATELET
BASOS ABS: 0.1 10*3/uL (ref 0.0–0.1)
BASOS PCT: 0.6 %
Eosinophil #: 0.2 10*3/uL (ref 0.0–0.7)
Eosinophil %: 1.7 %
HCT: 38.5 % — ABNORMAL LOW (ref 40.0–52.0)
HGB: 12.4 g/dL — AB (ref 13.0–18.0)
LYMPHS PCT: 18.6 %
Lymphocyte #: 2 10*3/uL (ref 1.0–3.6)
MCH: 27.1 pg (ref 26.0–34.0)
MCHC: 32.2 g/dL (ref 32.0–36.0)
MCV: 84 fL (ref 80–100)
MONO ABS: 0.6 x10 3/mm (ref 0.2–1.0)
Monocyte %: 5.9 %
Neutrophil #: 7.7 10*3/uL — ABNORMAL HIGH (ref 1.4–6.5)
Neutrophil %: 73.2 %
PLATELETS: 292 10*3/uL (ref 150–440)
RBC: 4.57 10*6/uL (ref 4.40–5.90)
RDW: 26.3 % — ABNORMAL HIGH (ref 11.5–14.5)
WBC: 10.5 10*3/uL (ref 3.8–10.6)

## 2013-11-22 LAB — PHOSPHORUS: Phosphorus: 4.5 mg/dL (ref 2.5–4.9)

## 2013-11-22 LAB — MAGNESIUM: Magnesium: 2.2 mg/dL

## 2013-11-22 LAB — TROPONIN I: Troponin-I: <0.02 ng/mL

## 2013-11-22 LAB — PROTIME-INR
INR: 1.2
Prothrombin Time: 14.9 secs — ABNORMAL HIGH (ref 11.5–14.7)

## 2013-11-24 LAB — URINE CULTURE

## 2013-11-27 LAB — CULTURE, BLOOD (SINGLE)

## 2014-03-26 ENCOUNTER — Other Ambulatory Visit: Payer: Self-pay

## 2014-03-26 LAB — CK: CK, Total: 16 U/L — ABNORMAL LOW

## 2014-05-27 ENCOUNTER — Emergency Department: Payer: Self-pay | Admitting: Emergency Medicine

## 2014-05-27 LAB — CBC
HCT: 39.6 % — AB (ref 40.0–52.0)
HGB: 12.7 g/dL — ABNORMAL LOW (ref 13.0–18.0)
MCH: 31.1 pg (ref 26.0–34.0)
MCHC: 32.1 g/dL (ref 32.0–36.0)
MCV: 97 fL (ref 80–100)
PLATELETS: 127 10*3/uL — AB (ref 150–440)
RBC: 4.1 10*6/uL — ABNORMAL LOW (ref 4.40–5.90)
RDW: 16.7 % — ABNORMAL HIGH (ref 11.5–14.5)
WBC: 10.4 10*3/uL (ref 3.8–10.6)

## 2014-05-27 LAB — BASIC METABOLIC PANEL
ANION GAP: 9 (ref 7–16)
BUN: 24 mg/dL — AB (ref 7–18)
CALCIUM: 8.9 mg/dL (ref 8.5–10.1)
CHLORIDE: 103 mmol/L (ref 98–107)
Co2: 27 mmol/L (ref 21–32)
Creatinine: 0.83 mg/dL (ref 0.60–1.30)
EGFR (African American): >60
GLUCOSE: 137 mg/dL — AB (ref 65–99)
Osmolality: 284 (ref 275–301)
POTASSIUM: 4.6 mmol/L (ref 3.5–5.1)
Sodium: 139 mmol/L (ref 136–145)

## 2014-05-27 LAB — CK TOTAL AND CKMB (NOT AT ARMC)
CK, TOTAL: 28 U/L — AB
CK-MB: <0.5 ng/mL — ABNORMAL LOW (ref 0.5–3.6)

## 2014-05-27 LAB — TROPONIN I

## 2015-02-06 IMAGING — CR DG CHEST 1V PORT
1 series · 1 of 1 positions shown · non-contrast
Comparison: 11/22/2013

CLINICAL DATA: Chest pain

EXAM:
PORTABLE CHEST - 1 VIEW

[ap]
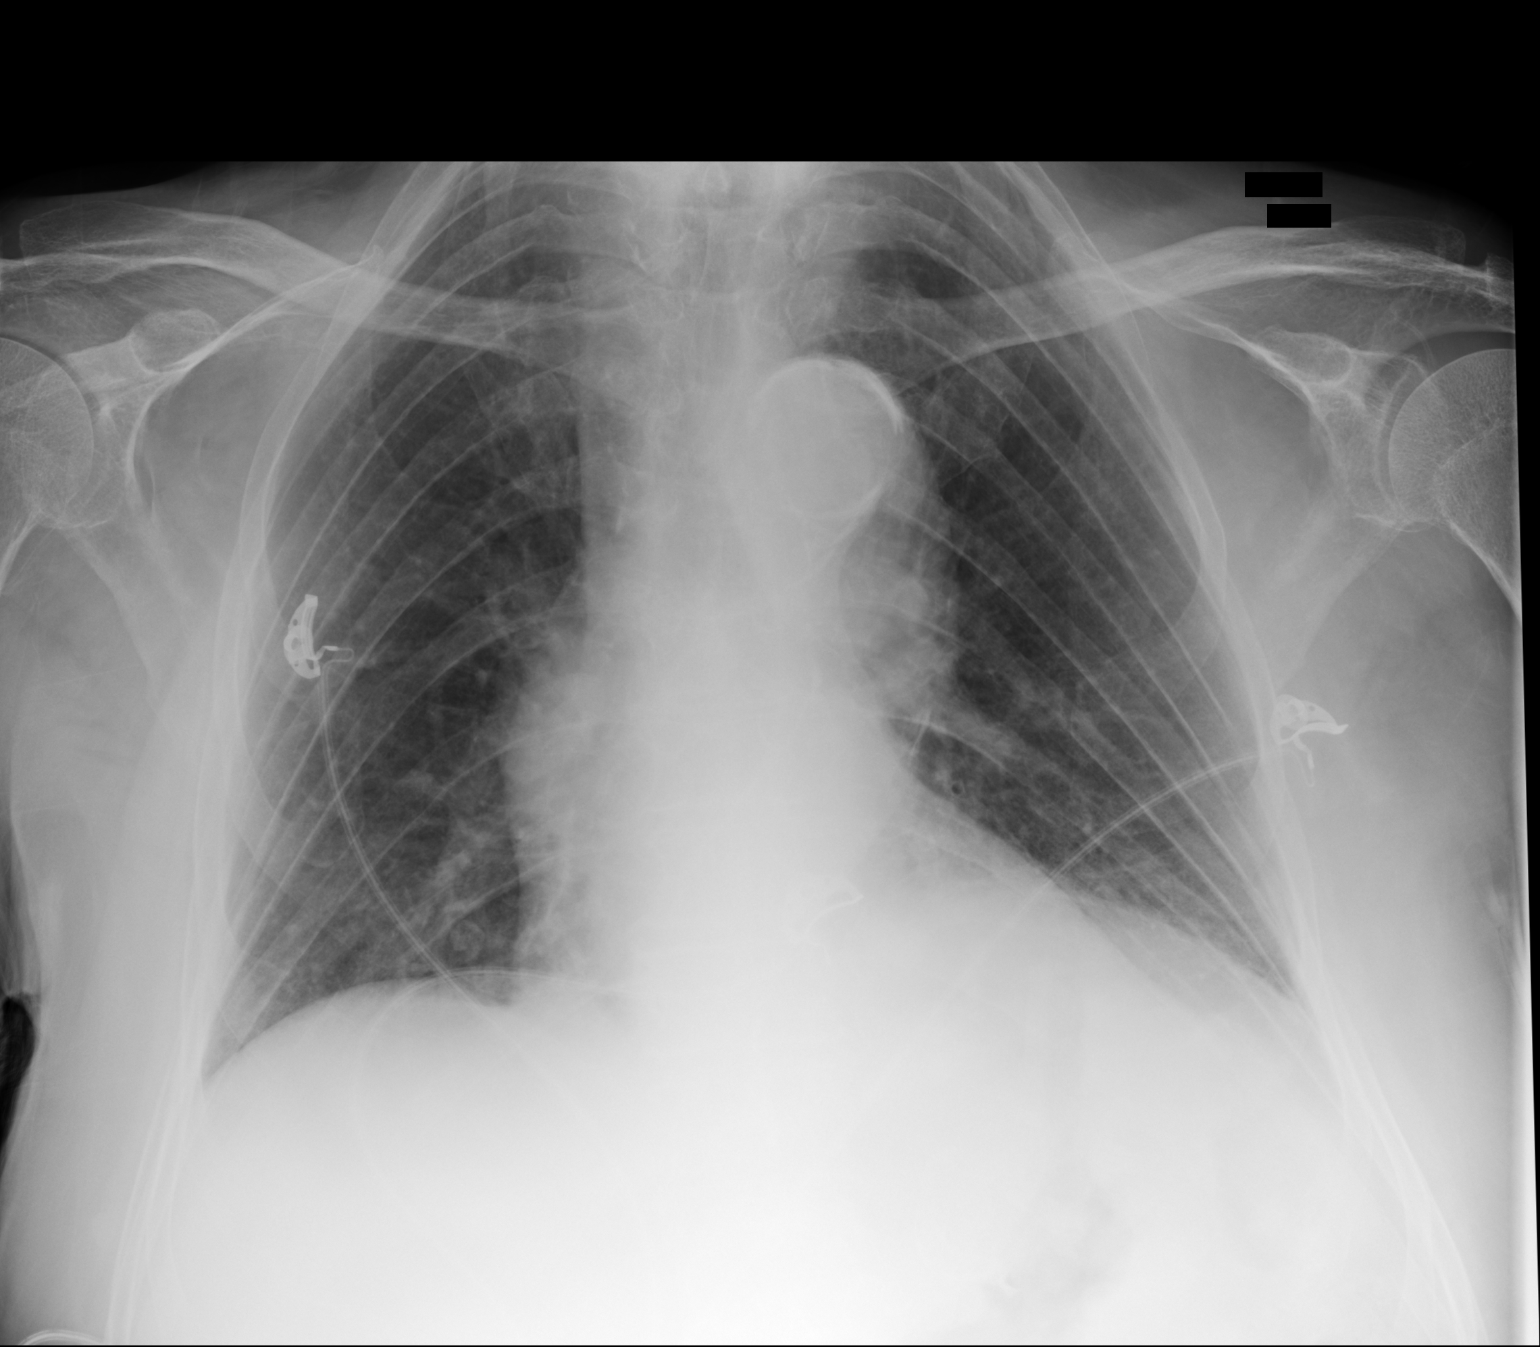

[1 of 1 positions shown; findings below may reference images not displayed]

FINDINGS: Cardiac shadow is within normal limits. Tortuosity of the thoracic
aorta is seen as well as mild calcification of the aorta. The lungs
are clear. No bony abnormality is seen.
IMPRESSION: No acute abnormality seen.

## 2015-06-21 ENCOUNTER — Other Ambulatory Visit
Admission: RE | Admit: 2015-06-21 | Discharge: 2015-06-21 | Disposition: A | Discharge status: Home / Self Care | Payer: Medicare Other | Source: Other Acute Inpatient Hospital | Attending: Family Medicine | Admitting: Family Medicine

## 2015-06-21 DIAGNOSIS — D649 Anemia, unspecified: Secondary | ICD-10-CM | POA: Diagnosis present

## 2015-06-21 LAB — COMPREHENSIVE METABOLIC PANEL
ALBUMIN: 3.3 g/dL — AB (ref 3.5–5.0)
ALT: 12 U/L — AB (ref 17–63)
AST: 25 U/L (ref 15–41)
Alkaline Phosphatase: 101 U/L (ref 38–126)
Anion gap: 9 (ref 5–15)
BUN: 28 mg/dL — AB (ref 6–20)
CHLORIDE: 102 mmol/L (ref 101–111)
CO2: 26 mmol/L (ref 22–32)
CREATININE: 0.73 mg/dL (ref 0.61–1.24)
Calcium: 9.2 mg/dL (ref 8.9–10.3)
GFR calc Af Amer: >60 mL/min (ref >60)
GFR calc non Af Amer: >60 mL/min (ref >60)
Glucose, Bld: 192 mg/dL — ABNORMAL HIGH (ref 65–99)
Potassium: 4.2 mmol/L (ref 3.5–5.1)
SODIUM: 137 mmol/L (ref 135–145)
Total Bilirubin: 0.2 mg/dL — ABNORMAL LOW (ref 0.3–1.2)
Total Protein: 6.6 g/dL (ref 6.5–8.1)

## 2015-06-21 LAB — CBC
HCT: 36.3 % — ABNORMAL LOW (ref 40.0–52.0)
Hemoglobin: 11.9 g/dL — ABNORMAL LOW (ref 13.0–18.0)
MCH: 31 pg (ref 26.0–34.0)
MCHC: 32.8 g/dL (ref 32.0–36.0)
MCV: 94.4 fL (ref 80.0–100.0)
PLATELETS: 116 10*3/uL — AB (ref 150–440)
RBC: 3.84 MIL/uL — AB (ref 4.40–5.90)
RDW: 15 % — ABNORMAL HIGH (ref 11.5–14.5)
WBC: 7.5 10*3/uL (ref 3.8–10.6)

## 2015-06-21 LAB — DIFFERENTIAL
Basophils Absolute: 0 10*3/uL (ref 0–0.1)
Basophils Relative: 0 %
EOS PCT: 4 %
Eosinophils Absolute: 0.3 10*3/uL (ref 0–0.7)
LYMPHS ABS: 2.2 10*3/uL (ref 1.0–3.6)
LYMPHS PCT: 29 %
MONO ABS: 0.6 10*3/uL (ref 0.2–1.0)
Monocytes Relative: 8 %
NEUTROS ABS: 4.6 10*3/uL (ref 1.4–6.5)
NEUTROS PCT: 59 %

## 2015-06-21 LAB — PSA: PSA: 0.48 ng/mL (ref 0.00–4.00)

## 2015-07-11 ENCOUNTER — Inpatient Hospital Stay
Admission: EM | Admit: 2015-07-11 | Discharge: 2015-07-14 | DRG: 189 | Disposition: A | Discharge status: Skilled Nursing Facility | Payer: Non-veteran care | Attending: Internal Medicine | Admitting: Internal Medicine

## 2015-07-11 ENCOUNTER — Emergency Department: Payer: Non-veteran care

## 2015-07-11 ENCOUNTER — Encounter: Payer: Self-pay | Admitting: Radiology

## 2015-07-11 DIAGNOSIS — J9621 Acute and chronic respiratory failure with hypoxia: Principal | ICD-10-CM | POA: Diagnosis present

## 2015-07-11 DIAGNOSIS — Z8673 Personal history of transient ischemic attack (TIA), and cerebral infarction without residual deficits: Secondary | ICD-10-CM

## 2015-07-11 DIAGNOSIS — Z794 Long term (current) use of insulin: Secondary | ICD-10-CM

## 2015-07-11 DIAGNOSIS — Z7952 Long term (current) use of systemic steroids: Secondary | ICD-10-CM

## 2015-07-11 DIAGNOSIS — Z66 Do not resuscitate: Secondary | ICD-10-CM | POA: Diagnosis present

## 2015-07-11 DIAGNOSIS — N400 Enlarged prostate without lower urinary tract symptoms: Secondary | ICD-10-CM | POA: Diagnosis present

## 2015-07-11 DIAGNOSIS — Z882 Allergy status to sulfonamides status: Secondary | ICD-10-CM

## 2015-07-11 DIAGNOSIS — Z79899 Other long term (current) drug therapy: Secondary | ICD-10-CM

## 2015-07-11 DIAGNOSIS — E871 Hypo-osmolality and hyponatremia: Secondary | ICD-10-CM | POA: Diagnosis present

## 2015-07-11 DIAGNOSIS — J441 Chronic obstructive pulmonary disease with (acute) exacerbation: Secondary | ICD-10-CM | POA: Diagnosis present

## 2015-07-11 DIAGNOSIS — Z91041 Radiographic dye allergy status: Secondary | ICD-10-CM

## 2015-07-11 DIAGNOSIS — F0150 Vascular dementia without behavioral disturbance: Secondary | ICD-10-CM | POA: Diagnosis present

## 2015-07-11 DIAGNOSIS — Z888 Allergy status to other drugs, medicaments and biological substances status: Secondary | ICD-10-CM

## 2015-07-11 DIAGNOSIS — L89899 Pressure ulcer of other site, unspecified stage: Secondary | ICD-10-CM | POA: Diagnosis present

## 2015-07-11 DIAGNOSIS — E11621 Type 2 diabetes mellitus with foot ulcer: Secondary | ICD-10-CM | POA: Diagnosis present

## 2015-07-11 DIAGNOSIS — J9811 Atelectasis: Secondary | ICD-10-CM

## 2015-07-11 DIAGNOSIS — F4310 Post-traumatic stress disorder, unspecified: Secondary | ICD-10-CM | POA: Diagnosis present

## 2015-07-11 DIAGNOSIS — F0390 Unspecified dementia without behavioral disturbance: Secondary | ICD-10-CM | POA: Diagnosis present

## 2015-07-11 DIAGNOSIS — F419 Anxiety disorder, unspecified: Secondary | ICD-10-CM | POA: Diagnosis present

## 2015-07-11 DIAGNOSIS — R064 Hyperventilation: Secondary | ICD-10-CM | POA: Diagnosis present

## 2015-07-11 DIAGNOSIS — R0902 Hypoxemia: Secondary | ICD-10-CM | POA: Diagnosis not present

## 2015-07-11 DIAGNOSIS — L8990 Pressure ulcer of unspecified site, unspecified stage: Secondary | ICD-10-CM | POA: Insufficient documentation

## 2015-07-11 DIAGNOSIS — Z91030 Bee allergy status: Secondary | ICD-10-CM

## 2015-07-11 DIAGNOSIS — Z880 Allergy status to penicillin: Secondary | ICD-10-CM

## 2015-07-11 DIAGNOSIS — Z9981 Dependence on supplemental oxygen: Secondary | ICD-10-CM

## 2015-07-11 DIAGNOSIS — I10 Essential (primary) hypertension: Secondary | ICD-10-CM | POA: Diagnosis present

## 2015-07-11 DIAGNOSIS — E039 Hypothyroidism, unspecified: Secondary | ICD-10-CM | POA: Diagnosis present

## 2015-07-11 DIAGNOSIS — Z7982 Long term (current) use of aspirin: Secondary | ICD-10-CM

## 2015-07-11 DIAGNOSIS — J849 Interstitial pulmonary disease, unspecified: Secondary | ICD-10-CM | POA: Diagnosis present

## 2015-07-11 DIAGNOSIS — Z96649 Presence of unspecified artificial hip joint: Secondary | ICD-10-CM | POA: Diagnosis present

## 2015-07-11 LAB — CBC
HCT: 39.3 % — ABNORMAL LOW (ref 40.0–52.0)
HEMOGLOBIN: 12.9 g/dL — AB (ref 13.0–18.0)
MCH: 31 pg (ref 26.0–34.0)
MCHC: 32.8 g/dL (ref 32.0–36.0)
MCV: 94.5 fL (ref 80.0–100.0)
PLATELETS: 108 10*3/uL — AB (ref 150–440)
RBC: 4.16 MIL/uL — AB (ref 4.40–5.90)
RDW: 15 % — ABNORMAL HIGH (ref 11.5–14.5)
WBC: 10.8 10*3/uL — AB (ref 3.8–10.6)

## 2015-07-11 LAB — BASIC METABOLIC PANEL
ANION GAP: 13 (ref 5–15)
BUN: 31 mg/dL — ABNORMAL HIGH (ref 6–20)
CO2: 23 mmol/L (ref 22–32)
Calcium: 9.5 mg/dL (ref 8.9–10.3)
Chloride: 97 mmol/L — ABNORMAL LOW (ref 101–111)
Creatinine, Ser: 0.85 mg/dL (ref 0.61–1.24)
GFR calc Af Amer: >60 mL/min (ref >60)
GLUCOSE: 146 mg/dL — AB (ref 65–99)
POTASSIUM: 4.2 mmol/L (ref 3.5–5.1)
Sodium: 133 mmol/L — ABNORMAL LOW (ref 135–145)

## 2015-07-11 LAB — TROPONIN I

## 2015-07-11 MED ORDER — IOHEXOL 350 MG/ML SOLN
100.0000 mL | Freq: Once | INTRAVENOUS | Status: AC | PRN
  Administered 2015-07-11: 100 mL via INTRAVENOUS

## 2015-07-11 MED ORDER — METHYLPREDNISOLONE SODIUM SUCC 125 MG IJ SOLR
125.0000 mg | Freq: Once | INTRAMUSCULAR | Status: AC
  Administered 2015-07-11: 125 mg via INTRAVENOUS
  Filled 2015-07-11: qty 2

## 2015-07-11 MED ORDER — DIPHENHYDRAMINE HCL 50 MG/ML IJ SOLN
50.0000 mg | Freq: Once | INTRAMUSCULAR | Status: AC
  Administered 2015-07-11: 50 mg via INTRAVENOUS
  Filled 2015-07-11: qty 1

## 2015-07-11 NOTE — ED Notes (Signed)
Patient presents via EMS from [facility]. EMS called out in reference to patient being hypoxic. SPO2 81% on RA. Patient with no increased WOB noted; denies pain. The only c/o at present is "double vision" x 1 week.

## 2015-07-11 NOTE — ED Provider Notes (Signed)
Time Seen: Approximately ----------------------------------------- 11:06 PM on 07/11/2015 -----------------------------------------    I have reviewed the triage notes  Chief Complaint: Shortness of Breath   History of Present Illness: [name] is a 81 y.o. male who is transferred here from local nursing facility [facility]) for evaluation of hypoxia. His daughter is primary historian since the patient has dementia states he's always been slightly short of breath and has occasionally been on supplemental oxygen. He was noted that his pulse oximetry had decreased though the patient himself has had no physical complaints of chest pain or shortness of breath. Found to be at a sat of 81% on room air and was placed on a 5 L nasal cannula and then a nonrebreather. He does not appear to be in respiratory distress at this time. Patient is not ambulatory and has a history of pulmonary embolism after he had surgical repair of a hip fracture. He is not currently on any anticoagulation therapy. The patient himself has no physical complaints of chest, abdominal or leg pain. Given the patient's history of dementia and most of his history is accumulative from the family, medical record, and nursing facility.  Past Medical History  Diagnosis Date  . Diabetes mellitus   . CVA (cerebral infarction)   . Hypertension   . GI bleed   . PTSD (post-traumatic stress disorder)   . Vascular dementia   . MDD (major depressive disorder)     w/ paranoia    Patient Active Problem List   Diagnosis Date Noted  . Fall at nursing home 11/06/2013  . Hip fracture, right 11/06/2013  . Vascular dementia   . Hypertension   . Muscle weakness (generalized) 06/11/2011  . Difficulty in walking(719.7) 06/11/2011    No past surgical history on file.  No past surgical history on file.  Current Outpatient Rx  Name  Route  Sig  Dispense  Refill  . ARIPiprazole (ABILIFY) 15 MG tablet   Oral   Take 0.5 tablets  (7.5 mg total) by mouth daily.         Marland Kitchen aspirin 81 MG chewable tablet   Oral   Chew 1 tablet (81 mg total) by mouth daily.         Marland Kitchen atorvastatin (LIPITOR) 80 MG tablet   Oral   Take 1 tablet (80 mg total) by mouth daily at 6 PM.         . docusate sodium 100 MG CAPS   Oral   Take 100 mg by mouth daily.   10 capsule   0   . gabapentin (NEURONTIN) 100 MG capsule   Oral   Take 1 capsule (100 mg total) by mouth daily.         . insulin aspart (NOVOLOG) 100 UNIT/ML injection   Subcutaneous   Inject 0-9 Units into the skin 3 (three) times daily with meals.   10 mL   11   . insulin aspart (NOVOLOG) 100 UNIT/ML injection   Subcutaneous   Inject 0-5 Units into the skin at bedtime.   10 mL   11   . magnesium oxide (MAG-OX) 400 (241.3 MG) MG tablet   Oral   Take 2 tablets (800 mg total) by mouth daily.         . metFORMIN (GLUCOPHAGE) 850 MG tablet   Oral   Take 1 tablet (850 mg total) by mouth 2 (two) times daily with a meal.         . nitroGLYCERIN (  NITROSTAT) 0.4 MG SL tablet   Sublingual   Place 1 tablet (0.4 mg total) under the tongue every 5 (five) minutes as needed for chest pain.      12   . pantoprazole (PROTONIX) 40 MG tablet   Oral   Take 1 tablet (40 mg total) by mouth daily.         Marland Kitchen venlafaxine XR (EFFEXOR-XR) 150 MG 24 hr capsule   Oral   Take 1 capsule (150 mg total) by mouth daily with breakfast.           Allergies:  Yellow jacket venom; Betadine prepstick plus; Cortisone; Penicillins; and Sulfa antibiotics  Family History: No family history on file.  Social History: Social History  Substance Use Topics  . Smoking status: Never Smoker   . Smokeless tobacco: Not on file  . Alcohol Use: No     Review of Systems:   10 point review of systems was performed and was otherwise negative:  Constitutional: No fever Eyes: No visual disturbances ENT: No sore throat, ear pain Cardiac: No chest pain Respiratory: No shortness  of breath, wheezing, or stridor Abdomen: No abdominal pain, no vomiting, No diarrhea Endocrine: No weight loss, No night sweats Extremities: No peripheral edema, cyanosis Skin: No rashes, easy bruising Neurologic: Patient is nonambulatory since his hip fracture repair. His family states he underwent rehabilitation but is really not walk since that time. Urologic: No dysuria, Hematuria, or urinary frequency Review of systems is taken through family and the medical record  Physical Exam:  ED Triage Vitals  Enc Vitals Group     BP 07/11/15 2130 105/78 mmHg     Pulse Rate 07/11/15 2130 96     Resp 07/11/15 2130 22     Temp 07/11/15 2130 97 F (36.1 C)     Temp Source 07/11/15 2130 Oral     SpO2 07/11/15 2130 81 %     Weight 07/11/15 2130 170 lb (77.111 kg)     Height 07/11/15 2130  (1.905 m)     Head Cir --      Peak Flow --      Pain Score 07/11/15 2131 0     Pain Loc --      Pain Edu? --      Excl. in GC? --     General: Awake , Alert , and Oriented times 2. Patient does not appear to be in significant respiratory distress. Head: Normal cephalic , atraumatic Eyes: Pupils equal , round, reactive to light Nose/Throat: No nasal drainage, patent upper airway without erythema or exudate.  Neck: Supple, Full range of motion, No anterior adenopathy or palpable thyroid masses Lungs: Clear to ascultation without wheezes , rhonchi, or rales. Limited air movement bilaterally at the bases. Heart: Regular rate, regular rhythm without murmurs , gallops , or rubs Abdomen: Soft, non tender without rebound, guarding , or rigidity; bowel sounds positive and symmetric in all 4 quadrants. No organomegaly .        Extremities: Slightly withdrawn with poor muscle tone. Neurologic: normal ambulation, Motor symmetric without deficits, sensory intact Skin: warm, dry, no rashes   Labs:   All laboratory work was reviewed including any pertinent negatives or positives listed below:  Labs  Reviewed  CBC - Abnormal; Notable for the following:    WBC 10.8 (*)    RBC 4.16 (*)    Hemoglobin 12.9 (*)    HCT 39.3 (*)    RDW 15.0 (*)  Platelets 108 (*)    All other components within normal limits  BASIC METABOLIC PANEL - Abnormal; Notable for the following:    Sodium 133 (*)    Chloride 97 (*)    Glucose, Bld 146 (*)    BUN 31 (*)    All other components within normal limits  TROPONIN I    EKG:   ED ECG REPORT I, Jennye Moccasin, the attending physician, personally viewed and interpreted this ECG.  Date: 07/11/2015 EKG Time: 2136 Rate: 96 Rhythm: normal sinus rhythm QRS Axis: normal Intervals: normal ST/T Wave abnormalities: Nonspecific T wave abnormality Conduction Disutrbances: Prolonged QT interval Narrative Interpretation: unremarkable   Radiology:  EXAM: CHEST 2 VIEW  COMPARISON: 05/27/2014  FINDINGS: Normal heart size and stable prominent aortic tortuosity. No aneurysm noted on 2012 chest CT. Diffuse atherosclerotic calcification, including the coronary arteries. There is no edema, consolidation, effusion, or pneumothorax.  IMPRESSION: No evidence of acute cardiopulmonary disease  Chest CT is pending I personally reviewed the radiologic studies     ED Course:   Records show a possible reaction to contrast I note written in 2009. Patient underwent prepped with Solu-Medrol and Benadryl IV.   Assessment:  Acute hypoxia Possible pulmonary embolism   Final Clinical Impression  Final diagnoses:  Hypoxia     Plan: Patient will be checked out to my colleague to follow the results of the CT scan which I suspect will be positive for pulmonary embolism. Patient will require transfer to the [facility] in that paperwork is been initiated.          Jennye Moccasin, MD 07/11/15 [phone]

## 2015-07-11 NOTE — ED Notes (Signed)
Patient's supplemental oxygen flow rate has been titrated from 2 to 5L/[name]; persistently HYPOxic in the mid 80s. MD and primary nurse made aware.

## 2015-07-11 NOTE — ED Notes (Signed)
Pt placed on NRB and SP02 is 86%

## 2015-07-11 NOTE — ED Notes (Signed)
MD at bedside. 

## 2015-07-12 ENCOUNTER — Encounter: Payer: Self-pay | Admitting: Internal Medicine

## 2015-07-12 DIAGNOSIS — Z794 Long term (current) use of insulin: Secondary | ICD-10-CM | POA: Diagnosis not present

## 2015-07-12 DIAGNOSIS — L8990 Pressure ulcer of unspecified site, unspecified stage: Secondary | ICD-10-CM | POA: Insufficient documentation

## 2015-07-12 DIAGNOSIS — Z9981 Dependence on supplemental oxygen: Secondary | ICD-10-CM | POA: Diagnosis not present

## 2015-07-12 DIAGNOSIS — E11621 Type 2 diabetes mellitus with foot ulcer: Secondary | ICD-10-CM | POA: Diagnosis present

## 2015-07-12 DIAGNOSIS — Z79899 Other long term (current) drug therapy: Secondary | ICD-10-CM | POA: Diagnosis not present

## 2015-07-12 DIAGNOSIS — J441 Chronic obstructive pulmonary disease with (acute) exacerbation: Secondary | ICD-10-CM | POA: Diagnosis present

## 2015-07-12 DIAGNOSIS — Z66 Do not resuscitate: Secondary | ICD-10-CM | POA: Diagnosis present

## 2015-07-12 DIAGNOSIS — Z7952 Long term (current) use of systemic steroids: Secondary | ICD-10-CM | POA: Diagnosis not present

## 2015-07-12 DIAGNOSIS — Z882 Allergy status to sulfonamides status: Secondary | ICD-10-CM | POA: Diagnosis not present

## 2015-07-12 DIAGNOSIS — R0902 Hypoxemia: Secondary | ICD-10-CM | POA: Diagnosis present

## 2015-07-12 DIAGNOSIS — Z91041 Radiographic dye allergy status: Secondary | ICD-10-CM | POA: Diagnosis not present

## 2015-07-12 DIAGNOSIS — R064 Hyperventilation: Secondary | ICD-10-CM | POA: Diagnosis present

## 2015-07-12 DIAGNOSIS — L89899 Pressure ulcer of other site, unspecified stage: Secondary | ICD-10-CM | POA: Diagnosis present

## 2015-07-12 DIAGNOSIS — F0390 Unspecified dementia without behavioral disturbance: Secondary | ICD-10-CM | POA: Diagnosis present

## 2015-07-12 DIAGNOSIS — I10 Essential (primary) hypertension: Secondary | ICD-10-CM | POA: Diagnosis present

## 2015-07-12 DIAGNOSIS — F0150 Vascular dementia without behavioral disturbance: Secondary | ICD-10-CM | POA: Diagnosis present

## 2015-07-12 DIAGNOSIS — Z8673 Personal history of transient ischemic attack (TIA), and cerebral infarction without residual deficits: Secondary | ICD-10-CM | POA: Diagnosis not present

## 2015-07-12 DIAGNOSIS — J849 Interstitial pulmonary disease, unspecified: Secondary | ICD-10-CM | POA: Diagnosis present

## 2015-07-12 DIAGNOSIS — E871 Hypo-osmolality and hyponatremia: Secondary | ICD-10-CM | POA: Diagnosis present

## 2015-07-12 DIAGNOSIS — Z888 Allergy status to other drugs, medicaments and biological substances status: Secondary | ICD-10-CM | POA: Diagnosis not present

## 2015-07-12 DIAGNOSIS — Z96649 Presence of unspecified artificial hip joint: Secondary | ICD-10-CM | POA: Diagnosis present

## 2015-07-12 DIAGNOSIS — Z880 Allergy status to penicillin: Secondary | ICD-10-CM | POA: Diagnosis not present

## 2015-07-12 DIAGNOSIS — F419 Anxiety disorder, unspecified: Secondary | ICD-10-CM | POA: Diagnosis present

## 2015-07-12 DIAGNOSIS — F4310 Post-traumatic stress disorder, unspecified: Secondary | ICD-10-CM | POA: Diagnosis present

## 2015-07-12 DIAGNOSIS — J9621 Acute and chronic respiratory failure with hypoxia: Secondary | ICD-10-CM | POA: Diagnosis present

## 2015-07-12 DIAGNOSIS — Z91030 Bee allergy status: Secondary | ICD-10-CM | POA: Diagnosis not present

## 2015-07-12 DIAGNOSIS — Z7982 Long term (current) use of aspirin: Secondary | ICD-10-CM | POA: Diagnosis not present

## 2015-07-12 DIAGNOSIS — E039 Hypothyroidism, unspecified: Secondary | ICD-10-CM | POA: Diagnosis present

## 2015-07-12 DIAGNOSIS — N400 Enlarged prostate without lower urinary tract symptoms: Secondary | ICD-10-CM | POA: Diagnosis present

## 2015-07-12 LAB — URINALYSIS COMPLETE WITH MICROSCOPIC (ARMC ONLY)
BILIRUBIN URINE: NEGATIVE
Bacteria, UA: NONE SEEN
Glucose, UA: NEGATIVE mg/dL
HGB URINE DIPSTICK: NEGATIVE
KETONES UR: NEGATIVE mg/dL
Nitrite: NEGATIVE
PH: 5 (ref 5.0–8.0)
Protein, ur: NEGATIVE mg/dL
Specific Gravity, Urine: 1.042 — ABNORMAL HIGH (ref 1.005–1.030)

## 2015-07-12 LAB — BLOOD GAS, ARTERIAL
Acid-Base Excess: 0.5 mmol/L (ref 0.0–3.0)
Allens test (pass/fail): POSITIVE — AB
BICARBONATE: 24.6 meq/L (ref 21.0–28.0)
FIO2: 1
O2 Saturation: 92.4 %
PH ART: 7.43 (ref 7.350–7.450)
PO2 ART: 63 mmHg — AB (ref 83.0–108.0)
Patient temperature: 37
pCO2 arterial: 37 mmHg (ref 32.0–48.0)

## 2015-07-12 LAB — C DIFFICILE QUICK SCREEN W PCR REFLEX
C DIFFICILE (CDIFF) INTERP: NEGATIVE
C Diff antigen: NEGATIVE
C Diff toxin: NEGATIVE

## 2015-07-12 LAB — GLUCOSE, CAPILLARY
Glucose-Capillary: 191 mg/dL — ABNORMAL HIGH (ref 65–99)
Glucose-Capillary: 206 mg/dL — ABNORMAL HIGH (ref 65–99)
Glucose-Capillary: 98 mg/dL (ref 65–99)
Glucose-Capillary: 193 mg/dL — ABNORMAL HIGH (ref 65–99)

## 2015-07-12 LAB — TSH: TSH: 6.079 u[IU]/mL — ABNORMAL HIGH (ref 0.350–4.500)

## 2015-07-12 LAB — HEMOGLOBIN A1C: Hgb A1c MFr Bld: 6.2 % — ABNORMAL HIGH (ref 4.0–6.0)

## 2015-07-12 LAB — MRSA PCR SCREENING: MRSA by PCR: NEGATIVE

## 2015-07-12 MED ORDER — TERBINAFINE HCL 1 % EX CREA: 1.0000 "application " | TOPICAL_CREAM | Freq: Two times a day (BID) | CUTANEOUS | Status: DC | PRN

## 2015-07-12 MED ORDER — MORPHINE SULFATE (PF) 2 MG/ML IV SOLN: 2.0000 mg | INTRAVENOUS | Status: DC | PRN

## 2015-07-12 MED ORDER — ADULT MULTIVITAMIN W/MINERALS CH
1.0000 | ORAL_TABLET | Freq: Every day | ORAL | Status: DC
  Administered 2015-07-12 – 2015-07-14 (×3): 1 via ORAL
  Filled 2015-07-12 (×3): qty 1

## 2015-07-12 MED ORDER — NITROGLYCERIN 0.4 MG SL SUBL: 0.4000 mg | SUBLINGUAL_TABLET | SUBLINGUAL | Status: DC | PRN

## 2015-07-12 MED ORDER — ONDANSETRON HCL 4 MG/2ML IJ SOLN: 4.0000 mg | Freq: Four times a day (QID) | INTRAMUSCULAR | Status: DC | PRN

## 2015-07-12 MED ORDER — ACETAMINOPHEN 325 MG PO TABS: 650.0000 mg | ORAL_TABLET | Freq: Four times a day (QID) | ORAL | Status: DC | PRN

## 2015-07-12 MED ORDER — MIRTAZAPINE 15 MG PO TABS
15.0000 mg | ORAL_TABLET | Freq: Every day | ORAL | Status: DC
  Administered 2015-07-12 – 2015-07-13 (×2): 15 mg via ORAL
  Filled 2015-07-12 (×2): qty 1

## 2015-07-12 MED ORDER — METHYLPREDNISOLONE SODIUM SUCC 125 MG IJ SOLR
60.0000 mg | Freq: Four times a day (QID) | INTRAMUSCULAR | Status: DC
  Administered 2015-07-12 – 2015-07-13 (×6): 60 mg via INTRAVENOUS
  Filled 2015-07-12 (×6): qty 2

## 2015-07-12 MED ORDER — THEOPHYLLINE ER 100 MG PO TB12
100.0000 mg | ORAL_TABLET | Freq: Every evening | ORAL | Status: DC
  Administered 2015-07-12 – 2015-07-13 (×2): 100 mg via ORAL
  Filled 2015-07-12 (×3): qty 1

## 2015-07-12 MED ORDER — VITAMIN B-12 1000 MCG PO TABS
1000.0000 ug | ORAL_TABLET | Freq: Every day | ORAL | Status: DC
  Administered 2015-07-12 – 2015-07-14 (×3): 1000 ug via ORAL
  Filled 2015-07-12 (×3): qty 1

## 2015-07-12 MED ORDER — POLYETHYLENE GLYCOL 3350 17 G PO PACK: 17.0000 g | PACK | Freq: Two times a day (BID) | ORAL | Status: DC | PRN

## 2015-07-12 MED ORDER — VENLAFAXINE HCL ER 75 MG PO CP24
150.0000 mg | ORAL_CAPSULE | Freq: Every day | ORAL | Status: DC
  Filled 2015-07-12 (×2): qty 2

## 2015-07-12 MED ORDER — SODIUM CHLORIDE 0.9 % IJ SOLN
3.0000 mL | INTRAMUSCULAR | Status: DC | PRN
  Administered 2015-07-12 – 2015-07-13 (×4): 3 mL via INTRAVENOUS
  Filled 2015-07-12 (×4): qty 10

## 2015-07-12 MED ORDER — INSULIN ASPART 100 UNIT/ML ~~LOC~~ SOLN
0.0000 [IU] | Freq: Three times a day (TID) | SUBCUTANEOUS | Status: DC
  Administered 2015-07-12: 2 [IU] via SUBCUTANEOUS
  Administered 2015-07-12 – 2015-07-13 (×2): 3 [IU] via SUBCUTANEOUS
  Administered 2015-07-13: 1 [IU] via SUBCUTANEOUS
  Administered 2015-07-13: 2 [IU] via SUBCUTANEOUS
  Administered 2015-07-14: 1 [IU] via SUBCUTANEOUS
  Administered 2015-07-14: 2 [IU] via SUBCUTANEOUS
  Filled 2015-07-12: qty 1
  Filled 2015-07-12: qty 2
  Filled 2015-07-12: qty 3
  Filled 2015-07-12: qty 2
  Filled 2015-07-12: qty 1
  Filled 2015-07-12: qty 2
  Filled 2015-07-12: qty 3

## 2015-07-12 MED ORDER — TAMSULOSIN HCL 0.4 MG PO CAPS
0.4000 mg | ORAL_CAPSULE | Freq: Every day | ORAL | Status: DC
  Administered 2015-07-12 – 2015-07-13 (×2): 0.4 mg via ORAL
  Filled 2015-07-12 (×2): qty 1

## 2015-07-12 MED ORDER — BUDESONIDE-FORMOTEROL FUMARATE 160-4.5 MCG/ACT IN AERO
2.0000 | INHALATION_SPRAY | Freq: Two times a day (BID) | RESPIRATORY_TRACT | Status: DC
Start: 2015-07-12 — End: 2015-07-14
  Administered 2015-07-12 – 2015-07-14 (×4): 2 via RESPIRATORY_TRACT
  Filled 2015-07-12: qty 6

## 2015-07-12 MED ORDER — LEVOTHYROXINE SODIUM 50 MCG PO TABS
25.0000 ug | ORAL_TABLET | ORAL | Status: DC
  Administered 2015-07-13 – 2015-07-14 (×2): 25 ug via ORAL
  Filled 2015-07-12: qty 1
  Filled 2015-07-12: qty 2

## 2015-07-12 MED ORDER — HEPARIN SODIUM (PORCINE) 5000 UNIT/ML IJ SOLN
5000.0000 [IU] | Freq: Three times a day (TID) | INTRAMUSCULAR | Status: DC
  Administered 2015-07-12 – 2015-07-14 (×7): 5000 [IU] via SUBCUTANEOUS
  Filled 2015-07-12 (×7): qty 1

## 2015-07-12 MED ORDER — IPRATROPIUM-ALBUTEROL 0.5-2.5 (3) MG/3ML IN SOLN: 3.0000 mL | RESPIRATORY_TRACT | Status: DC | PRN

## 2015-07-12 MED ORDER — TIOTROPIUM BROMIDE MONOHYDRATE 18 MCG IN CAPS
18.0000 ug | ORAL_CAPSULE | Freq: Every day | RESPIRATORY_TRACT | Status: DC
  Administered 2015-07-12 – 2015-07-14 (×3): 18 ug via RESPIRATORY_TRACT
  Filled 2015-07-12: qty 5

## 2015-07-12 MED ORDER — VENLAFAXINE HCL ER 75 MG PO CP24: 75.0000 mg | ORAL_CAPSULE | Freq: Every day | ORAL | Status: DC

## 2015-07-12 MED ORDER — MAGNESIUM OXIDE 400 (241.3 MG) MG PO TABS
800.0000 mg | ORAL_TABLET | Freq: Every day | ORAL | Status: DC
  Administered 2015-07-12 – 2015-07-14 (×3): 800 mg via ORAL
  Filled 2015-07-12 (×3): qty 2

## 2015-07-12 MED ORDER — SENNOSIDES-DOCUSATE SODIUM 8.6-50 MG PO TABS
2.0000 | ORAL_TABLET | Freq: Two times a day (BID) | ORAL | Status: DC
  Administered 2015-07-12 – 2015-07-14 (×3): 2 via ORAL
  Filled 2015-07-12 (×4): qty 2

## 2015-07-12 MED ORDER — VENLAFAXINE HCL ER 75 MG PO CP24
225.0000 mg | ORAL_CAPSULE | Freq: Every day | ORAL | Status: DC
  Filled 2015-07-12 (×2): qty 1

## 2015-07-12 MED ORDER — BISACODYL 5 MG PO TBEC
10.0000 mg | DELAYED_RELEASE_TABLET | Freq: Every day | ORAL | Status: DC
  Administered 2015-07-14: 10 mg via ORAL
  Filled 2015-07-12 (×2): qty 2

## 2015-07-12 MED ORDER — THEOPHYLLINE ER 100 MG PO TB12
200.0000 mg | ORAL_TABLET | ORAL | Status: DC
  Administered 2015-07-13 – 2015-07-14 (×2): 200 mg via ORAL
  Filled 2015-07-12 (×2): qty 2

## 2015-07-12 MED ORDER — MORPHINE SULFATE (PF) 2 MG/ML IV SOLN
INTRAVENOUS | Status: AC
  Filled 2015-07-12: qty 1

## 2015-07-12 MED ORDER — OMEGA-3-ACID ETHYL ESTERS 1 G PO CAPS
1.0000 g | ORAL_CAPSULE | Freq: Every day | ORAL | Status: DC
  Administered 2015-07-12 – 2015-07-14 (×3): 1 g via ORAL
  Filled 2015-07-12 (×4): qty 1

## 2015-07-12 MED ORDER — ACETAMINOPHEN 650 MG RE SUPP: 650.0000 mg | Freq: Four times a day (QID) | RECTAL | Status: DC | PRN

## 2015-07-12 MED ORDER — INFLUENZA VAC SPLIT QUAD 0.5 ML IM SUSY
0.5000 mL | PREFILLED_SYRINGE | INTRAMUSCULAR | Status: AC
Start: 2015-07-13 — End: 2015-07-13
  Administered 2015-07-13: 0.5 mL via INTRAMUSCULAR
  Filled 2015-07-12: qty 0.5

## 2015-07-12 MED ORDER — ATORVASTATIN CALCIUM 20 MG PO TABS
20.0000 mg | ORAL_TABLET | Freq: Every day | ORAL | Status: DC
  Administered 2015-07-12 – 2015-07-13 (×2): 20 mg via ORAL
  Filled 2015-07-12 (×2): qty 1

## 2015-07-12 MED ORDER — LORATADINE 10 MG PO TABS
10.0000 mg | ORAL_TABLET | Freq: Every day | ORAL | Status: DC
  Administered 2015-07-12 – 2015-07-14 (×3): 10 mg via ORAL
  Filled 2015-07-12 (×4): qty 1

## 2015-07-12 MED ORDER — CARBAMIDE PEROXIDE 6.5 % OT SOLN: 10.0000 [drp] | Freq: Two times a day (BID) | OTIC | Status: DC | PRN

## 2015-07-12 MED ORDER — INSULIN ASPART 100 UNIT/ML ~~LOC~~ SOLN
0.0000 [IU] | Freq: Every day | SUBCUTANEOUS | Status: DC
Start: 2015-07-12 — End: 2015-07-14

## 2015-07-12 MED ORDER — ONDANSETRON HCL 4 MG PO TABS: 4.0000 mg | ORAL_TABLET | Freq: Four times a day (QID) | ORAL | Status: DC | PRN

## 2015-07-12 MED ORDER — ONDANSETRON HCL 4 MG/2ML IJ SOLN
INTRAMUSCULAR | Status: AC
  Filled 2015-07-12: qty 2

## 2015-07-12 MED ORDER — VENLAFAXINE HCL ER 75 MG PO CP24
225.0000 mg | ORAL_CAPSULE | Freq: Every day | ORAL | Status: DC
  Administered 2015-07-12 – 2015-07-14 (×3): 225 mg via ORAL
  Filled 2015-07-12 (×5): qty 3

## 2015-07-12 MED ORDER — GABAPENTIN 300 MG PO CAPS
300.0000 mg | ORAL_CAPSULE | Freq: Two times a day (BID) | ORAL | Status: DC
  Administered 2015-07-12 – 2015-07-14 (×5): 300 mg via ORAL
  Filled 2015-07-12 (×5): qty 1

## 2015-07-12 NOTE — Care Management (Signed)
Patient presents from [facility] where he is under a long term plan of care under his VA benefit.  [facility] referral was initiated from the ED.  There is no telemetry bed available at present time

## 2015-07-12 NOTE — Clinical Social Work Note (Signed)
Clinical Social Work Assessment  Patient Details  Name: [name] MRN: [id number] Date of Birth: 02-Aug-1933  Date of referral:  07/12/15               Reason for consult:  Facility Placement (pt is from [facility] [employer])                Permission sought to share information with:  Facility Medical sales representative, Family Supports Permission granted to share information::  Yes, Verbal Permission Granted  Name::     Daughter Deb   Agency::  [employer]  Relationship::     Contact Information:     Housing/Transportation Living arrangements for the past 2 months:  Skilled Building surveyor of Information:  Patient Patient Interpreter Needed:  None Criminal Activity/Legal Involvement Pertinent to Current Situation/Hospitalization:  No - Comment as needed Significant Relationships:  Adult Children Lives with:  Facility Resident Do you feel safe going back to the place where you live?  Yes Need for family participation in patient care:  Yes (Comment)  Care giving concerns:  Pt stated that he wanted to attempt to walk again, and that he also did not like his roommate at the SNF facility because he cursed too much.  CSW will notify facility.   Social Worker assessment / plan:  CSW spoke to pt.  He was able to answer orientation question.  He stated that he was for SNF under LTC.  CSW was also able to contact [facility] and confirm that he was a long term resident.  He is also under a VA benefit.  He is almost a total care pt at facility.  A lift is used for his transfers., but he is able to feed himself.  CSW will contact pt's daughter to notify her of DC plans.     Employment status:  Disabled (Comment on whether or not currently receiving Disability), Retired Health and safety inspector:  Medicare PT Recommendations:    Information / Referral to community resources:     Patient/Family's Response to care:  Pt as in agreement with DC back to [facility].  Patient/Family's  Understanding of and Emotional Response to Diagnosis, Current Treatment, and Prognosis:  Pt verbalized his understanding of DC back to [facility] for DC and was in agreement.  Emotional Assessment Appearance:  Appears older than stated age Attitude/Demeanor/Rapport:   (coorperative.) Affect (typically observed):  Appropriate Orientation:  Oriented to Self, Oriented to Place, Oriented to  Time Alcohol / Substance use:  Never Used Psych involvement (Current and /or in the community):  No (Comment)  Discharge Needs  Concerns to be addressed:  Care Coordination Readmission within the last 30 days:  No Current discharge risk:  None Barriers to Discharge:  No Barriers Identified   Chauncy Passy, LCSW 07/12/2015, 11:43 AM

## 2015-07-12 NOTE — H&P (Signed)
[name] is an 81 y.o. male.   Chief Complaint: Shortness of breath HPI: The patient presents to the ED via EMS due to hypoxia.  He had complained of shortness of breath earlier in the day at his nursing home.  He was found to have a gradually increasing oxygen requirement which prompted evaluation in the emergency department.  He denies chest pain.  CTA chest negative for PE.  Stable AAA noted.  Patient is usually seen by the [facility] but was not accepted for transfer.  The emergency department staff called for admission to manage hypoxia.  Past Medical History  Diagnosis Date  . Diabetes mellitus   . CVA (cerebral infarction)   . Hypertension   . GI bleed   . PTSD (post-traumatic stress disorder)   . Vascular dementia   . MDD (major depressive disorder)     w/ paranoia    Past Surgical History  Procedure Laterality Date  . Total hip arthroplasty Right     No family history on file. Difficult to understand patient Social History:  reports that he has never smoked. He does not have any smokeless tobacco history on file. He reports that he does not drink alcohol or use illicit drugs.  Allergies:  Allergies  Allergen Reactions  . Yellow Jacket Venom Anaphylaxis  . Betadine Prepstick Plus Other (See Comments)    Reaction: unknown  . Contrast Media [Iodinated Diagnostic Agents] Other (See Comments)    Reaction: unknown. Patient states that he does not remember the reaction, but it happened at the [place]  . Cortisone Other (See Comments)    Reaction: unknown  . Penicillins Other (See Comments)    Reaction: unknown  . Sulfa Antibiotics Other (See Comments)    Reaction: unknown    Prior to Admission medications   Medication Sig Start Date End Date Taking? Authorizing Provider  acetaminophen (TYLENOL) 325 MG tablet Take 650 mg by mouth every 8 (eight) hours as needed.   Yes Historical Provider, MD  atorvastatin (LIPITOR) 20 MG tablet Take 20 mg by mouth at bedtime.   Yes Historical  Provider, MD  bisacodyl (DULCOLAX) 5 MG EC tablet Take 10 mg by mouth daily.   Yes Historical Provider, MD  carbamide peroxide (DEBROX) 6.5 % otic solution Place 10 drops into both ears 2 (two) times daily as needed (cerum impaction).   Yes Historical Provider, MD  gabapentin (NEURONTIN) 300 MG capsule Take 300 mg by mouth 2 (two) times daily.   Yes Historical Provider, MD  ipratropium-albuterol (DUONEB) 0.5-2.5 (3) MG/3ML SOLN Take 3 mLs by nebulization every 4 (four) hours as needed (shortness of breath).   Yes Historical Provider, MD  loratadine (CLARITIN) 10 MG tablet Take 10 mg by mouth daily.   Yes Historical Provider, MD  magnesium oxide (MAG-OX) 400 (241.3 MG) MG tablet Take 2 tablets (800 mg total) by mouth daily. Patient taking differently: Take 400 mg by mouth 3 (three) times daily.  11/07/13  Yes Sinda Du, MD  metFORMIN (GLUCOPHAGE) 500 MG tablet Take by mouth 2 (two) times daily with a meal.   Yes Historical Provider, MD  mirtazapine (REMERON) 15 MG tablet Take 15 mg by mouth at bedtime.   Yes Historical Provider, MD  Multiple Vitamin (MULTIVITAMIN WITH MINERALS) TABS tablet Take 1 tablet by mouth daily.   Yes Historical Provider, MD  nitroGLYCERIN (NITROSTAT) 0.4 MG SL tablet Place 1 tablet (0.4 mg total) under the tongue every 5 (five) minutes as needed for chest pain. 11/07/13  Yes Sinda Du, MD  Omega-3 Fatty Acids (FISH OIL) 1000 MG CAPS Take 1 capsule by mouth daily.   Yes Historical Provider, MD  polyethylene glycol (MIRALAX / GLYCOLAX) packet Take 17 g by mouth daily.   Yes Historical Provider, MD  polyethylene glycol (MIRALAX / GLYCOLAX) packet Take 17 g by mouth 2 (two) times daily as needed for moderate constipation.   Yes Historical Provider, MD  sennosides-docusate sodium (SENOKOT-S) 8.6-50 MG tablet Take 2 tablets by mouth 2 (two) times daily.   Yes Historical Provider, MD  tamsulosin (FLOMAX) 0.4 MG CAPS capsule Take 0.4 mg by mouth.   Yes Historical Provider, MD   terbinafine (LAMISIL) 1 % cream Apply 1 application topically 2 (two) times daily as needed (itching).   Yes Historical Provider, MD  venlafaxine XR (EFFEXOR-XR) 150 MG 24 hr capsule Take 1 capsule (150 mg total) by mouth daily with breakfast. Patient taking differently: Take 150 mg by mouth daily with breakfast. Take with 71m for a total of 2238m1/25/15  Yes EdSinda DuMD  venlafaxine XR (EFFEXOR-XR) 75 MG 24 hr capsule Take 75 mg by mouth daily with breakfast. Take with 1502mor a total of 225m50mYes Historical Provider, MD  vitamin B-12 (CYANOCOBALAMIN) 1000 MCG tablet Take 1,000 mcg by mouth daily.   Yes Historical Provider, MD  ARIPiprazole (ABILIFY) 15 MG tablet Take 0.5 tablets (7.5 mg total) by mouth daily. 11/07/13   EdwaSinda Du  aspirin 81 MG chewable tablet Chew 1 tablet (81 mg total) by mouth daily. 11/07/13   EdwaSinda Du  docusate sodium 100 MG CAPS Take 100 mg by mouth daily. 11/07/13   EdwaSinda Du  insulin aspart (NOVOLOG) 100 UNIT/ML injection Inject 0-9 Units into the skin 3 (three) times daily with meals. 11/07/13   EdwaSinda Du  insulin aspart (NOVOLOG) 100 UNIT/ML injection Inject 0-5 Units into the skin at bedtime. 11/07/13   EdwaSinda Du  pantoprazole (PROTONIX) 40 MG tablet Take 1 tablet (40 mg total) by mouth daily. 11/07/13   EdwaSinda Du     Results for orders placed or performed during the hospital encounter of 07/11/15 (from the past 48 hour(s))  CBC     Status: Abnormal   Collection Time: 07/11/15  9:40 PM  Result Value Ref Range   WBC 10.8 (H) 3.8 - 10.6 K/uL   RBC 4.16 (L) 4.40 - 5.90 MIL/uL   Hemoglobin 12.9 (L) 13.0 - 18.0 g/dL   HCT 39.3 (L) 40.0 - 52.0 %   MCV 94.5 80.0 - 100.0 fL   MCH 31.0 26.0 - 34.0 pg   MCHC 32.8 32.0 - 36.0 g/dL   RDW 15.0 (H) 11.5 - 14.5 %   Platelets 108 (L) 150 - 440 K/uL  Basic metabolic panel     Status: Abnormal   Collection Time: 07/11/15  9:40 PM  Result Value Ref Range   Sodium  133 (L) 135 - 145 mmol/L   Potassium 4.2 3.5 - 5.1 mmol/L   Chloride 97 (L) 101 - 111 mmol/L   CO2 23 22 - 32 mmol/L   Glucose, Bld 146 (H) 65 - 99 mg/dL   BUN 31 (H) 6 - 20 mg/dL   Creatinine, Ser 0.85 0.61 - 1.24 mg/dL   Calcium 9.5 8.9 - 10.3 mg/dL   GFR calc non Af Amer >60 >60 mL/min   GFR calc Af Amer >60 >60 mL/min    Comment: (NOTE) The eGFR has been calculated  using the CKD EPI equation. This calculation has not been validated in all clinical situations. eGFR's persistently <60 mL/min signify possible Chronic Kidney Disease.    Anion gap 13 5 - 15  Troponin I     Status: None   Collection Time: 07/11/15  9:40 PM  Result Value Ref Range   Troponin I <0.03 <0.031 ng/mL    Comment:        NO INDICATION OF MYOCARDIAL INJURY.   Blood gas, arterial     Status: Abnormal   Collection Time: 07/12/15 12:48 AM  Result Value Ref Range   FIO2 1.00    Delivery systems NON-REBREATHER OXYGEN MASK    pH, Arterial 7.43 7.350 - 7.450   pCO2 arterial 37 32.0 - 48.0 mmHg   pO2, Arterial 63 (L) 83.0 - 108.0 mmHg   Bicarbonate 24.6 21.0 - 28.0 mEq/L   Acid-Base Excess 0.5 0.0 - 3.0 mmol/L   O2 Saturation 92.4 %   Patient temperature 37.0    Collection site RIGHT RADIAL    Sample type ARTERIAL DRAW    Allens test (pass/fail) POSITIVE (A) PASS  Urinalysis complete, with microscopic ([facility] only)     Status: Abnormal   Collection Time: 07/12/15  1:35 AM  Result Value Ref Range   Color, Urine YELLOW (A) YELLOW   APPearance CLEAR (A) CLEAR   Glucose, UA NEGATIVE NEGATIVE mg/dL   Bilirubin Urine NEGATIVE NEGATIVE   Ketones, ur NEGATIVE NEGATIVE mg/dL   Specific Gravity, Urine 1.042 (H) 1.005 - 1.030   Hgb urine dipstick NEGATIVE NEGATIVE   pH 5.0 5.0 - 8.0   Protein, ur NEGATIVE NEGATIVE mg/dL   Nitrite NEGATIVE NEGATIVE   Leukocytes, UA TRACE (A) NEGATIVE   RBC / HPF 0-5 0 - 5 RBC/hpf   WBC, UA 0-5 0 - 5 WBC/hpf   Bacteria, UA NONE SEEN NONE SEEN   Squamous Epithelial / LPF 0-5  (A) NONE SEEN   Mucous PRESENT    Hyaline Casts, UA PRESENT    Dg Chest 2 View  07/11/2015   CLINICAL DATA:  Hypoxia  EXAM: CHEST  2 VIEW  COMPARISON:  05/27/2014  FINDINGS: Normal heart size and stable prominent aortic tortuosity. No aneurysm noted on 2012 chest CT. Diffuse atherosclerotic calcification, including the coronary arteries. There is no edema, consolidation, effusion, or pneumothorax.  IMPRESSION: No evidence of acute cardiopulmonary disease.   Electronically Signed   By: Monte Fantasia M.D.   On: 07/11/2015 21:57   Ct Angio Chest Pe W/cm &/or Wo Cm  07/12/2015   CLINICAL DATA:  Hypoxia  EXAM: CT ANGIOGRAPHY CHEST WITH CONTRAST  TECHNIQUE: Multidetector CT imaging of the chest was performed using the standard protocol during bolus administration of intravenous contrast. Multiplanar CT image reconstructions and MIPs were obtained to evaluate the vascular anatomy.  CONTRAST:  155m OMNIPAQUE IOHEXOL 350 MG/ML SOLN  COMPARISON:  12/14/2010  FINDINGS: THORACIC INLET/BODY WALL:  No acute abnormality.  MEDIASTINUM:  No cardiomegaly or pericardial effusion. Dilated proximal aorta measuring 45 mm diameter. At the level of the sinuses of Valsalva, diameter is 52 mm. No evidence of acute aortic disease. Extensive atherosclerosis, including the coronary arteries. No evidence of pulmonary embolism. No lymphadenopathy.  LUNG WINDOWS:  Depending ground-glass opacity attributed to atelectasis. No indication of pneumonia, edema, effusion, or pneumothorax.  UPPER ABDOMEN:  Prominent volume of colonic stool. Pneumobilia, usually postoperative  OSSEOUS:  No acute fracture.  No suspicious lytic or blastic lesions.  Review of the MIP images confirms  the above findings.  IMPRESSION: 1. Negative for pulmonary embolism. 2. Ascending aorta fusiform aneurysm measuring 45 mm. 3. Dependent atelectasis.   Electronically Signed   By: Monte Fantasia M.D.   On: 07/12/2015 00:37    Review of Systems  Constitutional:  Negative for fever and chills.  HENT: Negative for sore throat and tinnitus.   Eyes: Negative for blurred vision and redness.  Respiratory: Positive for shortness of breath. Negative for cough.   Cardiovascular: Negative for chest pain, palpitations, orthopnea and PND.  Gastrointestinal: Negative for nausea, vomiting, abdominal pain and diarrhea.  Genitourinary: Negative for dysuria, urgency and frequency.  Musculoskeletal: Negative for myalgias and joint pain.  Skin: Negative for rash.       No lesions  Neurological: Negative for speech change, focal weakness and weakness.  Endo/Heme/Allergies: Does not bruise/bleed easily.       No temperature intolerance  Psychiatric/Behavioral: Negative for depression and suicidal ideas.    Blood pressure 115/77, pulse 88, temperature 97 F (36.1 C), temperature source Oral, resp. rate 19, height 6' 3" (1.905 m), weight 77.111 kg (170 lb), SpO2 94 %. Physical Exam  Nursing note and vitals reviewed. Constitutional: He is oriented to person, place, and time. He appears well-developed and well-nourished. No distress.  HENT:  Head: Normocephalic and atraumatic.  Mouth/Throat: Oropharynx is clear and moist.  Eyes: Conjunctivae and EOM are normal. Pupils are equal, round, and reactive to light. No scleral icterus.  Neck: Normal range of motion. Neck supple. No JVD present. No tracheal deviation present. No thyromegaly present.  Cardiovascular: Regular rhythm and normal heart sounds.  Tachycardia present.  Exam reveals no gallop and no friction rub.   No murmur heard. Respiratory: Effort normal and breath sounds normal. No respiratory distress.  GI: Soft. Bowel sounds are normal. He exhibits no distension.  Genitourinary:  Deferred  Musculoskeletal: Normal range of motion. He exhibits no edema.  Lymphadenopathy:    He has no cervical adenopathy.  Neurological: He is alert and oriented to person, place, and time. No cranial nerve deficit.  Skin: Skin  is warm and dry. No rash noted. No erythema.  Psychiatric: He has a normal mood and affect. His behavior is normal. Judgment and thought content normal.     Assessment/Plan 81 year old Caucasian male admitted for hypoxia.  1. Hypoxia: currently requiring 10 L/min O2 via non-rebreather mask.  He is comfortable.  No infiltrate or PE seen.  Lung fields have ground glass appearance that is consistent with chronic lung disease.  The patient denies exposures to workplace volatile organic compounds or pulmonary irritants.  Supplemental O2 as needed.  Consider steroids 2. Hyponatremia: replete volume with NS 3. Diabetes Mellitus type 2: hold metformin; cont basal insulin at reduced dose for hospital diet.  Add sliding scale insulin 4. Dementia: cont Abilify and SSRI's  5. BPH: cont Flomax 5. DVT prophylaxis: heparin 6. GI prophylaxis The patient is a DO NOT RESUSCITATE.  Time spent on admission orders and patient care approximately 25 minutes  Harrie Foreman 07/12/2015, 5:39 AM

## 2015-07-12 NOTE — Consult Note (Signed)
Pulmonary Critical Care  Initial Consult Note   [name] ZOX:[id number] DOB: 1933-03-09 DOA: 07/11/2015  Referring physician: Neysa Bonito, MD PCP: Gertha Calkin   Chief Complaint: Shortness of breath  HPI: [name] is a 81 y.o. male with prior history of DM CVA HTN presented to the hospital with shortness of breath. Patient was noted to be significantly hypoxic on presentation. He had been started on oxygen and had to go up to 100% NRB. He did have a negative CTA with no PE and no infiltrates. He has been started on IV steroids nebulizers. Seems to be slowly improving.   Review of Systems:  Constitutional:  No weight loss, night sweats, Fevers, +fatigue.  HEENT:  No headaches, nasal congestion  Cardio-vascular:  No chest pain, Orthopnea, PND GI:  No heartburn, indigestion, abdominal pain, nausea, vomiting  Resp:  +shortness of breath with exertion and at rest. No coughing up of blood Skin:  no rash or lesions.  Musculoskeletal:  No joint pain or swelling.   Remainder ROS performed and is unremarkable other than noted in HPI  Past Medical History  Diagnosis Date  . Diabetes mellitus   . CVA (cerebral infarction)   . Hypertension   . GI bleed   . PTSD (post-traumatic stress disorder)   . Vascular dementia   . MDD (major depressive disorder)     w/ paranoia   Past Surgical History  Procedure Laterality Date  . Total hip arthroplasty Right    Social History:  reports that he has never smoked. He does not have any smokeless tobacco history on file. He reports that he does not drink alcohol or use illicit drugs.  Allergies  Allergen Reactions  . Yellow Jacket Venom Anaphylaxis  . Betadine Prepstick Plus Other (See Comments)    Reaction: unknown  . Contrast Media [Iodinated Diagnostic Agents] Other (See Comments)    Reaction: unknown. Patient states that he does not remember the reaction, but it happened at the [place]  . Cortisone Other (See Comments)   Reaction: unknown  . Penicillins Other (See Comments)    Reaction: unknown  . Sulfa Antibiotics Other (See Comments)    Reaction: unknown    History reviewed. No pertinent family history.  Prior to Admission medications   Medication Sig Start Date End Date Taking? Authorizing Provider  acetaminophen (TYLENOL) 325 MG tablet Take 650 mg by mouth every 8 (eight) hours as needed.   Yes Historical Provider, MD  atorvastatin (LIPITOR) 20 MG tablet Take 20 mg by mouth at bedtime.   Yes Historical Provider, MD  bisacodyl (DULCOLAX) 5 MG EC tablet Take 10 mg by mouth daily.   Yes Historical Provider, MD  carbamide peroxide (DEBROX) 6.5 % otic solution Place 10 drops into both ears 2 (two) times daily as needed (cerum impaction).   Yes Historical Provider, MD  gabapentin (NEURONTIN) 300 MG capsule Take 300 mg by mouth 2 (two) times daily.   Yes Historical Provider, MD  ipratropium-albuterol (DUONEB) 0.5-2.5 (3) MG/3ML SOLN Take 3 mLs by nebulization every 4 (four) hours as needed (shortness of breath).   Yes Historical Provider, MD  loratadine (CLARITIN) 10 MG tablet Take 10 mg by mouth daily.   Yes Historical Provider, MD  magnesium oxide (MAG-OX) 400 (241.3 MG) MG tablet Take 2 tablets (800 mg total) by mouth daily. Patient taking differently: Take 400 mg by mouth 3 (three) times daily.  11/07/13  Yes Kari Baars, MD  metFORMIN (GLUCOPHAGE) 500 MG tablet Take by mouth  2 (two) times daily with a meal.   Yes Historical Provider, MD  mirtazapine (REMERON) 15 MG tablet Take 15 mg by mouth at bedtime.   Yes Historical Provider, MD  Multiple Vitamin (MULTIVITAMIN WITH MINERALS) TABS tablet Take 1 tablet by mouth daily.   Yes Historical Provider, MD  nitroGLYCERIN (NITROSTAT) 0.4 MG SL tablet Place 1 tablet (0.4 mg total) under the tongue every 5 (five) minutes as needed for chest pain. 11/07/13  Yes Kari Baars, MD  Omega-3 Fatty Acids (FISH OIL) 1000 MG CAPS Take 1 capsule by mouth daily.   Yes  Historical Provider, MD  polyethylene glycol (MIRALAX / GLYCOLAX) packet Take 17 g by mouth daily.   Yes Historical Provider, MD  polyethylene glycol (MIRALAX / GLYCOLAX) packet Take 17 g by mouth 2 (two) times daily as needed for moderate constipation.   Yes Historical Provider, MD  sennosides-docusate sodium (SENOKOT-S) 8.6-50 MG tablet Take 2 tablets by mouth 2 (two) times daily.   Yes Historical Provider, MD  tamsulosin (FLOMAX) 0.4 MG CAPS capsule Take 0.4 mg by mouth.   Yes Historical Provider, MD  terbinafine (LAMISIL) 1 % cream Apply 1 application topically 2 (two) times daily as needed (itching).   Yes Historical Provider, MD  venlafaxine XR (EFFEXOR-XR) 150 MG 24 hr capsule Take 1 capsule (150 mg total) by mouth daily with breakfast. Patient taking differently: Take 150 mg by mouth daily with breakfast. Take with  for a total of  11/07/13  Yes Kari Baars, MD  venlafaxine XR (EFFEXOR-XR) 75 MG 24 hr capsule Take 75 mg by mouth daily with breakfast. Take with  for a total of    Yes Historical Provider, MD  vitamin B-12 (CYANOCOBALAMIN) 1000 MCG tablet Take 1,000 mcg by mouth daily.   Yes Historical Provider, MD  ARIPiprazole (ABILIFY) 15 MG tablet Take 0.5 tablets (7.5 mg total) by mouth daily. 11/07/13   Kari Baars, MD  aspirin 81 MG chewable tablet Chew 1 tablet (81 mg total) by mouth daily. 11/07/13   Kari Baars, MD  docusate sodium 100 MG CAPS Take 100 mg by mouth daily. 11/07/13   Kari Baars, MD  insulin aspart (NOVOLOG) 100 UNIT/ML injection Inject 0-9 Units into the skin 3 (three) times daily with meals. 11/07/13   Kari Baars, MD  insulin aspart (NOVOLOG) 100 UNIT/ML injection Inject 0-5 Units into the skin at bedtime. 11/07/13   Kari Baars, MD  pantoprazole (PROTONIX) 40 MG tablet Take 1 tablet (40 mg total) by mouth daily. 11/07/13   Kari Baars, MD   Physical Exam: Filed Vitals:   07/12/15 0800 07/12/15 1104 07/12/15 1110 07/12/15 1300  BP:  109/71  103/62   Pulse: 85  67 72  Temp: 98 F (36.7 C)  98 F (36.7 C)   TempSrc: Oral     Resp:   20 18  Height:   (1.905 m)    Weight:  76.522 kg (168 lb 11.2 oz)    SpO2: 97%  100% 96%    Wt Readings from Last 3 Encounters:  07/12/15 76.522 kg (168 lb 11.2 oz)  11/06/13 89.8 kg (197 lb 15.6 oz)  11/29/11 81.647 kg (180 lb)    General:  Appears calm and comfortable Eyes: PERRL, normal lids, irises & conjunctiva ENT: grossly normal hearing, lips & tongue Neck: no LAD, masses or thyromegaly Cardiovascular: RRR, no m/r/g. No LE edema. Respiratory: CTA bilaterally, no w/r/r. Normal respiratory effort. Abdomen: soft, nontender Skin: no rash or induration seen on  limited exam Musculoskeletal: grossly normal tone BUE/BLE Psychiatric: grossly normal mood and affect Neurologic: grossly non-focal.          Labs on Admission:  Basic Metabolic Panel:  Recent Labs Lab 07/11/15 2140  NA 133*  K 4.2  CL 97*  CO2 23  GLUCOSE 146*  BUN 31*  CREATININE 0.85  CALCIUM 9.5   Liver Function Tests: No results for input(s): AST, ALT, ALKPHOS, BILITOT, PROT, ALBUMIN in the last 168 hours. No results for input(s): LIPASE, AMYLASE in the last 168 hours. No results for input(s): AMMONIA in the last 168 hours. CBC:  Recent Labs Lab 07/11/15 2140  WBC 10.8*  HGB 12.9*  HCT 39.3*  MCV 94.5  PLT 108*   Cardiac Enzymes:  Recent Labs Lab 07/11/15 2140  TROPONINI <0.03    BNP (last 3 results) No results for input(s): BNP in the last 8760 hours.  ProBNP (last 3 results) No results for input(s): PROBNP in the last 8760 hours.  CBG:  Recent Labs Lab 07/12/15 0910 07/12/15 1109 07/12/15 1631  GLUCAP 191* 206* 98    Radiological Exams on Admission: Dg Chest 2 View  07/11/2015   CLINICAL DATA:  Hypoxia  EXAM: CHEST  2 VIEW  COMPARISON:  05/27/2014  FINDINGS: Normal heart size and stable prominent aortic tortuosity. No aneurysm noted on 2012 chest CT. Diffuse  atherosclerotic calcification, including the coronary arteries. There is no edema, consolidation, effusion, or pneumothorax.  IMPRESSION: No evidence of acute cardiopulmonary disease.   Electronically Signed   By: Marnee Spring M.D.   On: 07/11/2015 21:57   Ct Angio Chest Pe W/cm &/or Wo Cm  07/12/2015   CLINICAL DATA:  Hypoxia  EXAM: CT ANGIOGRAPHY CHEST WITH CONTRAST  TECHNIQUE: Multidetector CT imaging of the chest was performed using the standard protocol during bolus administration of intravenous contrast. Multiplanar CT image reconstructions and MIPs were obtained to evaluate the vascular anatomy.  CONTRAST:  OMNIPAQUE IOHEXOL 350 MG/ML SOLN  COMPARISON:  12/14/2010  FINDINGS: THORACIC INLET/BODY WALL:  No acute abnormality.  MEDIASTINUM:  No cardiomegaly or pericardial effusion. Dilated proximal aorta measuring 45 mm diameter. At the level of the sinuses of Valsalva, diameter is 52 mm. No evidence of acute aortic disease. Extensive atherosclerosis, including the coronary arteries. No evidence of pulmonary embolism. No lymphadenopathy.  LUNG WINDOWS:  Depending ground-glass opacity attributed to atelectasis. No indication of pneumonia, edema, effusion, or pneumothorax.  UPPER ABDOMEN:  Prominent volume of colonic stool. Pneumobilia, usually postoperative  OSSEOUS:  No acute fracture.  No suspicious lytic or blastic lesions.  Review of the MIP images confirms the above findings.  IMPRESSION: 1. Negative for pulmonary embolism. 2. Ascending aorta fusiform aneurysm measuring 45 mm. 3. Dependent atelectasis.   Electronically Signed   By: Marnee Spring M.D.   On: 07/12/2015 00:37    EKG: Independently reviewed.  Assessment/Plan Active Problems:   Hypoxia   Pressure ulcer   1. Acute on Chronic respiratory failure with hypoxia Continue with oxygen therapy Agree with steroids Patient will be started on symbicort also and spiriva He is a DNR presently so no intubation CPR etc Prognosis is  poor     I have personally obtained a history, examined the patient, evaluated laboratory and imaging results, formulated the assessment and plan and placed orders.  The Patient requires high complexity decision making for assessment and support.    Yevonne Pax, MD [facility] Pulmonary Critical Care Medicine Sleep Medicine

## 2015-07-12 NOTE — ED Notes (Signed)
Pt noted to be at 85%, upon assessment of the patient, patient had taken off his NRB mask. Pt in NAD, states he doesn't understand why he is here.Pt placed back on 10L NRB, O2 sats up to 95%.

## 2015-07-12 NOTE — ED Notes (Signed)
Admitting MD to bedside at this time.  

## 2015-07-12 NOTE — ED Notes (Signed)
Pt's daughter leaving bedside to go home at this time. Correct phone number verified in Epic. Pt's daughter requests phone call when pt has room assignment. Pt resting quietly on stretcher with non-rebreather in place at 10L O2. No acute distress noted at this time. Will continue to monitor.

## 2015-07-12 NOTE — ED Notes (Signed)
Report given to Megan, RN.

## 2015-07-12 NOTE — Consult Note (Signed)
MEDICATION RELATED CONSULT NOTE - INITIAL   Pharmacy Consult for theophylline Indication: hypoxia  Allergies  Allergen Reactions  . Yellow Jacket Venom Anaphylaxis  . Betadine Prepstick Plus Other (See Comments)    Reaction: unknown  . Contrast Media [Iodinated Diagnostic Agents] Other (See Comments)    Reaction: unknown. Patient states that he does not remember the reaction, but it happened at the [place]  . Cortisone Other (See Comments)    Reaction: unknown  . Penicillins Other (See Comments)    Reaction: unknown  . Sulfa Antibiotics Other (See Comments)    Reaction: unknown    Patient Measurements: Height:  (190.5 cm) Weight: 168 lb 11.2 oz (76.522 kg) IBW/kg (Calculated) : 84.5 Adjusted Body Weight:   Vital Signs: Temp: 97.9 F (36.6 C) (09/28 1937) Temp Source: Oral (09/28 1937) BP: 110/64 mmHg (09/28 1937) Pulse Rate: 73 (09/28 1937) Intake/Output from previous day:   Intake/Output from this shift: Total I/O In: -  Out: 250 [Urine:250]  Labs:  Recent Labs  07/11/15 2140  WBC 10.8*  HGB 12.9*  HCT 39.3*  PLT 108*  CREATININE 0.85   Estimated Creatinine Clearance: 75 mL/min (by C-G formula based on Cr of 0.85).   Microbiology: Recent Results (from the past 720 hour(s))  C difficile quick scan w PCR reflex     Status: None   Collection Time: 07/12/15  8:33 AM  Result Value Ref Range Status   C Diff antigen NEGATIVE NEGATIVE Final   C Diff toxin NEGATIVE NEGATIVE Final   C Diff interpretation Negative for C. difficile  Final  MRSA PCR Screening     Status: None   Collection Time: 07/12/15  8:33 AM  Result Value Ref Range Status   MRSA by PCR NEGATIVE NEGATIVE Final    Comment:        The GeneXpert MRSA Assay (FDA approved for NASAL specimens only), is one component of a comprehensive MRSA colonization surveillance program. It is not intended to diagnose MRSA infection nor to guide or monitor treatment for MRSA infections.     Medical  History: Past Medical History  Diagnosis Date  . Diabetes mellitus   . CVA (cerebral infarction)   . Hypertension   . GI bleed   . PTSD (post-traumatic stress disorder)   . Vascular dementia   . MDD (major depressive disorder)     w/ paranoia    Medications:  Scheduled:  . atorvastatin  20 mg Oral QHS  . bisacodyl  10 mg Oral Daily  . budesonide-formoterol  2 puff Inhalation BID  . gabapentin  300 mg Oral BID  . heparin  5,000 Units Subcutaneous 3 times per day  . [START ON 07/13/2015] Influenza vac split quadrivalent PF  0.5 mL Intramuscular Tomorrow-1000  . insulin aspart  0-5 Units Subcutaneous QHS  . insulin aspart  0-9 Units Subcutaneous TID WC  . [START ON 07/13/2015] levothyroxine  25 mcg Oral BH-q7a  . loratadine  10 mg Oral Daily  . magnesium oxide  800 mg Oral Daily  . methylPREDNISolone (SOLU-MEDROL) injection  60 mg Intravenous Q6H  . mirtazapine  15 mg Oral QHS  . multivitamin with minerals  1 tablet Oral Daily  . omega-3 acid ethyl esters  1 g Oral Daily  . senna-docusate  2 tablet Oral BID  . tamsulosin  0.4 mg Oral QPC supper  . tiotropium  18 mcg Inhalation Daily  . venlafaxine XR  225 mg Oral Q breakfast  . vitamin B-12  1,000 mcg Oral Daily    Assessment: Pt is a 81 year old male who presents with SOB, with out improvements. Pt denies a smoking hx. Pharmacy consulted to dose theophylline by Dr. Welton Flakes  Goal of Therapy:  Serum theophylline 5-15  Plan:  Will give theophylline 12hr (Theodur) initial, 300 mg/day ORALLY divided every 12 hours; after 3 days if tolerated, 400 mg/day ORALLY divided every 12 hours; after 3 more days if tolerated, 600 mg/day ORALLY divided every 12 hours Because of pt age he is at risk for decreased clearance. I will draw a theophylline level 3 days after the first dose increase if tolerated.  Laurene Footman Maccia 07/12/2015,7:44 PM

## 2015-07-12 NOTE — Progress Notes (Signed)
Patient alert and oriented x4. Oriented to room, unit, and call bell. Admission completed. No complaints at this time. Will cont to assess. Skin assessment verified by Shana Chute, RN. Telemetry box verified. [name]

## 2015-07-12 NOTE — ED Notes (Signed)
ABG done. [facility] down. PH-7.43, pCO2-37,pO2-63,sO2-92.3, BE 0.5, HCO3 24.6, patient on NRB mask upon entry, RR stick, allen test passed. RN informed of ABG.

## 2015-07-12 NOTE — Progress Notes (Signed)
[facility] [place] [facility]   PATIENT NAME: [name]    MR#:  [id number]  DATE OF BIRTH:  [birth year]  SUBJECTIVE:  CHIEF COMPLAINT:  Patient's shortness of breath is better, patient is still on  NRB, feels little better  REVIEW OF SYSTEMS:  CONSTITUTIONAL: No fever, fatigue or weakness EARS, NOSE, AND THROAT: No tinnitus or ear pain.  RESPIRATORY: No cough; shortness of breath is better, wheezing or hemoptysis.  CARDIOVASCULAR: No chest pain, orthopnea, edema.  GASTROINTESTINAL: No nausea, vomiting, diarrhea or abdominal pain.  ENDOCRINE: No polyuria, nocturia,  HEMATOLOGY: No anemia, easy bruising or bleeding SKIN: No rash or lesion. MUSCULOSKELETAL: No joint pain or arthritis.   NEUROLOGIC: No tingling, numbness, weakness.  PSYCHIATRY: No anxiety or depression.   DRUG ALLERGIES:   Allergies  Allergen Reactions  . Yellow Jacket Venom Anaphylaxis  . Betadine Prepstick Plus Other (See Comments)    Reaction: unknown  . Contrast Media [Iodinated Diagnostic Agents] Other (See Comments)    Reaction: unknown. Patient states that he does not remember the reaction, but it happened at the [place]  . Cortisone Other (See Comments)    Reaction: unknown  . Penicillins Other (See Comments)    Reaction: unknown  . Sulfa Antibiotics Other (See Comments)    Reaction: unknown    VITALS:  Blood pressure 103/62, pulse 72, temperature 98 F (36.7 C), temperature source Oral, resp. rate 18, height  (1.905 m), weight 76.522 kg (168 lb 11.2 oz), SpO2 96 %.  PHYSICAL EXAMINATION:  GENERAL:  81 y.o.-year-old patient lying in the bed with no acute distress.  EYES: Pupils equal, round, reactive to light and accommodation. No scleral icterus. Extraocular muscles intact.  HEENT: Head atraumatic, normocephalic. Oropharynx and nasopharynx clear.  NECK:  Supple, no jugular venous distention. No thyroid enlargement, no tenderness.  LUNGS: Normal breath sounds  bilaterally, no wheezing, rales,rhonchi or crepitation. No use of accessory muscles of respiration.  CARDIOVASCULAR: S1, S2 normal. No murmurs, rubs, or gallops.  ABDOMEN: Soft, nontender, nondistended. Bowel sounds present. No organomegaly or mass.  EXTREMITIES: No pedal edema, cyanosis, or clubbing.  NEUROLOGIC: Cranial nerves II through XII are intact. Muscle strength 5/5 in all extremities. Sensation intact. Gait not checked.  PSYCHIATRIC: The patient is alert and oriented  SKIN: No obvious rash, lesion, or ulcer.    LABORATORY PANEL:   CBC  Recent Labs Lab 07/11/15 2140  WBC 10.8*  HGB 12.9*  HCT 39.3*  PLT 108*   ------------------------------------------------------------------------------------------------------------------  Chemistries   Recent Labs Lab 07/11/15 2140  NA 133*  K 4.2  CL 97*  CO2 23  GLUCOSE 146*  BUN 31*  CREATININE 0.85  CALCIUM 9.5   ------------------------------------------------------------------------------------------------------------------  Cardiac Enzymes  Recent Labs Lab 07/11/15 2140  TROPONINI <0.03   ------------------------------------------------------------------------------------------------------------------  RADIOLOGY:  Dg Chest 2 View  07/11/2015   CLINICAL DATA:  Hypoxia  EXAM: CHEST  2 VIEW  COMPARISON:  05/27/2014  FINDINGS: Normal heart size and stable prominent aortic tortuosity. No aneurysm noted on 2012 chest CT. Diffuse atherosclerotic calcification, including the coronary arteries. There is no edema, consolidation, effusion, or pneumothorax.  IMPRESSION: No evidence of acute cardiopulmonary disease.   Electronically Signed   By: Marnee Spring M.D.   On: 07/11/2015 21:57   Ct Angio Chest Pe W/cm &/or Wo Cm  07/12/2015   CLINICAL DATA:  Hypoxia  EXAM: CT ANGIOGRAPHY CHEST WITH CONTRAST  TECHNIQUE: Multidetector CT imaging of the chest was performed using the  standard protocol during bolus administration of  intravenous contrast. Multiplanar CT image reconstructions and MIPs were obtained to evaluate the vascular anatomy.  CONTRAST:  OMNIPAQUE IOHEXOL 350 MG/ML SOLN  COMPARISON:  12/14/2010  FINDINGS: THORACIC INLET/BODY WALL:  No acute abnormality.  MEDIASTINUM:  No cardiomegaly or pericardial effusion. Dilated proximal aorta measuring 45 mm diameter. At the level of the sinuses of Valsalva, diameter is 52 mm. No evidence of acute aortic disease. Extensive atherosclerosis, including the coronary arteries. No evidence of pulmonary embolism. No lymphadenopathy.  LUNG WINDOWS:  Depending ground-glass opacity attributed to atelectasis. No indication of pneumonia, edema, effusion, or pneumothorax.  UPPER ABDOMEN:  Prominent volume of colonic stool. Pneumobilia, usually postoperative  OSSEOUS:  No acute fracture.  No suspicious lytic or blastic lesions.  Review of the MIP images confirms the above findings.  IMPRESSION: 1. Negative for pulmonary embolism. 2. Ascending aorta fusiform aneurysm measuring 45 mm. 3. Dependent atelectasis.   Electronically Signed   By: Marnee Spring M.D.   On: 07/12/2015 00:37    EKG:   Orders placed or performed during the hospital encounter of 07/11/15  . ED EKG  . ED EKG  . EKG 12-Lead  . EKG 12-Lead    ASSESSMENT AND PLAN:   81 year old Caucasian male admitted for hypoxia.  1. Hypoxia: Unclear etiology; could be from hyperventilation from anxiety/hypothyroidism  currently requiring  non-rebreather mask. He is comfortable. CT scan. Lung fields have ground glass appearance that is consistent with chronic lung disease. Ruled out pulmonary embolism or infiltrate   Supplemental O2 as needed.  Continue  Steroids Pulmonology consult is placed  2. Hyponatremia: replete volume with NS 3. Diabetes Mellitus type 2: hold metformin; cont basal insulin at reduced dose for hospital diet. Add sliding scale insulin 4. Dementia: cont Abilify and SSRI's  5. BPH: cont  Flomax 6. Elevated TSH-possible hypothyroidism. Will check free T4 and T3, and they are elevated will consider starting the patient on Synthroid from a.m. 7. DVT prophylaxis: heparin 6. GI prophylaxis     All the records are reviewed and case discussed with Care Management/Social Workerr. Management plans discussed with the patient, family and they are in agreement.  CODE STATUS: dnr  TOTAL TIME TAKING CARE OF THIS PATIENT: 35 minutes.   POSSIBLE D/C IN 2-3 DAYS, DEPENDING ON CLINICAL CONDITION.   Ramonita Lab M.D on 07/12/2015 at 2:42 PM  Between 7am to 6pm - Pager - [phone] After 6pm go to [email or web address] - password [email or web address] [facility]  [facility]  Office  [phone]  CC: Primary care physician; Gertha Calkin

## 2015-07-12 NOTE — ED Provider Notes (Signed)
-----------------------------------------   12:46 AM on 07/12/2015 -----------------------------------------  CTA chest interpreted per Dr. Grace Isaac: 1. Negative for pulmonary embolism. 2. Ascending aorta fusiform aneurysm measuring 45 mm. 3. Dependent atelectasis.  Updated family of CT results. Transfer papers have been faxed to the [place]. Patient is sleeping in no acute distress.  ----------------------------------------- 2:35 AM on 07/12/2015 -----------------------------------------  [facility] is still pending review. Family updated.  ----------------------------------------- 3:20 AM on 07/12/2015 -----------------------------------------  Discussed with Dr. Chales Abrahams from the [facility]. They currently do not have telemetry beds. She will check with their house supervisor and call back.  ----------------------------------------- 3:29 AM on 07/12/2015 -----------------------------------------  No telemetry beds at the [facility]. Will admit patient to our facility. Family understanding and agree.  Irean Hong, MD 07/12/15 [phone]

## 2015-07-13 DIAGNOSIS — J9621 Acute and chronic respiratory failure with hypoxia: Secondary | ICD-10-CM | POA: Diagnosis not present

## 2015-07-13 LAB — BASIC METABOLIC PANEL
ANION GAP: 7 (ref 5–15)
BUN: 32 mg/dL — ABNORMAL HIGH (ref 6–20)
CHLORIDE: 104 mmol/L (ref 101–111)
CO2: 27 mmol/L (ref 22–32)
Calcium: 9.2 mg/dL (ref 8.9–10.3)
Creatinine, Ser: 0.72 mg/dL (ref 0.61–1.24)
GFR calc Af Amer: >60 mL/min (ref >60)
GFR calc non Af Amer: >60 mL/min (ref >60)
GLUCOSE: 194 mg/dL — AB (ref 65–99)
POTASSIUM: 4.6 mmol/L (ref 3.5–5.1)
Sodium: 138 mmol/L (ref 135–145)

## 2015-07-13 LAB — CBC
HEMATOCRIT: 36 % — AB (ref 40.0–52.0)
HEMOGLOBIN: 11.8 g/dL — AB (ref 13.0–18.0)
MCH: 30.7 pg (ref 26.0–34.0)
MCHC: 32.9 g/dL (ref 32.0–36.0)
MCV: 93.2 fL (ref 80.0–100.0)
Platelets: 99 10*3/uL — ABNORMAL LOW (ref 150–440)
RBC: 3.86 MIL/uL — AB (ref 4.40–5.90)
RDW: 14.9 % — AB (ref 11.5–14.5)
WBC: 7.5 10*3/uL (ref 3.8–10.6)

## 2015-07-13 LAB — GLUCOSE, CAPILLARY
Glucose-Capillary: 198 mg/dL — ABNORMAL HIGH (ref 65–99)
Glucose-Capillary: 218 mg/dL — ABNORMAL HIGH (ref 65–99)
Glucose-Capillary: 132 mg/dL — ABNORMAL HIGH (ref 65–99)
Glucose-Capillary: 144 mg/dL — ABNORMAL HIGH (ref 65–99)

## 2015-07-13 LAB — T4, FREE: Free T4: 0.96 ng/dL (ref 0.61–1.12)

## 2015-07-13 MED ORDER — METHYLPREDNISOLONE SODIUM SUCC 40 MG IJ SOLR
40.0000 mg | Freq: Two times a day (BID) | INTRAMUSCULAR | Status: DC
  Administered 2015-07-14: 40 mg via INTRAVENOUS
  Filled 2015-07-13: qty 1

## 2015-07-13 NOTE — Progress Notes (Addendum)
[facility] - [place] at [facility]   PATIENT NAME: [name]    MR#:  [id number]  DATE OF BIRTH:  09/06/1933  SUBJECTIVE:  CHIEF COMPLAINT:  Patient's shortness of breath is better, patient is OFF  NRB, on 4 lit of o2 via [place] ,feels much better  REVIEW OF SYSTEMS:  CONSTITUTIONAL: No fever, fatigue or weakness EARS, NOSE, AND THROAT: No tinnitus or ear pain.  RESPIRATORY: No cough; shortness of breath is better, wheezing or hemoptysis.  CARDIOVASCULAR: No chest pain, orthopnea, edema.  GASTROINTESTINAL: No nausea, vomiting, diarrhea or abdominal pain.  ENDOCRINE: No polyuria, nocturia,  HEMATOLOGY: No anemia, easy bruising or bleeding SKIN: No rash or lesion. MUSCULOSKELETAL: No joint pain or arthritis.   NEUROLOGIC: No tingling, numbness, weakness.  PSYCHIATRY: No anxiety or depression.   DRUG ALLERGIES:   Allergies  Allergen Reactions  . Yellow Jacket Venom Anaphylaxis  . Betadine Prepstick Plus Other (See Comments)    Reaction: unknown  . Contrast Media [Iodinated Diagnostic Agents] Other (See Comments)    Reaction: unknown. Patient states that he does not remember the reaction, but it happened at the [place]  . Cortisone Other (See Comments)    Reaction: unknown  . Penicillins Other (See Comments)    Reaction: unknown  . Sulfa Antibiotics Other (See Comments)    Reaction: unknown    VITALS:  Blood pressure 97/62, pulse 75, temperature 97.2 F (36.2 C), temperature source Oral, resp. rate 20, height  (1.905 m), weight 76.885 kg (169 lb 8 oz), SpO2 92 %.  PHYSICAL EXAMINATION:  GENERAL:  81 y.o.-year-old patient lying in the bed with no acute distress.  EYES: Pupils equal, round, reactive to light and accommodation. No scleral icterus. Extraocular muscles intact.  HEENT: Head atraumatic, normocephalic. Oropharynx and nasopharynx clear.  NECK:  Supple, no jugular venous distention. No thyroid enlargement, no tenderness.  LUNGS: Normal  breath sounds bilaterally, no wheezing, rales,rhonchi or crepitation. No use of accessory muscles of respiration.  CARDIOVASCULAR: S1, S2 normal. No murmurs, rubs, or gallops.  ABDOMEN: Soft, nontender, nondistended. Bowel sounds present. No organomegaly or mass.  EXTREMITIES: No pedal edema, cyanosis, or clubbing.  NEUROLOGIC: Cranial nerves II through XII are intact. Muscle strength 5/5 in all extremities. Sensation intact. Gait not checked.  PSYCHIATRIC: The patient is alert and oriented  SKIN: No obvious rash, lesion, has lt great toe  ulcer.    LABORATORY PANEL:   CBC  Recent Labs Lab 07/13/15 0359  WBC 7.5  HGB 11.8*  HCT 36.0*  PLT 99*   ------------------------------------------------------------------------------------------------------------------  Chemistries   Recent Labs Lab 07/13/15 0359  NA 138  K 4.6  CL 104  CO2 27  GLUCOSE 194*  BUN 32*  CREATININE 0.72  CALCIUM 9.2   ------------------------------------------------------------------------------------------------------------------  Cardiac Enzymes  Recent Labs Lab 07/11/15 2140  TROPONINI <0.03   ------------------------------------------------------------------------------------------------------------------  RADIOLOGY:  Dg Chest 2 View  07/11/2015   CLINICAL DATA:  Hypoxia  EXAM: CHEST  2 VIEW  COMPARISON:  05/27/2014  FINDINGS: Normal heart size and stable prominent aortic tortuosity. No aneurysm noted on 2012 chest CT. Diffuse atherosclerotic calcification, including the coronary arteries. There is no edema, consolidation, effusion, or pneumothorax.  IMPRESSION: No evidence of acute cardiopulmonary disease.   Electronically Signed   By: Marnee Spring M.D.   On: 07/11/2015 21:57   Ct Angio Chest Pe W/cm &/or Wo Cm  07/12/2015   CLINICAL DATA:  Hypoxia  EXAM: CT ANGIOGRAPHY CHEST WITH CONTRAST  TECHNIQUE:  Multidetector CT imaging of the chest was performed using the standard protocol during  bolus administration of intravenous contrast. Multiplanar CT image reconstructions and MIPs were obtained to evaluate the vascular anatomy.  CONTRAST:  OMNIPAQUE IOHEXOL 350 MG/ML SOLN  COMPARISON:  12/14/2010  FINDINGS: THORACIC INLET/BODY WALL:  No acute abnormality.  MEDIASTINUM:  No cardiomegaly or pericardial effusion. Dilated proximal aorta measuring 45 mm diameter. At the level of the sinuses of Valsalva, diameter is 52 mm. No evidence of acute aortic disease. Extensive atherosclerosis, including the coronary arteries. No evidence of pulmonary embolism. No lymphadenopathy.  LUNG WINDOWS:  Depending ground-glass opacity attributed to atelectasis. No indication of pneumonia, edema, effusion, or pneumothorax.  UPPER ABDOMEN:  Prominent volume of colonic stool. Pneumobilia, usually postoperative  OSSEOUS:  No acute fracture.  No suspicious lytic or blastic lesions.  Review of the MIP images confirms the above findings.  IMPRESSION: 1. Negative for pulmonary embolism. 2. Ascending aorta fusiform aneurysm measuring 45 mm. 3. Dependent atelectasis.   Electronically Signed   By: Marnee Spring M.D.   On: 07/12/2015 00:37    EKG:   Orders placed or performed during the hospital encounter of 07/11/15  . ED EKG  . ED EKG  . EKG 12-Lead  . EKG 12-Lead    ASSESSMENT AND PLAN:   81 year old Caucasian male admitted for hypoxia.  1. Hypoxia: Unclear etiology; could be from hyperventilation from anxiety/hypothyroidism with underlying ILD Taper steroids Off   non-rebreather mask.on 02 via [place]. He is comfortable. CT scan. Lung fields have ground glass appearance that is consistent with chronic lung disease. Ruled out pulmonary embolism or infiltrate   Supplemental O2 as needed. Appreciate Pulmonology recs  2. Hyponatremia: resolved  with NS 3. Diabetes Mellitus type 2: hold metformin; cont basal insulin at reduced dose for hospital diet.on  sliding scale insulin 4. Dementia: cont Abilify and  SSRI's  5. BPH: cont Flomax 6. Elevated TSH-possible hypothyroidism. nml  free T4 and pending T3, and they are elevated will consider starting the patient on Synthroid  7. Lt great toe neuropathic ulcer - wound care 7. DVT prophylaxis: heparin 6. GI prophylaxis     All the records are reviewed and case discussed with Care Management/Social Workerr. Management plans discussed with the patient, family and they are in agreement.  CODE STATUS: dnr  TOTAL TIME TAKING CARE OF THIS PATIENT: 35 minutes.   POSSIBLE D/C IN 1-2  DAYS, DEPENDING ON CLINICAL CONDITION.   Ramonita Lab M.D on 07/13/2015 at 8:47 PM  Between 7am to 6pm - Pager - [phone] After 6pm go to [email or web address] - password [email or web address] [facility]  [place] [facility]  Office  [phone]  CC: Primary care physician; Gertha Calkin

## 2015-07-13 NOTE — Consult Note (Signed)
WOC wound consult note Reason for Consult:Neuropathic ulcer to left great toe.   Wound type:Neuropathic ulcer Pressure Ulcer POA: Yes Measurement: 0.5 cm x 1 cm x 0.1 cm Wound ZOX:WRUEA red Drainage (amount, consistency, odor) None noted Periwound:Intact Dressing procedure/placement/frequency:Cleanse ulcer to left great toe with NS and pat gently dry.  Apply silicone border foam dressing.  Change every 3 days and PRN soilage.  Will not follow at this time.  Please re-consult if needed.  Maple Hudson RN BSN CWON Pager [phone]

## 2015-07-13 NOTE — Progress Notes (Signed)
Inpatient Diabetes Program Recommendations  AACE/ADA: New Consensus Statement on Inpatient Glycemic Control (2015)  Target Ranges:  Prepandial:   less than 140 mg/dL      Peak postprandial:   less than 180 mg/dL (1-2 hours)      Critically ill patients:  140 - 180 mg/dL   Review of Glycemic Control  Diabetes history: Type 2 Outpatient Diabetes medications: Metformin  bid Current orders for Inpatient glycemic control: Novolog 0-9 units tid with meals, Novolog 0-5 units qhs  Inpatient Diabetes Program Recommendations: Agree with current orders for correction Novolog insulin orders tid and hs.    Susette Racer, RN, BA, MHA, CDE Diabetes Coordinator Inpatient Diabetes Program  [phone] (Team Pager) [phone] [facility] Office) 07/13/2015 9:53 AM

## 2015-07-14 LAB — BASIC METABOLIC PANEL
ANION GAP: 7 (ref 5–15)
BUN: 41 mg/dL — ABNORMAL HIGH (ref 6–20)
CALCIUM: 8.8 mg/dL — AB (ref 8.9–10.3)
CO2: 28 mmol/L (ref 22–32)
Chloride: 103 mmol/L (ref 101–111)
Creatinine, Ser: 0.87 mg/dL (ref 0.61–1.24)
GFR calc Af Amer: >60 mL/min (ref >60)
GFR calc non Af Amer: >60 mL/min (ref >60)
GLUCOSE: 165 mg/dL — AB (ref 65–99)
Potassium: 4.4 mmol/L (ref 3.5–5.1)
Sodium: 138 mmol/L (ref 135–145)

## 2015-07-14 LAB — GLUCOSE, CAPILLARY
Glucose-Capillary: 147 mg/dL — ABNORMAL HIGH (ref 65–99)
Glucose-Capillary: 174 mg/dL — ABNORMAL HIGH (ref 65–99)
Glucose-Capillary: 121 mg/dL — ABNORMAL HIGH (ref 65–99)

## 2015-07-14 LAB — T3: T3, Total: 67 ng/dL — ABNORMAL LOW (ref 71–180)

## 2015-07-14 MED ORDER — TIOTROPIUM BROMIDE MONOHYDRATE 18 MCG IN CAPS: 18.0000 ug | ORAL_CAPSULE | Freq: Every day | RESPIRATORY_TRACT | Status: DC

## 2015-07-14 MED ORDER — PREDNISONE 10 MG (21) PO TBPK: 10.0000 mg | ORAL_TABLET | Freq: Every day | ORAL | Status: DC

## 2015-07-14 MED ORDER — THEOPHYLLINE ER 200 MG PO TB12: 200.0000 mg | ORAL_TABLET | ORAL | Status: DC

## 2015-07-14 MED ORDER — BUDESONIDE-FORMOTEROL FUMARATE 160-4.5 MCG/ACT IN AERO: 2.0000 | INHALATION_SPRAY | Freq: Two times a day (BID) | RESPIRATORY_TRACT | Status: DC

## 2015-07-14 NOTE — Clinical Social Work Note (Signed)
DC summary needed in order for pt to DC to [facility].

## 2015-07-14 NOTE — Discharge Instructions (Signed)
pt needs theophylline levels checked at dr.Khans office in  a week   continue oxygen 3 L via nasal cannula Activity as tolerated as per physical therapy recommendations Diet low-salt, diabetic Follow-up with Dr. Dagoberto Reef  pulmonology in a week Follow up with Primary care physician in a week

## 2015-07-14 NOTE — Progress Notes (Signed)
Pt is a&o with periods of confusion, VSS, NSR with a 1st degree HB on tele and no complaints of pain or discomfort. Order to D/C pt back to [facility]. Packet completed by SW and report called to [place]ride at facility. Iv and tele removed and pt awaiting EMS.

## 2015-07-14 NOTE — Clinical Social Work Note (Signed)
DC summary received.  CSW notified RN, pt, pt's daughter and facility that pt would DC today back to [facility] via EMS.  CSW signing off .

## 2015-07-14 NOTE — Care Management Important Message (Signed)
Important Message  Patient Details  Name: SAEL FURCHES MRN: [id number] Date of Birth: 06-Jan-1933   Medicare Important Message Given:  Yes-second notification given    Olegario Messier A Allmond 07/14/2015, 10:06 AM

## 2015-07-14 NOTE — Discharge Summary (Signed)
[facility] Phys[facility] - [place] at [facility]   PATIENT NAME: [name]    MR#:  [id number]  DATE OF BIRTH:  May 23, 1933  DATE OF ADMISSION:  07/11/2015 ADMITTING PHYSICIAN: Arnaldo Natal, MD  DATE OF DISCHARGE: 07/14/2015  PRIMARY CARE PHYSICIAN: ISBEY,SUSAN    ADMISSION DIAGNOSIS:  Atelectasis [J98.11] Hypoxia [R09.02]  DISCHARGE DIAGNOSIS:   Hypoxia secondary to COPD exacerbation, chronic interstitial lung disease, hyperventilation from anxiety  SECONDARY DIAGNOSIS:   Past Medical History  Diagnosis Date  . Diabetes mellitus   . CVA (cerebral infarction)   . Hypertension   . GI bleed   . PTSD (post-traumatic stress disorder)   . Vascular dementia   . MDD (major depressive disorder)     w/ paranoia    HOSPITAL COURSE:   81 year old Caucasian male admitted for hypoxia.  1. Hypoxia: secondary to COPD exacerbation, chronic interstitial lung disease, hyperventilation from anxiety  Clinically improved with the IV steroids and theophylline  Taper steroids to prednisone taper pack Off non-rebreather mask.Continue home oxygen 3 L via nasal cannula continuous CT scan. Lung fields have ground glass appearance that is consistent with chronic lung disease. Ruled out pulmonary embolism or infiltrate  Appreciate Pulmonology recs, outpatient follow-up with Dr. Welton Flakes in a week is recommended Patient needs theophylline levels checked in a week at Dr. Milta Deiters office  2. Hyponatremia: resolved with NS 3. Diabetes Mellitus type 2: Resume metformin; continue diabetic diet,on sliding scale insulin 4. Dementia: cont Abilify and SSRI's  5. BPH: cont Flomax 6. Elevated TSH-possible hypothyroidism. nml free T4 and pending T3, which can be acute phase reactant. Will consider repeating thyroid function tests in 6 weeks  7. Lt great toe neuropathic ulcer - wound care 7. DVT prophylaxis: Provided with heparin   DISCHARGE CONDITIONS:    Satisfactory  CONSULTS OBTAINED:  Treatment Team:  Yevonne Pax, MD   PROCEDURES none  DRUG ALLERGIES:   Allergies  Allergen Reactions  . Yellow Jacket Venom Anaphylaxis  . Betadine Prepstick Plus Other (See Comments)    Reaction: unknown  . Contrast Media [Iodinated Diagnostic Agents] Other (See Comments)    Reaction: unknown. Patient states that he does not remember the reaction, but it happened at the [place]  . Cortisone Other (See Comments)    Reaction: unknown  . Penicillins Other (See Comments)    Reaction: unknown  . Sulfa Antibiotics Other (See Comments)    Reaction: unknown    DISCHARGE MEDICATIONS:   Current Discharge Medication List    START taking these medications   Details  budesonide-formoterol (SYMBICORT) 160-4.5 MCG/ACT inhaler Inhale 2 puffs into the lungs 2 (two) times daily. Qty: 1 Inhaler, Refills: 12    predniSONE (STERAPRED UNI-PAK 21 TAB) 10 MG (21) TBPK tablet Take 1 tablet (10 mg total) by mouth daily. Take 6 tablets by mouth for 1 day followed by  5 tablets by mouth for 1 day followed by  4 tablets by mouth for 1 day followed by  3 tablets by mouth for 1 day followed by  2 tablets by mouth for 1 day followed by  1 tablet by mouth for a day and stop Qty: 21 tablet, Refills: 0    theophylline (THEODUR) 200 MG 12 hr tablet Take 1 tablet (200 mg total) by mouth every morning. Qty: 30 tablet, Refills: 0    tiotropium (SPIRIVA) 18 MCG inhalation capsule Place 1 capsule (18 mcg total) into inhaler and inhale daily. Qty: 30 capsule, Refills: 12  CONTINUE these medications which have NOT CHANGED   Details  acetaminophen (TYLENOL) 325 MG tablet Take 650 mg by mouth every 8 (eight) hours as needed.    atorvastatin (LIPITOR) 20 MG tablet Take 20 mg by mouth at bedtime.    bisacodyl (DULCOLAX) 5 MG EC tablet Take 10 mg by mouth daily.    carbamide peroxide (DEBROX) 6.5 % otic solution Place 10 drops into both ears 2 (two) times daily as  needed (cerum impaction).    gabapentin (NEURONTIN) 300 MG capsule Take 300 mg by mouth 2 (two) times daily.    ipratropium-albuterol (DUONEB) 0.5-2.5 (3) MG/3ML SOLN Take 3 mLs by nebulization every 4 (four) hours as needed (shortness of breath).    loratadine (CLARITIN) 10 MG tablet Take 10 mg by mouth daily.    magnesium oxide (MAG-OX) 400 (241.3 MG) MG tablet Take 2 tablets (800 mg total) by mouth daily.    metFORMIN (GLUCOPHAGE) 500 MG tablet Take by mouth 2 (two) times daily with a meal.    mirtazapine (REMERON) 15 MG tablet Take 15 mg by mouth at bedtime.    Multiple Vitamin (MULTIVITAMIN WITH MINERALS) TABS tablet Take 1 tablet by mouth daily.    nitroGLYCERIN (NITROSTAT) 0.4 MG SL tablet Place 1 tablet (0.4 mg total) under the tongue every 5 (five) minutes as needed for chest pain. Refills: 12    Omega-3 Fatty Acids (FISH OIL) 1000 MG CAPS Take 1 capsule by mouth daily.    !! polyethylene glycol (MIRALAX / GLYCOLAX) packet Take 17 g by mouth daily.    !! polyethylene glycol (MIRALAX / GLYCOLAX) packet Take 17 g by mouth 2 (two) times daily as needed for moderate constipation.    sennosides-docusate sodium (SENOKOT-S) 8.6-50 MG tablet Take 2 tablets by mouth 2 (two) times daily.    tamsulosin (FLOMAX) 0.4 MG CAPS capsule Take 0.4 mg by mouth.    terbinafine (LAMISIL) 1 % cream Apply 1 application topically 2 (two) times daily as needed (itching).    !! venlafaxine XR (EFFEXOR-XR) 150 MG 24 hr capsule Take 1 capsule (150 mg total) by mouth daily with breakfast.    !! venlafaxine XR (EFFEXOR-XR) 75 MG 24 hr capsule Take 75 mg by mouth daily with breakfast. Take with 150mg  for a total of 225mg     vitamin B-12 (CYANOCOBALAMIN) 1000 MCG tablet Take 1,000 mcg by mouth daily.    ARIPiprazole (ABILIFY) 15 MG tablet Take 0.5 tablets (7.5 mg total) by mouth daily.    aspirin 81 MG chewable tablet Chew 1 tablet (81 mg total) by mouth daily.    docusate sodium 100 MG CAPS Take  100 mg by mouth daily. Qty: 10 capsule, Refills: 0    !! insulin aspart (NOVOLOG) 100 UNIT/ML injection Inject 0-9 Units into the skin 3 (three) times daily with meals. Qty: 10 mL, Refills: 11    !! insulin aspart (NOVOLOG) 100 UNIT/ML injection Inject 0-5 Units into the skin at bedtime. Qty: 10 mL, Refills: 11    pantoprazole (PROTONIX) 40 MG tablet Take 1 tablet (40 mg total) by mouth daily.     !! - Potential duplicate medications found. Please discuss with provider.       DISCHARGE INSTRUCTIONS:   pt needs theophylline levels checked at dr.Khans office in  a week   continue oxygen 3 L via nasal cannula Activity as tolerated as per physical therapy recommendations Diet low-salt, diabetic Follow-up with Dr. Dagoberto Reef  pulmonology in a week Follow up with Primary care  physician in a week    DIET:  Diabetic diet  DISCHARGE CONDITION:  Fair  ACTIVITY:  Activity as tolerated  OXYGEN:  Home Oxygen: Yes.     Oxygen Delivery: 3 lit % O2 via Patient connected to nasal cannula oxygen  DISCHARGE LOCATION:  nursing home  If you experience worsening of your admission symptoms, develop shortness of breath, life threatening emergency, suicidal or homicidal thoughts you must seek medical attention immediately by calling 911 or calling your MD immediately  if symptoms less severe.  You Must read complete instructions/literature along with all the possible adverse reactions/side effects for all the Medicines you take and that have been prescribed to you. Take any new Medicines after you have completely understood and accpet all the possible adverse reactions/side effects.   Please note  You were cared for by a hospitalist during your hospital stay. If you have any questions about your discharge medications or the care you received while you were in the hospital after you are discharged, you can call the unit and asked to speak with the hospitalist on call if the hospitalist that took  care of you is not available. Once you are discharged, your primary care physician will handle any further medical issues. Please note that NO REFILLS for any discharge medications will be authorized once you are discharged, as it is imperative that you return to your primary care physician (or establish a relationship with a primary care physician if you do not have one) for your aftercare needs so that they can reassess your need for medications and monitor your lab values.     Today  Chief Complaint  Patient presents with  . Shortness of Breath   Patient is resting comfortably. Denies any chest pain or shortness of breath. No complaints   ROS:  CONSTITUTIONAL: Denies fevers, chills. Denies any fatigue, weakness.  EYES: Denies blurry vision, double vision, eye pain. EARS, NOSE, THROAT: Denies tinnitus, ear pain, hearing loss. RESPIRATORY: Denies cough, wheeze, shortness of breath.  CARDIOVASCULAR: Denies chest pain, palpitations, edema.  GASTROINTESTINAL: Denies nausea, vomiting, diarrhea, abdominal pain. Denies bright red blood per rectum. GENITOURINARY: Denies dysuria, hematuria. ENDOCRINE: Denies nocturia or thyroid problems. HEMATOLOGIC AND LYMPHATIC: Denies easy bruising or bleeding. SKIN: Denies rash or lesion. MUSCULOSKELETAL: Denies pain in neck, back, shoulder, knees, hips or arthritic symptoms.  NEUROLOGIC: Denies paralysis, paresthesias.  PSYCHIATRIC: Denies anxiety or depressive symptoms.   VITAL SIGNS:  Blood pressure 96/57, pulse 61, temperature 97.9 F (36.6 C), temperature source Oral, resp. rate 18, height  (1.905 m), weight 75.978 kg (167 lb 8 oz), SpO2 100 %.  I/O:   Intake/Output Summary (Last 24 hours) at 07/14/15 1348 Last data filed at 07/14/15 0830  Gross per 24 hour  Intake    600 ml  Output   1100 ml  Net   -500 ml    PHYSICAL EXAMINATION:  GENERAL:  81 y.o.-year-old patient lying in the bed with no acute distress.  EYES: Pupils equal,  round, reactive to light and accommodation. No scleral icterus. Extraocular muscles intact.  HEENT: Head atraumatic, normocephalic. Oropharynx and nasopharynx clear.  NECK:  Supple, no jugular venous distention. No thyroid enlargement, no tenderness.  LUNGS: Normal breath sounds bilaterally, no wheezing, rales,rhonchi or crepitation. No use of accessory muscles of respiration.  CARDIOVASCULAR: S1, S2 normal. No murmurs, rubs, or gallops.  ABDOMEN: Soft, non-tender, non-distended. Bowel sounds present. No organomegaly or mass.  EXTREMITIES: No pedal edema, cyanosis, or clubbing.  NEUROLOGIC: Cranial  nerves II through XII are intact. Muscle strength . Sensation intact. Ga at his baseline for his ageit not checked.  PSYCHIATRIC: The patient is alert and oriented x 3.  SKIN: No obvious rash, lesion, or ulcer.   DATA REVIEW:   CBC  Recent Labs Lab 07/13/15 0359  WBC 7.5  HGB 11.8*  HCT 36.0*  PLT 99*    Chemistries   Recent Labs Lab 07/14/15 0515  NA 138  K 4.4  CL 103  CO2 28  GLUCOSE 165*  BUN 41*  CREATININE 0.87  CALCIUM 8.8*    Cardiac Enzymes  Recent Labs Lab 07/11/15 2140  TROPONINI <0.03    Microbiology Results  Results for orders placed or performed during the hospital encounter of 07/11/15  C difficile quick scan w PCR reflex     Status: None   Collection Time: 07/12/15  8:33 AM  Result Value Ref Range Status   C Diff antigen NEGATIVE NEGATIVE Final   C Diff toxin NEGATIVE NEGATIVE Final   C Diff interpretation Negative for C. difficile  Final  MRSA PCR Screening     Status: None   Collection Time: 07/12/15  8:33 AM  Result Value Ref Range Status   MRSA by PCR NEGATIVE NEGATIVE Final    Comment:        The GeneXpert MRSA Assay (FDA approved for NASAL specimens only), is one component of a comprehensive MRSA colonization surveillance program. It is not intended to diagnose MRSA infection nor to guide or monitor treatment for MRSA infections.      RADIOLOGY:  Dg Chest 2 View  07/11/2015   CLINICAL DATA:  Hypoxia  EXAM: CHEST  2 VIEW  COMPARISON:  05/27/2014  FINDINGS: Normal heart size and stable prominent aortic tortuosity. No aneurysm noted on 2012 chest CT. Diffuse atherosclerotic calcification, including the coronary arteries. There is no edema, consolidation, effusion, or pneumothorax.  IMPRESSION: No evidence of acute cardiopulmonary disease.   Electronically Signed   By: Marnee Spring M.D.   On: 07/11/2015 21:57   Ct Angio Chest Pe W/cm &/or Wo Cm  07/12/2015   CLINICAL DATA:  Hypoxia  EXAM: CT ANGIOGRAPHY CHEST WITH CONTRAST  TECHNIQUE: Multidetector CT imaging of the chest was performed using the standard protocol during bolus administration of intravenous contrast. Multiplanar CT image reconstructions and MIPs were obtained to evaluate the vascular anatomy.  CONTRAST:  OMNIPAQUE IOHEXOL 350 MG/ML SOLN  COMPARISON:  12/14/2010  FINDINGS: THORACIC INLET/BODY WALL:  No acute abnormality.  MEDIASTINUM:  No cardiomegaly or pericardial effusion. Dilated proximal aorta measuring 45 mm diameter. At the level of the sinuses of Valsalva, diameter is 52 mm. No evidence of acute aortic disease. Extensive atherosclerosis, including the coronary arteries. No evidence of pulmonary embolism. No lymphadenopathy.  LUNG WINDOWS:  Depending ground-glass opacity attributed to atelectasis. No indication of pneumonia, edema, effusion, or pneumothorax.  UPPER ABDOMEN:  Prominent volume of colonic stool. Pneumobilia, usually postoperative  OSSEOUS:  No acute fracture.  No suspicious lytic or blastic lesions.  Review of the MIP images confirms the above findings.  IMPRESSION: 1. Negative for pulmonary embolism. 2. Ascending aorta fusiform aneurysm measuring 45 mm. 3. Dependent atelectasis.   Electronically Signed   By: Marnee Spring M.D.   On: 07/12/2015 00:37    EKG:   Orders placed or performed during the hospital encounter of 07/11/15  . ED EKG   . ED EKG  . EKG 12-Lead  . EKG 12-Lead  Management plans discussed with the patient, family and they are in agreement.  CODE STATUS:     Code Status Orders        Start     Ordered   07/12/15 [phone]  Do not attempt resuscitation (DNR)   Continuous    Question Answer Comment  In the event of cardiac or respiratory ARREST Do not call a "code blue"   In the event of cardiac or respiratory ARREST Do not perform Intubation, CPR, defibrillation or ACLS   In the event of cardiac or respiratory ARREST Use medication by any route, position, wound care, and other measures to relive pain and suffering. May use oxygen, suction and manual treatment of airway obstruction as needed for comfort.      07/12/15 0645      TOTAL TIME TAKING CARE OF THIS PATIENT: 45  minutes.    @  on 07/14/2015 at 1:48 PM  Between 7am to 6pm - Pager - [phone]  After 6pm go to [email or web address] - password [email or web address] [facility]  [place]  [facility]  Office  [phone]  CC: Primary care physician; Gertha Calkin

## 2015-07-14 NOTE — Evaluation (Signed)
Physical Therapy Evaluation Patient Details Name: [name] MRN: [id number] DOB: 10/09/1933 Today's Date: 07/14/2015   History of Present Illness  Pt here with hypoxia, now on 3 liters with 98% saturations  Clinical Impression  Pt is very limited and apparently has not been able to walk in ~2 years.  He is in bed or a w/c most of the time and needs a lift to transfer.  He reports he is eager to try to work with PT and try to get back to walking but unsure how likely this is given his weakness, stiffness and the time it's been since he has walked.    Follow Up Recommendations SNF (pt eager to try and get back to walking, has been 2 years...)    Equipment Recommendations       Recommendations for Other Services       Precautions / Restrictions Precautions Precautions: Fall Restrictions Weight Bearing Restrictions: No      Mobility  Bed Mobility Overal bed mobility: Needs Assistance Bed Mobility: Supine to Sit     Supine to sit: Mod assist     General bed mobility comments: reliant on the rails, good effort  Transfers Overall transfer level: Needs assistance Equipment used: Rolling walker (2 wheeled) Transfers: Sit to/from Stand Sit to Stand: Total assist (pt unable to get to upright, heavy PT assist)            Ambulation/Gait Ambulation/Gait assistance:  (unable)              Stairs            Wheelchair Mobility    Modified Rankin (Stroke Patients Only)       Balance                                             Pertinent Vitals/Pain Pain Assessment: No/denies pain    Home Living Family/patient expects to be discharged to:: Skilled nursing facility                      Prior Function Level of Independence: Needs assistance         Comments: apparently pt has no walked in 2 years since hip fx, needs lift to get to w/c      Hand Dominance        Extremity/Trunk Assessment   Upper Extremity  Assessment: Generalized weakness (pt able to lift b/l UEs overhead, grossly 4/5)           Lower Extremity Assessment: Generalized weakness (~3+/5 t/o, R ankle inversion contrature, general stiffness )         Communication   Communication: No difficulties  Cognition Arousal/Alertness: Awake/alert Behavior During Therapy: WFL for tasks assessed/performed Overall Cognitive Status: Within Functional Limits for tasks assessed                      General Comments      Exercises        Assessment/Plan    PT Assessment Patient needs continued PT services  PT Diagnosis Generalized weakness   PT Problem List Decreased strength;Decreased activity tolerance;Decreased range of motion;Decreased balance;Decreased mobility;Decreased safety awareness  PT Treatment Interventions DME instruction;Gait training;Functional mobility training;Therapeutic activities;Therapeutic exercise;Balance training   PT Goals (Current goals can be found in the Care Plan section) Acute Rehab PT Goals Patient  Stated Goal: "I want to work with PT and get to walking again." PT Goal Formulation: With patient Time For Goal Achievement: 07/28/15 Potential to Achieve Goals: Fair    Frequency Min 2X/week   Barriers to discharge        Co-evaluation               End of Session Equipment Utilized During Treatment: Gait belt Activity Tolerance: Patient limited by fatigue Patient left: with bed alarm set           Time: 1155-1210 PT Time Calculation (min) (ACUTE ONLY): 15 min   Charges:   PT Evaluation $Initial PT Evaluation Tier I: 1 Procedure     PT G Codes:       Loran Senters, PT, DPT [phone]  Malachi Pro 07/14/2015, 3:05 PM

## 2015-08-05 ENCOUNTER — Other Ambulatory Visit
Admission: RE | Admit: 2015-08-05 | Discharge: 2015-08-05 | Disposition: A | Discharge status: Home / Self Care | Payer: Medicare Other | Source: Skilled Nursing Facility | Attending: Family Medicine | Admitting: Family Medicine

## 2015-08-05 DIAGNOSIS — D649 Anemia, unspecified: Secondary | ICD-10-CM | POA: Diagnosis not present

## 2015-08-05 DIAGNOSIS — R8290 Unspecified abnormal findings in urine: Secondary | ICD-10-CM | POA: Diagnosis not present

## 2015-08-05 DIAGNOSIS — I2699 Other pulmonary embolism without acute cor pulmonale: Secondary | ICD-10-CM | POA: Insufficient documentation

## 2015-08-05 LAB — URINALYSIS COMPLETE WITH MICROSCOPIC (ARMC ONLY)
BILIRUBIN URINE: NEGATIVE
Bacteria, UA: NONE SEEN
GLUCOSE, UA: NEGATIVE mg/dL
HGB URINE DIPSTICK: NEGATIVE
Ketones, ur: NEGATIVE mg/dL
LEUKOCYTES UA: NEGATIVE
NITRITE: NEGATIVE
PH: 6 (ref 5.0–8.0)
Protein, ur: NEGATIVE mg/dL
SPECIFIC GRAVITY, URINE: 1.017 (ref 1.005–1.030)
Squamous Epithelial / LPF: NONE SEEN

## 2015-08-06 LAB — URINE CULTURE

## 2015-09-20 NOTE — Care Management (Signed)
Post discharge: Received call from [name](Larry) requesting D/c Summary which has been faxed and ABD xrays which were not available therefore was not faxed.

## 2015-11-29 ENCOUNTER — Other Ambulatory Visit
Admission: RE | Admit: 2015-11-29 | Discharge: 2015-11-29 | Disposition: A | Discharge status: Home / Self Care | Payer: Medicare Other | Source: Ambulatory Visit | Attending: Physician Assistant | Admitting: Physician Assistant

## 2015-11-29 DIAGNOSIS — R197 Diarrhea, unspecified: Secondary | ICD-10-CM | POA: Insufficient documentation

## 2015-12-04 LAB — NOROVIRUS GROUP 1 & 2 BY PCR, STOOL
Norovirus 1 by PCR: NEGATIVE
Norovirus 2  by PCR: NEGATIVE

## 2015-12-21 ENCOUNTER — Other Ambulatory Visit
Admission: RE | Admit: 2015-12-21 | Discharge: 2015-12-21 | Disposition: A | Discharge status: Home / Self Care | Payer: Medicare Other | Source: Ambulatory Visit | Attending: Physician Assistant | Admitting: Physician Assistant

## 2015-12-21 DIAGNOSIS — R339 Retention of urine, unspecified: Secondary | ICD-10-CM | POA: Diagnosis present

## 2015-12-21 LAB — URINALYSIS COMPLETE WITH MICROSCOPIC (ARMC ONLY)
BILIRUBIN URINE: NEGATIVE
Bacteria, UA: NONE SEEN
Glucose, UA: NEGATIVE mg/dL
Hgb urine dipstick: NEGATIVE
KETONES UR: NEGATIVE mg/dL
LEUKOCYTES UA: NEGATIVE
Nitrite: NEGATIVE
PH: 6 (ref 5.0–8.0)
Protein, ur: NEGATIVE mg/dL
Specific Gravity, Urine: 1.017 (ref 1.005–1.030)

## 2015-12-23 LAB — URINE CULTURE: Culture: NO GROWTH

## 2016-03-22 IMAGING — CR DG CHEST 2V
1 series · 3 of 3 positions shown · non-contrast
Comparison: 05/27/2014

CLINICAL DATA: Hypoxia

EXAM:
CHEST  2 VIEW

[Series 1: w chest lat · 0.14mm/px · 3 of 3 slices shown]
[im 1/3]
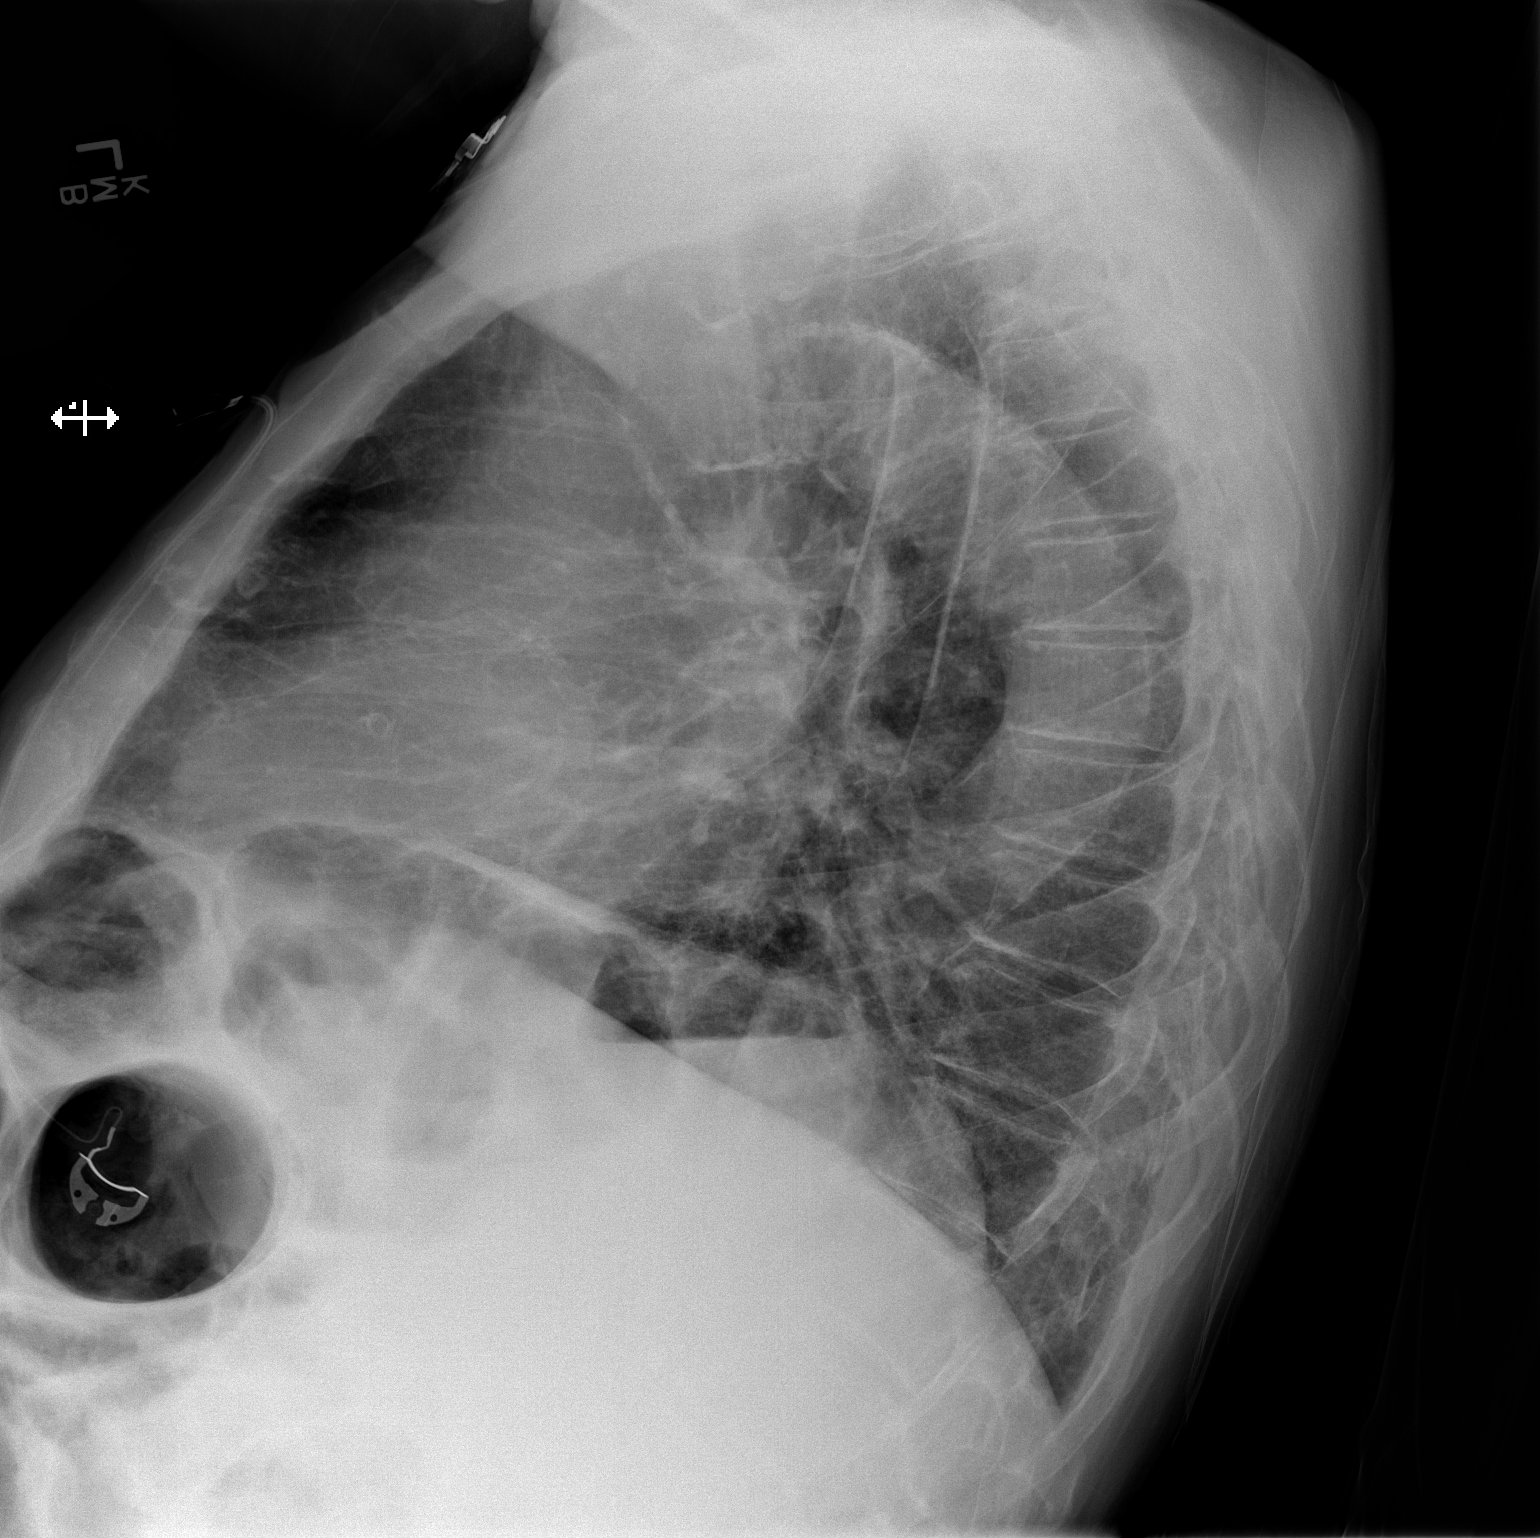
[im 2/3]
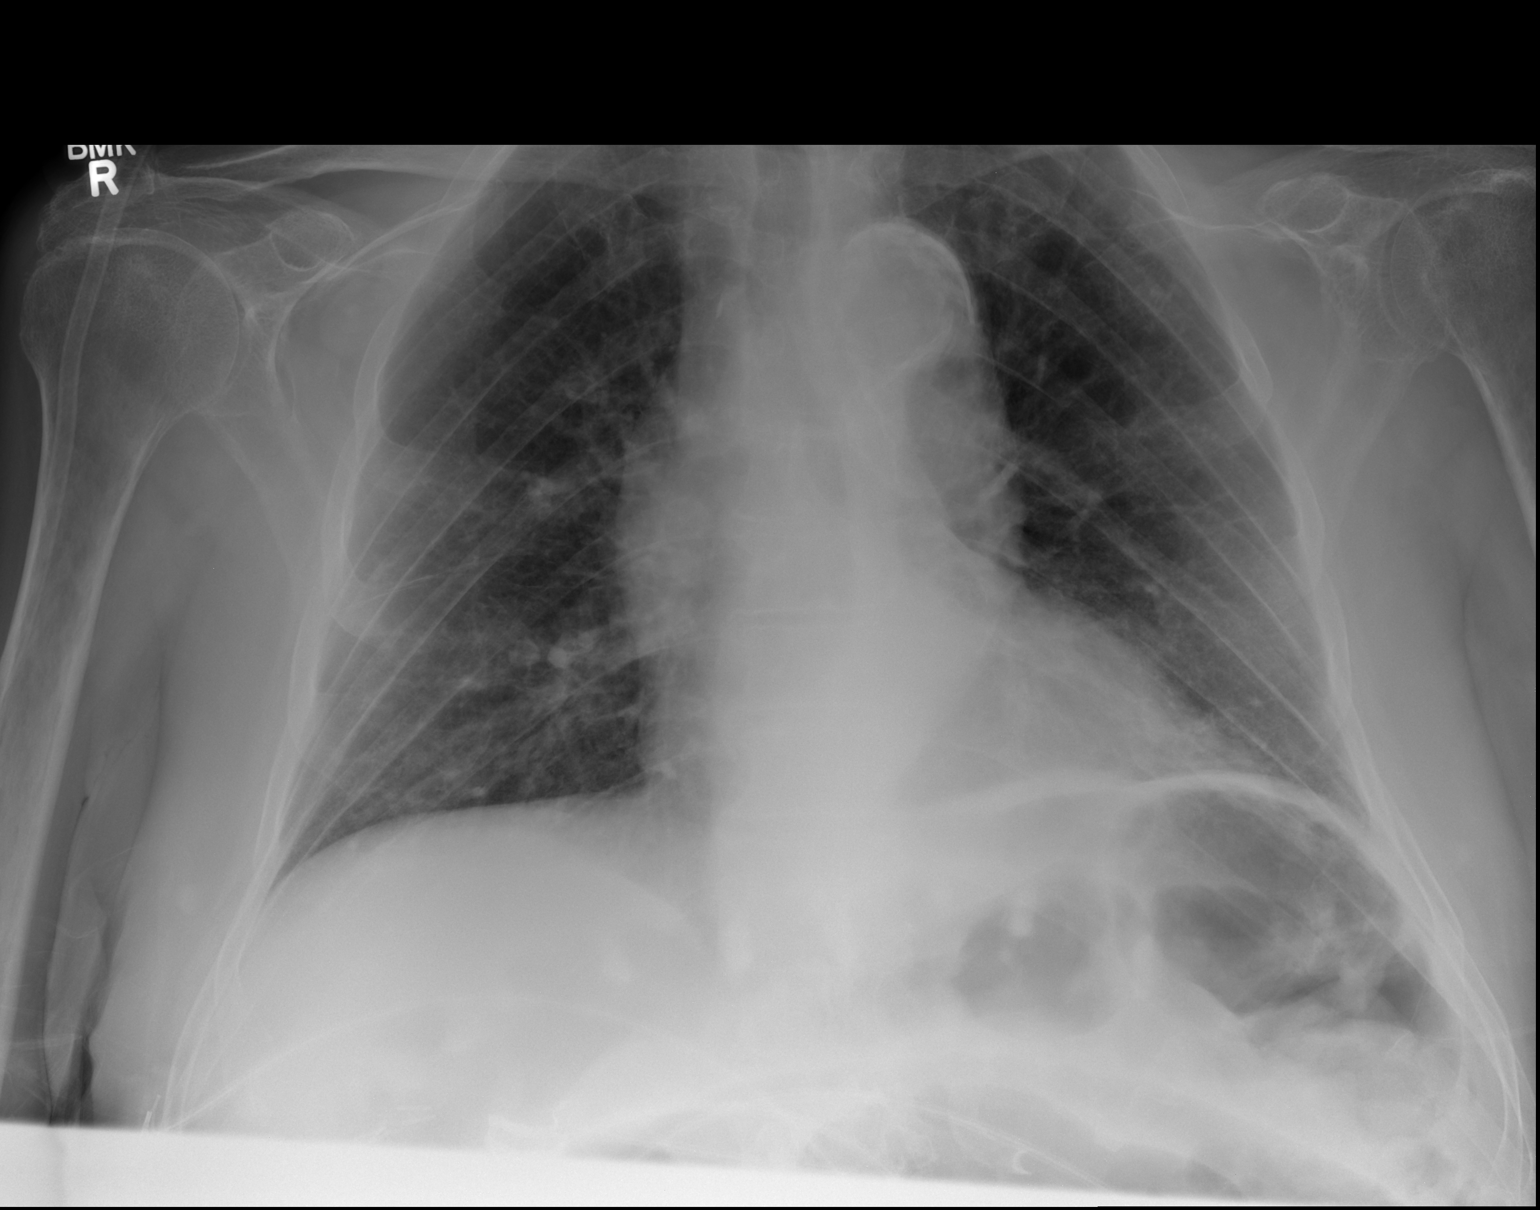
[im 3/3]
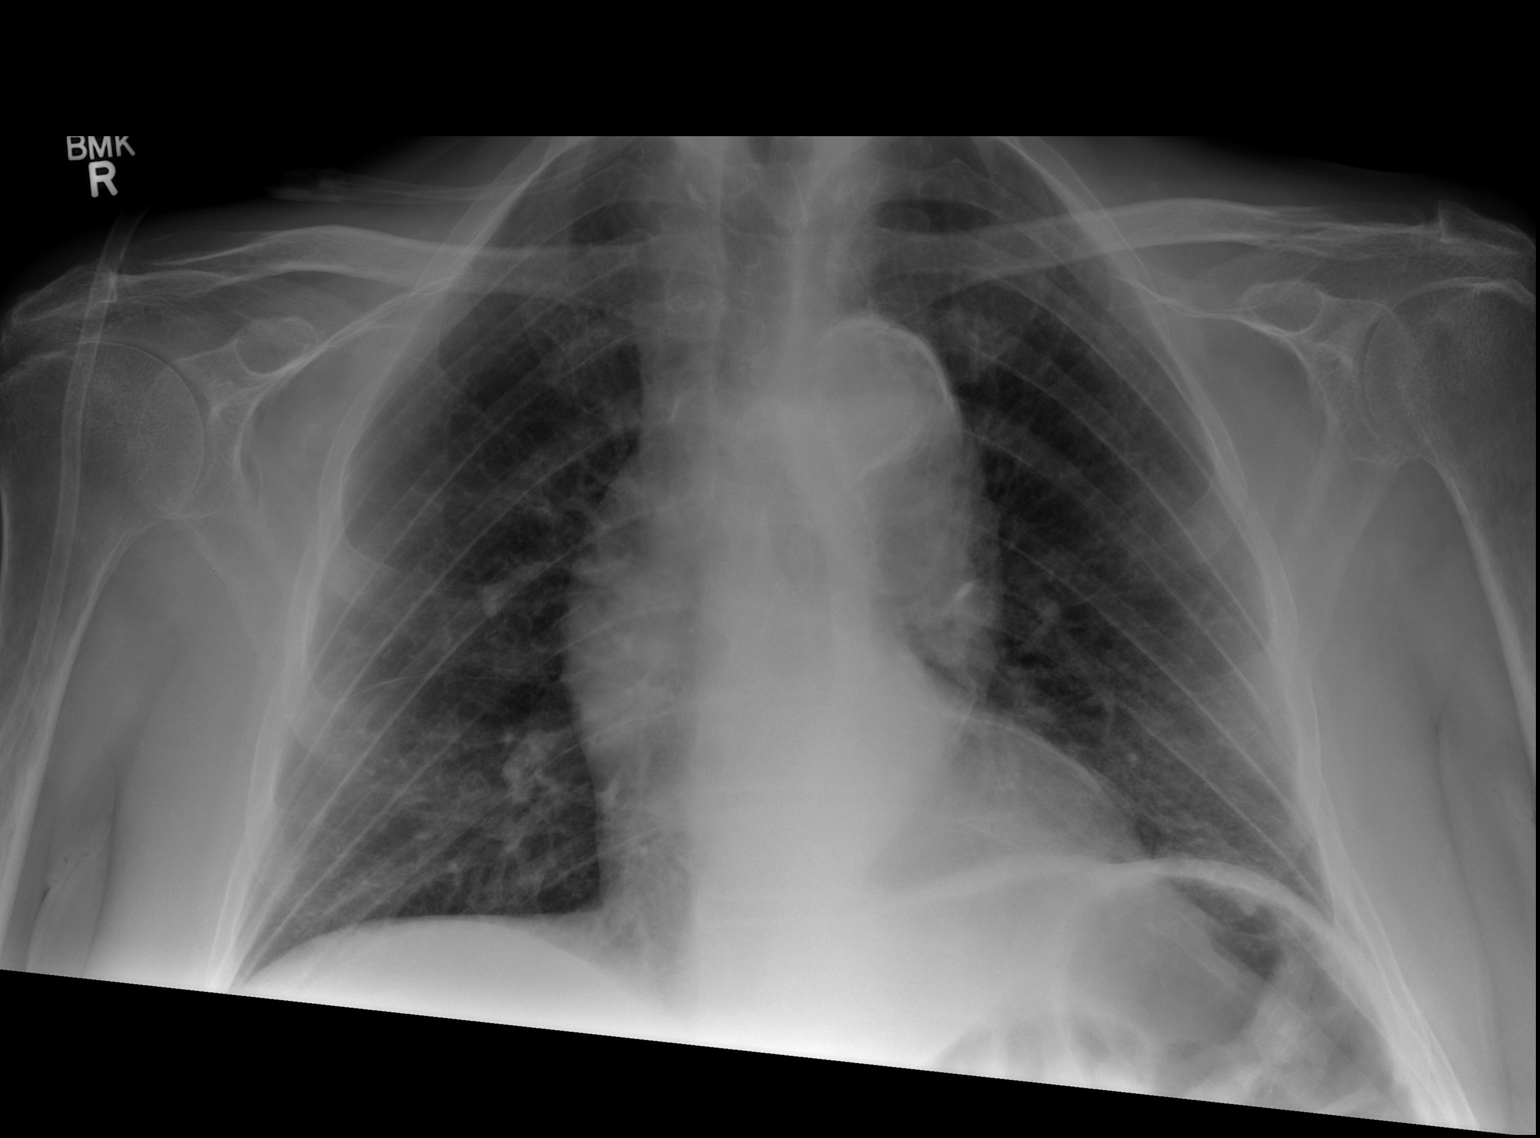

[3 of 3 positions shown; findings below may reference images not displayed]

FINDINGS: Normal heart size and stable prominent aortic tortuosity. No
aneurysm noted on 9899 chest CT. Diffuse atherosclerotic
calcification, including the coronary arteries. There is no edema,
consolidation, effusion, or pneumothorax.
IMPRESSION: No evidence of acute cardiopulmonary disease.

## 2017-09-02 ENCOUNTER — Encounter: Payer: Self-pay | Admitting: Emergency Medicine

## 2017-09-02 ENCOUNTER — Emergency Department: Payer: Non-veteran care

## 2017-09-02 ENCOUNTER — Other Ambulatory Visit: Payer: Self-pay

## 2017-09-02 ENCOUNTER — Emergency Department
Admission: EM | Admit: 2017-09-02 | Discharge: 2017-09-02 | Disposition: A | Discharge status: Home / Self Care | Payer: Non-veteran care | Attending: Student in an Organized Health Care Education/Training Program | Admitting: Student in an Organized Health Care Education/Training Program

## 2017-09-02 DIAGNOSIS — R4182 Altered mental status, unspecified: Secondary | ICD-10-CM

## 2017-09-02 DIAGNOSIS — Z8673 Personal history of transient ischemic attack (TIA), and cerebral infarction without residual deficits: Secondary | ICD-10-CM | POA: Insufficient documentation

## 2017-09-02 DIAGNOSIS — F0150 Vascular dementia without behavioral disturbance: Secondary | ICD-10-CM | POA: Insufficient documentation

## 2017-09-02 DIAGNOSIS — J449 Chronic obstructive pulmonary disease, unspecified: Secondary | ICD-10-CM | POA: Diagnosis not present

## 2017-09-02 DIAGNOSIS — Z79899 Other long term (current) drug therapy: Secondary | ICD-10-CM | POA: Insufficient documentation

## 2017-09-02 DIAGNOSIS — Z7982 Long term (current) use of aspirin: Secondary | ICD-10-CM | POA: Diagnosis not present

## 2017-09-02 DIAGNOSIS — Z794 Long term (current) use of insulin: Secondary | ICD-10-CM | POA: Diagnosis not present

## 2017-09-02 DIAGNOSIS — I10 Essential (primary) hypertension: Secondary | ICD-10-CM | POA: Insufficient documentation

## 2017-09-02 DIAGNOSIS — F0390 Unspecified dementia without behavioral disturbance: Secondary | ICD-10-CM

## 2017-09-02 DIAGNOSIS — E119 Type 2 diabetes mellitus without complications: Secondary | ICD-10-CM | POA: Insufficient documentation

## 2017-09-02 HISTORY — DX: Chronic obstructive pulmonary disease, unspecified: J44.9

## 2017-09-02 LAB — COMPREHENSIVE METABOLIC PANEL
ALT: 9 U/L — ABNORMAL LOW (ref 17–63)
ANION GAP: 11 (ref 5–15)
AST: 24 U/L (ref 15–41)
Albumin: 3.6 g/dL (ref 3.5–5.0)
Alkaline Phosphatase: 70 U/L (ref 38–126)
BUN: 21 mg/dL — ABNORMAL HIGH (ref 6–20)
CALCIUM: 9.8 mg/dL (ref 8.9–10.3)
CHLORIDE: 103 mmol/L (ref 101–111)
CO2: 23 mmol/L (ref 22–32)
CREATININE: 0.98 mg/dL (ref 0.61–1.24)
Glucose, Bld: 121 mg/dL — ABNORMAL HIGH (ref 65–99)
Potassium: 4.7 mmol/L (ref 3.5–5.1)
SODIUM: 137 mmol/L (ref 135–145)
Total Bilirubin: 0.7 mg/dL (ref 0.3–1.2)
Total Protein: 6.8 g/dL (ref 6.5–8.1)

## 2017-09-02 LAB — URINALYSIS, COMPLETE (UACMP) WITH MICROSCOPIC
BACTERIA UA: NONE SEEN
Bilirubin Urine: NEGATIVE
Glucose, UA: NEGATIVE mg/dL
Ketones, ur: NEGATIVE mg/dL
Leukocytes, UA: NEGATIVE
Nitrite: NEGATIVE
PROTEIN: NEGATIVE mg/dL
SPECIFIC GRAVITY, URINE: 1.015 (ref 1.005–1.030)
pH: 6 (ref 5.0–8.0)

## 2017-09-02 LAB — CBC WITH DIFFERENTIAL/PLATELET
Basophils Absolute: 0.1 10*3/uL (ref 0–0.1)
Basophils Relative: 1 %
Eosinophils Absolute: 0.3 10*3/uL (ref 0–0.7)
Eosinophils Relative: 3 %
HEMATOCRIT: 38.3 % — AB (ref 40.0–52.0)
HEMOGLOBIN: 12.5 g/dL — AB (ref 13.0–18.0)
LYMPHS ABS: 4 10*3/uL — AB (ref 1.0–3.6)
LYMPHS PCT: 37 %
MCH: 30.7 pg (ref 26.0–34.0)
MCHC: 32.5 g/dL (ref 32.0–36.0)
MCV: 94.5 fL (ref 80.0–100.0)
MONOS PCT: 8 %
Monocytes Absolute: 0.8 10*3/uL (ref 0.2–1.0)
NEUTROS ABS: 5.4 10*3/uL (ref 1.4–6.5)
NEUTROS PCT: 51 %
Platelets: 136 10*3/uL — ABNORMAL LOW (ref 150–440)
RBC: 4.05 MIL/uL — ABNORMAL LOW (ref 4.40–5.90)
RDW: 18.3 % — ABNORMAL HIGH (ref 11.5–14.5)
WBC: 10.6 10*3/uL (ref 3.8–10.6)

## 2017-09-02 LAB — URINE DRUG SCREEN, QUALITATIVE (ARMC ONLY)
Amphetamines, Ur Screen: NOT DETECTED
Barbiturates, Ur Screen: NOT DETECTED
Benzodiazepine, Ur Scrn: NOT DETECTED
CANNABINOID 50 NG, UR ~~LOC~~: NOT DETECTED
COCAINE METABOLITE, UR ~~LOC~~: NOT DETECTED
MDMA (Ecstasy)Ur Screen: NOT DETECTED
METHADONE SCREEN, URINE: NOT DETECTED
OPIATE, UR SCREEN: NOT DETECTED
PHENCYCLIDINE (PCP) UR S: NOT DETECTED
TRICYCLIC, UR SCREEN: NOT DETECTED

## 2017-09-02 LAB — TROPONIN I

## 2017-09-02 NOTE — ED Triage Notes (Signed)
[facility] c/o AMS since 1130 today.  Per EMS facility states patient normally able to hold a conversation but is responsive to pain upon arrival, also has productive cough.  Hx of dysphagia and stroke.  Given 1mg  en route without change.  Wears 3L [place] at baseline.

## 2017-09-02 NOTE — ED Notes (Signed)
Patient transported to MRI 

## 2017-09-02 NOTE — ED Notes (Signed)
MRI spoke to family for assessment questions.

## 2017-09-02 NOTE — ED Notes (Signed)
Pt sleeping when he returned from MRI.  Patient not easily arousable.  Pt clenched the lower half of his body when catheterized but did not wake up.

## 2017-09-02 NOTE — ED Provider Notes (Signed)
[facility] Emergency Department Provider Note    First MD Initiated Contact with Patient 09/02/17 1327     (approximate)  I have reviewed the triage vital signs and the nursing notes.   HISTORY  Chief Complaint Altered Mental Status  Level V Caveat: acute encephalopathy  HPI [name] is a 83 y.o. male multiple chronic comorbidities presents with altered mental status and waxing and responsiveness.  Patient is a DNR.  Unable to provide much history due to underlying severe vascular dementia.  Spell at bedside state that patient has been having increasing emotional outbursts but has not been confused like this since his previous stroke in 2012 for which he received TPA.  Past Medical History:  Diagnosis Date  . COPD (chronic obstructive pulmonary disease) (HCC)   . CVA (cerebral infarction)   . Diabetes mellitus   . GI bleed   . Hypertension   . MDD (major depressive disorder)    w/ paranoia  . PTSD (post-traumatic stress disorder)   . Vascular dementia    History reviewed. No pertinent family history. Past Surgical History:  Procedure Laterality Date  . TOTAL HIP ARTHROPLASTY Right    Patient Active Problem List   Diagnosis Date Noted  . Hypoxia 07/12/2015  . Pressure ulcer 07/12/2015  . Fall at nursing home 11/06/2013  . Hip fracture, right (HCC) 11/06/2013  . Vascular dementia   . Hypertension   . Muscle weakness (generalized) 06/11/2011  . Difficulty in walking(719.7) 06/11/2011      Prior to Admission medications   Medication Sig Start Date End Date Taking? Authorizing Provider  albuterol (PROVENTIL HFA;VENTOLIN HFA) 108 (90 Base) MCG/ACT inhaler Inhale 1 puff into the lungs every 6 (six) hours as needed for wheezing or shortness of breath.   Yes [provider]  ARIPiprazole (ABILIFY) 15 MG tablet Take 0.5 tablets (7.5 mg total) by mouth daily. 11/07/13  Yes Kari BaarsHawkins, Edward, MD  aspirin 81 MG chewable tablet Chew 1  tablet (81 mg total) by mouth daily. 11/07/13  Yes Kari BaarsHawkins, Edward, MD  atorvastatin (LIPITOR) 40 MG tablet Take 20 mg by mouth at bedtime.   Yes [provider]  carbamide peroxide (DEBROX) 6.5 % otic solution Place 10 drops into both ears 2 (two) times daily as needed (cerum impaction).   Yes [provider]  Emollient (EUCERIN) lotion Apply topically as needed for dry skin.   Yes [provider]  gabapentin (NEURONTIN) 300 MG capsule Take 300 mg by mouth 2 (two) times daily.   Yes [provider]  ketoconazole (NIZORAL) 2 % shampoo Apply 1 application topically 2 (two) times a week.   Yes [provider]  magnesium oxide (MAG-OX) 400 (241.3 MG) MG tablet Take 2 tablets (800 mg total) by mouth daily. 11/07/13  Yes Kari BaarsHawkins, Edward, MD  metFORMIN (GLUCOPHAGE) 1000 MG tablet Take 1,000 mg by mouth 2 (two) times daily with a meal.    Yes [provider]  Multiple Vitamin (MULTIVITAMIN WITH MINERALS) TABS tablet Take 1 tablet by mouth daily.   Yes [provider]  pantoprazole (PROTONIX) 40 MG tablet Take 1 tablet (40 mg total) by mouth daily. 11/07/13  Yes Kari BaarsHawkins, Edward, MD  polyethylene glycol [facility](MIRALAX / Ethelene HalGLYCOLAX) packet Take 17 g by mouth daily.   Yes [provider]  sennosides-docusate sodium (SENOKOT-S) 8.6-50 MG tablet Take 1 tablet by mouth 2 (two) times daily.    Yes [provider]  tamsulosin (FLOMAX) 0.4 MG CAPS capsule  Take 0.4 mg by mouth.   Yes [provider]  umeclidinium bromide (INCRUSE ELLIPTA) 62.5 MCG/INH AEPB Inhale 1 puff into the lungs daily.   Yes [provider]  venlafaxine XR (EFFEXOR-XR) 150 MG 24 hr capsule Take 1 capsule (150 mg total) by mouth daily with breakfast. Patient taking differently: Take 150 mg by mouth daily with breakfast. Take with 75mg  for a total of 225mg  11/07/13  Yes Kari Baars, MD  acetaminophen (TYLENOL) 325 MG tablet Take 650 mg by mouth every 8  (eight) hours as needed.    [provider]  budesonide-formoterol (SYMBICORT) 160-4.5 MCG/ACT inhaler Inhale 2 puffs into the lungs 2 (two) times daily. 07/14/15   Ramonita Lab, MD  docusate sodium 100 MG CAPS Take 100 mg by mouth daily. Patient not taking: Reported on 09/02/2017 11/07/13   Kari Baars, MD  insulin aspart (NOVOLOG) 100 UNIT/ML injection Inject 0-9 Units into the skin 3 (three) times daily with meals. Patient not taking: Reported on 09/02/2017 11/07/13   Kari Baars, MD  insulin aspart (NOVOLOG) 100 UNIT/ML injection Inject 0-5 Units into the skin at bedtime. Patient not taking: Reported on 09/02/2017 11/07/13   Kari Baars, MD  nitroGLYCERIN (NITROSTAT) 0.4 MG SL tablet Place 1 tablet (0.4 mg total) under the tongue every 5 (five) minutes as needed for chest pain. 11/07/13   Kari Baars, MD  predniSONE (STERAPRED UNI-PAK 21 TAB) 10 MG (21) TBPK tablet Take 1 tablet (10 mg total) by mouth daily. Take 6 tablets by mouth for 1 day followed by  5 tablets by mouth for 1 day followed by  4 tablets by mouth for 1 day followed by  3 tablets by mouth for 1 day followed by  2 tablets by mouth for 1 day followed by  1 tablet by mouth for a day and stop Patient not taking: Reported on 09/02/2017 07/14/15   Ramonita Lab, MD  theophylline (THEODUR) 200 MG 12 hr tablet Take 1 tablet (200 mg total) by mouth every morning. Patient not taking: Reported on 09/02/2017 07/14/15   Ramonita Lab, MD  tiotropium (SPIRIVA) 18 MCG inhalation capsule Place 1 capsule (18 mcg total) into inhaler and inhale daily. Patient not taking: Reported on 09/02/2017 07/14/15   Ramonita Lab, MD    Allergies Yellow jacket venom; Aggrenox [aspirin-dipyridamole er]; Bactrim [sulfamethoxazole-trimethoprim]; Betadine prepstick plus; Contrast media [iodinated diagnostic agents]; Cortisone; Penicillins; Salsalate; Sulfa antibiotics; and Tegretol [carbamazepine]    Social History Social History    Tobacco Use  . Smoking status: Never Smoker  . Smokeless tobacco: Never Used  Substance Use Topics  . Alcohol use: No  . Drug use: No    Review of Systems Patient denies headaches, rhinorrhea, blurry vision, numbness, shortness of breath, chest pain, edema, cough, abdominal pain, nausea, vomiting, diarrhea, dysuria, fevers, rashes or hallucinations unless otherwise stated above in HPI. ____________________________________________   PHYSICAL EXAM:  VITAL SIGNS: Vitals:   09/02/17 1900 09/02/17 1930  BP: 107/80 (!) 119/57  Pulse:  71  Resp: 19 16  Temp:    SpO2:  93%    Constitutional: chronically ill appearing in protecting his airway. Eyes: Conjunctivae are normal. Pupils 2mm bilaterally Head: Atraumatic. Nose: No congestion/rhinnorhea. Mouth/Throat: Mucous membranes are moist.   Neck: No stridor. Painless ROM.  Cardiovascular: Normal rate, regular rhythm. Grossly normal heart sounds.  Good peripheral circulation. Respiratory: Normal respiratory effort.  No retractions. Lungs CTAB. Gastrointestinal: Soft and nontender. No distention. No abdominal bruits. No CVA tenderness. Musculoskeletal: Tenderness to palpation  with bilateral lower extremities diffusely.  Family states this is chronic Neurologic: Patient encephalopathic and uncooperative with exam.  Will give thumbs up the right upper extremity and does have some effort against gravity but is otherwise not cooperative with exam  skin:  Skin is warm, dry and intact. No rash noted. Psychiatric: Tearful when speaking about the patient's wife.  Unable to fully evaluate due to encephalopathy ____________________________________________   LABS (all labs ordered are listed, but only abnormal results are displayed)  Results for orders placed or performed during the hospital encounter of 09/02/17 (from the past 24 hour(s))  Comprehensive metabolic panel     Status: Abnormal   Collection Time: 09/02/17  1:32 PM  Result Value  Ref Range   Sodium 137 135 - 145 mmol/L   Potassium 4.7 3.5 - 5.1 mmol/L   Chloride 103 101 - 111 mmol/L   CO2 23 22 - 32 mmol/L   Glucose, Bld 121 (H) 65 - 99 mg/dL   BUN 21 (H) 6 - 20 mg/dL   Creatinine, Ser 6.570.98 0.61 - 1.24 mg/dL   Calcium 9.8 8.9 - 84.610.3 mg/dL   Total Protein 6.8 6.5 - 8.1 g/dL   Albumin 3.6 3.5 - 5.0 g/dL   AST 24 15 - 41 U/L   ALT 9 (L) 17 - 63 U/L   Alkaline Phosphatase 70 38 - 126 U/L   Total Bilirubin 0.7 0.3 - 1.2 mg/dL   GFR calc non Af Amer >60 >60 mL/min   GFR calc Af Amer >60 >60 mL/min   Anion gap 11 5 - 15  Troponin I     Status: None   Collection Time: 09/02/17  1:32 PM  Result Value Ref Range   Troponin I <0.03 <0.03 ng/mL  CBC WITH DIFFERENTIAL     Status: Abnormal   Collection Time: 09/02/17  1:32 PM  Result Value Ref Range   WBC 10.6 3.8 - 10.6 K/uL   RBC 4.05 (L) 4.40 - 5.90 MIL/uL   Hemoglobin 12.5 (L) 13.0 - 18.0 g/dL   HCT 96.238.3 (L) 95.240.0 - 84.152.0 %   MCV 94.5 80.0 - 100.0 fL   MCH 30.7 26.0 - 34.0 pg   MCHC 32.5 32.0 - 36.0 g/dL   RDW 32.418.3 (H) 40.111.5 - 02.714.5 %   Platelets 136 (L) 150 - 440 K/uL   Neutrophils Relative % 51 %   Neutro Abs 5.4 1.4 - 6.5 K/uL   Lymphocytes Relative 37 %   Lymphs Abs 4.0 (H) 1.0 - 3.6 K/uL   Monocytes Relative 8 %   Monocytes Absolute 0.8 0.2 - 1.0 K/uL   Eosinophils Relative 3 %   Eosinophils Absolute 0.3 0 - 0.7 K/uL   Basophils Relative 1 %   Basophils Absolute 0.1 0 - 0.1 K/uL  Urinalysis, Complete w Microscopic     Status: Abnormal   Collection Time: 09/02/17  5:50 PM  Result Value Ref Range   Color, Urine YELLOW (A) YELLOW   APPearance CLEAR (A) CLEAR   Specific Gravity, Urine 1.015 1.005 - 1.030   pH 6.0 5.0 - 8.0   Glucose, UA NEGATIVE NEGATIVE mg/dL   Hgb urine dipstick SMALL (A) NEGATIVE   Bilirubin Urine NEGATIVE NEGATIVE   Ketones, ur NEGATIVE NEGATIVE mg/dL   Protein, ur NEGATIVE NEGATIVE mg/dL   Nitrite NEGATIVE NEGATIVE   Leukocytes, UA NEGATIVE NEGATIVE   RBC / HPF 0-5 0 - 5  RBC/hpf   WBC, UA 0-5 0 - 5 WBC/hpf  Bacteria, UA NONE SEEN NONE SEEN   Squamous Epithelial / LPF 0-5 (A) NONE SEEN  Urine Drug Screen, Qualitative     Status: None   Collection Time: 09/02/17  5:50 PM  Result Value Ref Range   Tricyclic, Ur Screen NONE DETECTED NONE DETECTED   Amphetamines, Ur Screen NONE DETECTED NONE DETECTED   MDMA (Ecstasy)Ur Screen NONE DETECTED NONE DETECTED   Cocaine Metabolite,Ur [place] NONE DETECTED NONE DETECTED   Opiate, Ur Screen NONE DETECTED NONE DETECTED   Phencyclidine (PCP) Ur S NONE DETECTED NONE DETECTED   Cannabinoid 50 Ng, Ur Ranger NONE DETECTED NONE DETECTED   Barbiturates, Ur Screen NONE DETECTED NONE DETECTED   Benzodiazepine, Ur Scrn NONE DETECTED NONE DETECTED   Methadone Scn, Ur NONE DETECTED NONE DETECTED   ____________________________________________  EKG My review and personal interpretation at Time: 13:31   Indication: AMS  Rate: 80  Rhythm: sinus Axis: normal Other: normal intervals, non specific st changes ____________________________________________  RADIOLOGY  I personally reviewed all radiographic images ordered to evaluate for the above acute complaints and reviewed radiology reports and findings.  These findings were personally discussed with the patient.  Please see medical record for radiology report.  ____________________________________________   PROCEDURES  Procedure(s) performed:  Procedures    Critical Care performed: no ____________________________________________   INITIAL IMPRESSION / ASSESSMENT AND PLAN / ED COURSE  Pertinent labs & imaging results that were available during my care of the patient were reviewed by me and considered in my medical decision making (see chart for details).  DDX: Dehydration, sepsis, pna, uti, hypoglycemia, cva, drug effect, withdrawal, encephalitis   TREW SUNDE is a 83 y.o. who presents to the ED with symptoms as described above.  Patient afebrile hemodynamically stable  but is ill-appearing.  Patient arrives with a DNR.  Currently protecting his airway.  CT imaging ordered due to concern for CVA or bleed.  Blood work will be sent for the above differential.  Possible stroke but the differential remains very broad at this time.  Does not clinically appear septic.  His abdominal exam is soft and benign.  Seems less consistent with ACS or cardiogenic shock based on normal vital signs.  Will check for urinary tract infection.  Will check for pneumonia.  Possible worsening of his underlying dementia.  The patient will be placed on continuous pulse oximetry and telemetry for monitoring.  Laboratory evaluation will be sent to evaluate for the above complaints.     Clinical Course as of Sep 02 1945  Tue Sep 02, 2017  1500 Based on family description stating that patient had similar presentation with his previous CVA for which he received TPA I did speak with Dr. Thad Ranger of neurology who kindly evaluated patient at bedside.  After further discussion with family the patient states that he would not have wanted TPA or any other interventions as he is DNR.  Therefore code stroke was canceled.  I do not identify any other metabolic explanation for his altered mental status at this point.  Will continue to monitor.  [PR]  1510 Patient with Dr. Thad Ranger and family given his goals of care will order MRI to evaluate if there is any evidence of acute pathology at this time as well as continue with medical workup.  If otherwise stable I do believe that he would be appropriate for discharge back to the facility.  We will continue to monitor here in the ER.  [PR]  1709 MRI with no evidence of  acute infarct.  Patient is now behaving normally.  Currently waiting on urinalysis.  [PR]  1855 No evidence of urinary tract infection.  Patient remains hemodynamically stable.  Workup in the ER shows no acute pathology.  Spoke with patient's family regarding the patient's goals of care as he is DNR will  go to comfort care and no staff well the facility would prefer to be discharged back.  I do believe this is reasonable.  Have discussed with the patient and available family all diagnostics and treatments performed thus far and all questions were answered to the best of my ability. Family demonstrates understanding and agreement with plan.   [PR]    Clinical Course User Index [PR] Willy Eddy, MD     ____________________________________________   FINAL CLINICAL IMPRESSION(S) / ED DIAGNOSES  Final diagnoses:  Altered mental status, unspecified altered mental status type  Dementia without behavioral disturbance, unspecified dementia type      NEW MEDICATIONS STARTED DURING THIS VISIT:  This SmartLink is deprecated. Use AVSMEDLIST instead to display the medication list for a patient.   Note:  This document was prepared using Dragon voice recognition software and may include unintentional dictation errors.    Willy Eddy, MD 09/02/17 1946

## 2017-09-02 NOTE — ED Notes (Signed)
Pt had a BM.  Pt cleaned and changed by this nurse and Judeth CornfieldStephanie, RN.

## 2017-09-02 NOTE — Discharge Instructions (Signed)
[facility]  Please return to the ER should he develop any worsening symptoms or for any other questions or concerns.

## 2017-09-02 NOTE — Consult Note (Signed)
Referring Physician: Roxan Hockey    Chief Complaint: [place] Setting  HPI: [name] is an 83 y.o. male with a history of stroke who was found today at his facility unresponsive.  Patient was brought in for evaluation.  Patient unable to provide history due to mental status.  All history obtained from daughter.  It appears that the patient has been very tearful and depressed lately.  Had a bad day on Friday but was doing much better on Sunday when family came by and ate well. Patient has recently had "ups" and "downs" and had his medications adjusted (Venlafaxine).   At baseline the patient is nonambulatory and incontinent.  Is able to speak and feed himself.   Initial  NIHSS of 37. Patient received tPA with last stroke and has requested to not receive tPA again.  Due to modified Rankin is not an intervention candidate.  Date last known well: Date: 09/02/2017 Time last known well: Unable to determine tPA Given: No: improving symptoms, no consent  Modified Rankin: Rankin Score=5  Past Medical History:  Diagnosis Date  . COPD (chronic obstructive pulmonary disease) (HCC)   . CVA (cerebral infarction)   . Diabetes mellitus   . GI bleed   . Hypertension   . MDD (major depressive disorder)    w/ paranoia  . PTSD (post-traumatic stress disorder)   . Vascular dementia     Past Surgical History:  Procedure Laterality Date  . TOTAL HIP ARTHROPLASTY Right     Family history: Patient unable to provide due to mental status  Social History:  reports that  has never smoked. he has never used smokeless tobacco. He reports that he does not drink alcohol or use drugs.  Allergies:  Allergies  Allergen Reactions  . Yellow Jacket Venom Anaphylaxis  . Aggrenox [Aspirin-Dipyridamole Er]   . Bactrim [Sulfamethoxazole-Trimethoprim]   . Betadine Prepstick Plus Other (See Comments)    Reaction: unknown  . Contrast Media [Iodinated Diagnostic Agents] Other (See Comments)    Reaction: unknown.  Patient states that he does not remember the reaction, but it happened at the [place]  . Cortisone Other (See Comments)    Reaction: unknown  . Penicillins Other (See Comments)    Reaction: unknown  . Salsalate   . Sulfa Antibiotics Other (See Comments)    Reaction: unknown  . Tegretol [Carbamazepine]     Medications: I have reviewed the patient's current medications. Prior to Admission:  Prior to Admission medications   Medication Sig Start Date End Date Taking? Authorizing Provider  albuterol (PROVENTIL HFA;VENTOLIN HFA) 108 (90 Base) MCG/ACT inhaler Inhale 1 puff into the lungs every 6 (six) hours as needed for wheezing or shortness of breath.   Yes [provider]  ARIPiprazole (ABILIFY) 15 MG tablet Take 0.5 tablets (7.5 mg total) by mouth daily. 11/07/13  Yes Kari Baars, MD  aspirin 81 MG chewable tablet Chew 1 tablet (81 mg total) by mouth daily. 11/07/13  Yes Kari Baars, MD  atorvastatin (LIPITOR) 40 MG tablet Take 20 mg by mouth at bedtime.   Yes [provider]  carbamide peroxide (DEBROX) 6.5 % otic solution Place 10 drops into both ears 2 (two) times daily as needed (cerum impaction).   Yes [provider]  Emollient (EUCERIN) lotion Apply topically as needed for dry skin.   Yes [provider]  gabapentin (NEURONTIN) 300 MG capsule Take 300 mg by mouth 2 (two) times daily.   Yes [provider]  ketoconazole (NIZORAL) 2 %  shampoo Apply 1 application topically 2 (two) times a week.   Yes [provider]  magnesium oxide (MAG-OX) 400 (241.3 MG) MG tablet Take 2 tablets (800 mg total) by mouth daily. 11/07/13  Yes Kari BaarsHawkins, Edward, MD  metFORMIN (GLUCOPHAGE) 1000 MG tablet Take 1,000 mg by mouth 2 (two) times daily with a meal.    Yes [provider]  Multiple Vitamin (MULTIVITAMIN WITH MINERALS) TABS tablet Take 1 tablet by mouth daily.   Yes [provider]  pantoprazole (PROTONIX) 40 MG tablet Take 1  tablet (40 mg total) by mouth daily. 11/07/13  Yes Kari BaarsHawkins, Edward, MD  polyethylene glycol [facility](MIRALAX / Ethelene HalGLYCOLAX) packet Take 17 g by mouth daily.   Yes [provider]  sennosides-docusate sodium (SENOKOT-S) 8.6-50 MG tablet Take 1 tablet by mouth 2 (two) times daily.    Yes [provider]  tamsulosin (FLOMAX) 0.4 MG CAPS capsule Take 0.4 mg by mouth.   Yes [provider]  umeclidinium bromide (INCRUSE ELLIPTA) 62.5 MCG/INH AEPB Inhale 1 puff into the lungs daily.   Yes [provider]  venlafaxine XR (EFFEXOR-XR) 150 MG 24 hr capsule Take 1 capsule (150 mg total) by mouth daily with breakfast. Patient taking differently: Take 150 mg by mouth daily with breakfast. Take with 75mg  for a total of 225mg  11/07/13  Yes Kari BaarsHawkins, Edward, MD  acetaminophen (TYLENOL) 325 MG tablet Take 650 mg by mouth every 8 (eight) hours as needed.    [provider]  budesonide-formoterol (SYMBICORT) 160-4.5 MCG/ACT inhaler Inhale 2 puffs into the lungs 2 (two) times daily. 07/14/15   Ramonita LabGouru, Aruna, MD  docusate sodium 100 MG CAPS Take 100 mg by mouth daily. Patient not taking: Reported on 09/02/2017 11/07/13   Kari BaarsHawkins, Edward, MD  insulin aspart (NOVOLOG) 100 UNIT/ML injection Inject 0-9 Units into the skin 3 (three) times daily with meals. Patient not taking: Reported on 09/02/2017 11/07/13   Kari BaarsHawkins, Edward, MD  insulin aspart (NOVOLOG) 100 UNIT/ML injection Inject 0-5 Units into the skin at bedtime. Patient not taking: Reported on 09/02/2017 11/07/13   Kari BaarsHawkins, Edward, MD  nitroGLYCERIN (NITROSTAT) 0.4 MG SL tablet Place 1 tablet (0.4 mg total) under the tongue every 5 (five) minutes as needed for chest pain. 11/07/13   Kari BaarsHawkins, Edward, MD  predniSONE (STERAPRED UNI-PAK 21 TAB) 10 MG (21) TBPK tablet Take 1 tablet (10 mg total) by mouth daily. Take 6 tablets by mouth for 1 day followed by  5 tablets by mouth for 1 day followed by  4 tablets by mouth for 1 day followed by  3  tablets by mouth for 1 day followed by  2 tablets by mouth for 1 day followed by  1 tablet by mouth for a day and stop Patient not taking: Reported on 09/02/2017 07/14/15   Ramonita LabGouru, Aruna, MD  theophylline (THEODUR) 200 MG 12 hr tablet Take 1 tablet (200 mg total) by mouth every morning. Patient not taking: Reported on 09/02/2017 07/14/15   Ramonita LabGouru, Aruna, MD  tiotropium (SPIRIVA) 18 MCG inhalation capsule Place 1 capsule (18 mcg total) into inhaler and inhale daily. Patient not taking: Reported on 09/02/2017 07/14/15   Ramonita LabGouru, Aruna, MD     ROS: Unable to provide due to mental status  Physical Examination: Blood pressure (!) 119/57, pulse 71, temperature (!) 96.6 F (35.9 C), temperature source Axillary, resp. rate 16, height 6\' 2"  (1.88 m), weight 90.7 kg (200 lb), SpO2 93 %.  HEENT-  Normocephalic, no lesions, without obvious abnormality.  Normal external eye and conjunctiva.  Normal TM's bilaterally.  Normal auditory canals and external ears. Normal external nose, mucus membranes and septum.  Normal pharynx. Cardiovascular- S1, S2 normal, pulses palpable throughout   Lungs- chest clear, no wheezing, rales, normal symmetric air entry Abdomen- soft, non-tender; bowel sounds normal; no masses,  no organomegaly Extremities- no edema Lymph-no adenopathy palpable Musculoskeletal-no joint tenderness, deformity or swelling Skin-warm and dry, no hyperpigmentation, vitiligo, or suspicious lesions  Neurological Examination   Mental Status: Unresponsive.  No speech.  Does not follow commands.  When we begin to speak of his deceased wife he begins to cry.  Cranial Nerves: II: Discs flat bilaterally.  Does not blink to bilateral confrontation.  Pupils equal, round, reactive to light and accommodation III,IV, VI: ptosis not present, intact oculocephalic maneuvers V,VII: decreased right NLF, right eye easier to open actively VIII: unable to test IX,X: unable to test XI: unable to test XII: unable to  test Motor: Does not cooperate for formal testing.  Does no move extremities Sensory: Does not respond to painful stimuli in all extremities  Deep Tendon Reflexes: 2+ in the upper extremities, trace at the knees and absent at the ankles Plantars: Right: mute   Left: mute Cerebellar: Unable to perform Gait: unable to test due to mental status    Laboratory Studies:  Basic Metabolic Panel: Recent Labs  Lab 09/02/17 1332  NA 137  K 4.7  CL 103  CO2 23  GLUCOSE 121*  BUN 21*  CREATININE 0.98  CALCIUM 9.8    Liver Function Tests: Recent Labs  Lab 09/02/17 1332  AST 24  ALT 9*  ALKPHOS 70  BILITOT 0.7  PROT 6.8  ALBUMIN 3.6   No results for input(s): LIPASE, AMYLASE in the last 168 hours. No results for input(s): AMMONIA in the last 168 hours.  CBC: Recent Labs  Lab 09/02/17 1332  WBC 10.6  NEUTROABS 5.4  HGB 12.5*  HCT 38.3*  MCV 94.5  PLT 136*    Cardiac Enzymes: Recent Labs  Lab 09/02/17 1332  TROPONINI <0.03    BNP: Invalid input(s): POCBNP  CBG: No results for input(s): GLUCAP in the last 168 hours.  Microbiology: Results for orders placed or performed during the hospital encounter of 12/21/15  Urine culture     Status: None   Collection Time: 12/21/15 11:00 AM  Result Value Ref Range Status   Specimen Description URINE, RANDOM  Final   Special Requests NONE  Final   Culture NO GROWTH 2 DAYS  Final   Report Status 12/23/2015 FINAL  Final    Coagulation Studies: No results for input(s): LABPROT, INR in the last 72 hours.  Urinalysis:  Recent Labs  Lab 09/02/17 1750  COLORURINE YELLOW*  LABSPEC 1.015  PHURINE 6.0  GLUCOSEU NEGATIVE  HGBUR SMALL*  BILIRUBINUR NEGATIVE  KETONESUR NEGATIVE  PROTEINUR NEGATIVE  NITRITE NEGATIVE  LEUKOCYTESUR NEGATIVE    Lipid Panel:    Component Value Date/Time   CHOL  12/03/2010 1112    153        ATP III CLASSIFICATION:  <200     mg/dL   Desirable  811-914  mg/dL   Borderline High   >=782    mg/dL   High          TRIG 85 12/03/2010 1112   HDL 57 12/03/2010 1112   CHOLHDL 2.7 12/03/2010 1112   VLDL 17 12/03/2010 1112   LDLCALC  12/03/2010 1112    79  Total Cholesterol/HDL:CHD Risk Coronary Heart Disease Risk Table                     Men   Women  1/2 Average Risk   3.4   3.3  Average Risk       5.0   4.4  2 X Average Risk   9.6   7.1  3 X Average Risk  23.4   11.0        Use the calculated Patient Ratio above and the CHD Risk Table to determine the patient's CHD Risk.        ATP III CLASSIFICATION (LDL):  <100     mg/dL   Optimal  [id number]  mg/dL   Near or Above                    Optimal  130-159  mg/dL   Borderline  [id number]  mg/dL   High  >956>190     mg/dL   Very High    [id number]:  Lab Results  Component Value Date   HGBA1C 6.2 (H) 07/11/2015    Urine Drug Screen:      Component Value Date/Time   LABOPIA NONE DETECTED 09/02/2017 1750   LABOPIA NONE DETECTED 11/30/2011 0348   COCAINSCRNUR NONE DETECTED 09/02/2017 1750   LABBENZ NONE DETECTED 09/02/2017 1750   LABBENZ NONE DETECTED 11/30/2011 0348   AMPHETMU NONE DETECTED 09/02/2017 1750   AMPHETMU NONE DETECTED 11/30/2011 0348   THCU NONE DETECTED 09/02/2017 1750   THCU NONE DETECTED 11/30/2011 0348   LABBARB NONE DETECTED 09/02/2017 1750   LABBARB NONE DETECTED 11/30/2011 0348    Alcohol Level: No results for input(s): ETH in the last 168 hours.  Other results: EKG: sinus rhythm at 80 bpm.  Imaging: Ct Head Wo Contrast  Result Date: 09/02/2017 CLINICAL DATA:  Acute mental status changes which occurred at 11:30 a.m. this morning. Personal history of stroke. EXAM: CT HEAD WITHOUT CONTRAST TECHNIQUE: Contiguous axial images were obtained from the base of the skull through the vertex without intravenous contrast. COMPARISON:  CT head 11/05/2013, 11/30/2011 and earlier. MRI brain 11/30/2010. FINDINGS: Brain: Moderate to severe cortical and deep atrophy, unchanged. Moderate changes of small  vessel disease of the white matter, unchanged. Old lacunar strokes in the deep white matter of the left frontotemporal region, unchanged. Physiologic calcifications in the basal ganglia. Left convexity subdural hygroma, unchanged. No mass lesion. No midline shift. No acute hemorrhage or hematoma. No extra-axial fluid collections. No evidence of acute infarction. Vascular: Moderate bilateral carotid siphon and bilateral vertebral artery atherosclerosis. No hyperdense vessel. Skull: No skull fracture or other focal osseous abnormality involving the skull. Sinuses/Orbits: Air-fluid levels in both sphenoid sinuses. Remaining visualized paranasal sinuses, bilateral mastoid air cells and bilateral middle ear cavities well-aerated. Visualized orbits and globes unremarkable. Other: None. IMPRESSION: 1. No acute intracranial abnormality. 2. Stable moderate to severe generalized atrophy and moderate chronic microvascular ischemic changes of the white matter. 3. Remote lacunar strokes in the deep white matter of the left frontotemporal region. Electronically Signed   By: Hulan Saashomas  Lawrence M.D.   On: 09/02/2017 14:18   Mr Brain Wo Contrast  Result Date: 09/02/2017 CLINICAL DATA:  Altered level of consciousness. EXAM: MRI HEAD WITHOUT CONTRAST TECHNIQUE: Multiplanar, multiecho pulse sequences of the brain and surrounding structures were obtained without intravenous contrast. COMPARISON:  CT head 09/02/2017 FINDINGS: Brain: Moderate atrophy. Negative for hydrocephalus. Chronic hemorrhagic infarct left deep white matter structures. Chronic microvascular  ischemic change in the white matter and thalamus bilaterally. Negative for acute infarct or mass. No shift of the midline structures. Vascular: Normal arterial flow voids Skull and upper cervical spine: Negative Sinuses/Orbits: Air-fluid level right sphenoid sinus. Remaining sinuses clear. Bilateral lens replacement Other: None IMPRESSION: Moderate atrophy and moderate chronic  ischemic change. No acute intracranial abnormality. Electronically Signed   By: Marlan Palau M.D.   On: 09/02/2017 16:59   Dg Chest Port 1 View  Result Date: 09/02/2017 CLINICAL DATA:  Cough and altered mental status EXAM: PORTABLE CHEST 1 VIEW COMPARISON:  Chest radiograph July 11, 2015 and chest CT July 11, 2015 FINDINGS: There is a small left pleural effusion with left base atelectasis. The lungs elsewhere are clear. Heart is upper normal in size with pulmonary vascularity within normal limits. No adenopathy. There is aortic atherosclerosis. There is calcification in the left carotid artery. No evident bone lesions. IMPRESSION: Small left pleural effusion with left base atelectasis. The lungs elsewhere clear. Heart upper normal in size with pulmonary vascularity within normal limits. There is aortic atherosclerosis as well as left carotid artery calcification. No adenopathy evident. Aortic Atherosclerosis (ICD10-I70.0). Electronically Signed   By: Bretta Bang III M.D.   On: 09/02/2017 14:09    Assessment: 83 y.o. male presenting unresponsive.  Although per daughter patient had a similar presentation with his previous stroke, today's presentation appears more psychiatric than related to cerebrovascular disease.  Head CT reviewed and shows no acute changes.  tPA not indicated.  Further work up recommended.    Stroke Risk Factors - diabetes mellitus and hypertension  Plan: 1. MRI of the brain without contrast.  If MRI of the brain shows no acute changes, no further neurological intervention recommended at this time.   2. Continue ASA 3. Frequent neuro checks 4. Telemetry  Case discussed with Dr. Zettie Cooley, MD Neurology [phone] 09/02/2017, 10:07 PM

## 2017-09-02 NOTE — ED Notes (Signed)
This nurse and Jae DireKate, RN going to bedside to catheterize patient for urine.  Upon entering the room the patient was alert and family states has been talking for about 10 minutes.  Patient oriented to self, place and situation.  Patient wanted to try to urinate into urinal but unable at this time.  MRI at bedside to take patient, will attempt when patient returns.

## 2017-09-02 NOTE — ED Notes (Signed)
Dr. Thad Rangereynolds at bedside.  Code stroke cancelled.

## 2017-09-07 LAB — CULTURE, BLOOD (ROUTINE X 2)
Culture: NO GROWTH
Culture: NO GROWTH
SPECIAL REQUESTS: ADEQUATE
SPECIAL REQUESTS: ADEQUATE

## 2018-02-17 ENCOUNTER — Inpatient Hospital Stay
Admission: EM | Admit: 2018-02-17 | Discharge: 2018-02-20 | DRG: 871 | Disposition: A | Discharge status: Skilled Nursing Facility | Payer: Medicare Other | Attending: Internal Medicine | Admitting: Internal Medicine

## 2018-02-17 ENCOUNTER — Emergency Department: Payer: Medicare Other

## 2018-02-17 ENCOUNTER — Other Ambulatory Visit: Payer: Self-pay

## 2018-02-17 ENCOUNTER — Encounter: Payer: Self-pay | Admitting: Emergency Medicine

## 2018-02-17 DIAGNOSIS — Z79899 Other long term (current) drug therapy: Secondary | ICD-10-CM

## 2018-02-17 DIAGNOSIS — Y95 Nosocomial condition: Secondary | ICD-10-CM | POA: Diagnosis present

## 2018-02-17 DIAGNOSIS — E872 Acidosis: Secondary | ICD-10-CM | POA: Diagnosis present

## 2018-02-17 DIAGNOSIS — Z881 Allergy status to other antibiotic agents status: Secondary | ICD-10-CM

## 2018-02-17 DIAGNOSIS — J440 Chronic obstructive pulmonary disease with acute lower respiratory infection: Secondary | ICD-10-CM | POA: Diagnosis present

## 2018-02-17 DIAGNOSIS — Z8673 Personal history of transient ischemic attack (TIA), and cerebral infarction without residual deficits: Secondary | ICD-10-CM | POA: Diagnosis not present

## 2018-02-17 DIAGNOSIS — Z886 Allergy status to analgesic agent status: Secondary | ICD-10-CM | POA: Diagnosis not present

## 2018-02-17 DIAGNOSIS — Z888 Allergy status to other drugs, medicaments and biological substances status: Secondary | ICD-10-CM

## 2018-02-17 DIAGNOSIS — I10 Essential (primary) hypertension: Secondary | ICD-10-CM | POA: Diagnosis present

## 2018-02-17 DIAGNOSIS — A419 Sepsis, unspecified organism: Secondary | ICD-10-CM | POA: Diagnosis present

## 2018-02-17 DIAGNOSIS — D696 Thrombocytopenia, unspecified: Secondary | ICD-10-CM | POA: Diagnosis present

## 2018-02-17 DIAGNOSIS — Z7984 Long term (current) use of oral hypoglycemic drugs: Secondary | ICD-10-CM

## 2018-02-17 DIAGNOSIS — E119 Type 2 diabetes mellitus without complications: Secondary | ICD-10-CM | POA: Diagnosis present

## 2018-02-17 DIAGNOSIS — J181 Lobar pneumonia, unspecified organism: Secondary | ICD-10-CM | POA: Diagnosis present

## 2018-02-17 DIAGNOSIS — Z885 Allergy status to narcotic agent status: Secondary | ICD-10-CM | POA: Diagnosis not present

## 2018-02-17 DIAGNOSIS — Z882 Allergy status to sulfonamides status: Secondary | ICD-10-CM | POA: Diagnosis not present

## 2018-02-17 DIAGNOSIS — Z91041 Radiographic dye allergy status: Secondary | ICD-10-CM

## 2018-02-17 DIAGNOSIS — Z91030 Bee allergy status: Secondary | ICD-10-CM | POA: Diagnosis not present

## 2018-02-17 DIAGNOSIS — Z880 Allergy status to penicillin: Secondary | ICD-10-CM

## 2018-02-17 DIAGNOSIS — Z883 Allergy status to other anti-infective agents status: Secondary | ICD-10-CM

## 2018-02-17 DIAGNOSIS — F4310 Post-traumatic stress disorder, unspecified: Secondary | ICD-10-CM | POA: Diagnosis present

## 2018-02-17 DIAGNOSIS — Z887 Allergy status to serum and vaccine status: Secondary | ICD-10-CM | POA: Diagnosis not present

## 2018-02-17 DIAGNOSIS — R7989 Other specified abnormal findings of blood chemistry: Secondary | ICD-10-CM

## 2018-02-17 DIAGNOSIS — J9621 Acute and chronic respiratory failure with hypoxia: Secondary | ICD-10-CM | POA: Diagnosis present

## 2018-02-17 DIAGNOSIS — Z96641 Presence of right artificial hip joint: Secondary | ICD-10-CM | POA: Diagnosis present

## 2018-02-17 DIAGNOSIS — Z66 Do not resuscitate: Secondary | ICD-10-CM | POA: Diagnosis present

## 2018-02-17 DIAGNOSIS — F0150 Vascular dementia without behavioral disturbance: Secondary | ICD-10-CM | POA: Diagnosis present

## 2018-02-17 DIAGNOSIS — J441 Chronic obstructive pulmonary disease with (acute) exacerbation: Secondary | ICD-10-CM | POA: Diagnosis present

## 2018-02-17 DIAGNOSIS — J9601 Acute respiratory failure with hypoxia: Secondary | ICD-10-CM

## 2018-02-17 DIAGNOSIS — Z9981 Dependence on supplemental oxygen: Secondary | ICD-10-CM

## 2018-02-17 LAB — CBC WITH DIFFERENTIAL/PLATELET
BASOS PCT: 0 %
Basophils Absolute: 0 10*3/uL (ref 0–0.1)
Eosinophils Absolute: 0.1 10*3/uL (ref 0–0.7)
Eosinophils Relative: 1 %
HEMATOCRIT: 34.4 % — AB (ref 40.0–52.0)
HEMOGLOBIN: 11.2 g/dL — AB (ref 13.0–18.0)
LYMPHS ABS: 2.5 10*3/uL (ref 1.0–3.6)
Lymphocytes Relative: 23 %
MCH: 29.8 pg (ref 26.0–34.0)
MCHC: 32.7 g/dL (ref 32.0–36.0)
MCV: 91.2 fL (ref 80.0–100.0)
MONO ABS: 0.5 10*3/uL (ref 0.2–1.0)
MONOS PCT: 5 %
NEUTROS ABS: 7.5 10*3/uL — AB (ref 1.4–6.5)
Neutrophils Relative %: 71 %
Platelets: 94 10*3/uL — ABNORMAL LOW (ref 150–440)
RBC: 3.77 MIL/uL — ABNORMAL LOW (ref 4.40–5.90)
RDW: 17.4 % — AB (ref 11.5–14.5)
WBC: 10.6 10*3/uL (ref 3.8–10.6)

## 2018-02-17 LAB — LACTIC ACID, PLASMA
LACTIC ACID, VENOUS: 4.8 mmol/L — AB (ref 0.5–1.9)
Lactic Acid, Venous: 4.2 mmol/L (ref 0.5–1.9)

## 2018-02-17 LAB — GLUCOSE, CAPILLARY: GLUCOSE-CAPILLARY: 150 mg/dL — AB (ref 65–99)

## 2018-02-17 LAB — BLOOD GAS, VENOUS
Acid-base deficit: 0.5 mmol/L (ref 0.0–2.0)
Bicarbonate: 24.9 mmol/L (ref 20.0–28.0)
O2 SAT: 90.6 %
PCO2 VEN: 43 mmHg — AB (ref 44.0–60.0)
PH VEN: 7.37 (ref 7.250–7.430)
PO2 VEN: 62 mmHg — AB (ref 32.0–45.0)
Patient temperature: 37

## 2018-02-17 LAB — COMPREHENSIVE METABOLIC PANEL
ALBUMIN: 3.9 g/dL (ref 3.5–5.0)
ALT: 16 U/L — AB (ref 17–63)
AST: 32 U/L (ref 15–41)
Alkaline Phosphatase: 60 U/L (ref 38–126)
Anion gap: 13 (ref 5–15)
BILIRUBIN TOTAL: 0.5 mg/dL (ref 0.3–1.2)
BUN: 27 mg/dL — AB (ref 6–20)
CHLORIDE: 101 mmol/L (ref 101–111)
CO2: 21 mmol/L — ABNORMAL LOW (ref 22–32)
CREATININE: 0.92 mg/dL (ref 0.61–1.24)
Calcium: 9.3 mg/dL (ref 8.9–10.3)
GFR calc Af Amer: >60 mL/min (ref >60)
GLUCOSE: 105 mg/dL — AB (ref 65–99)
Potassium: 5 mmol/L (ref 3.5–5.1)
Sodium: 135 mmol/L (ref 135–145)
TOTAL PROTEIN: 7 g/dL (ref 6.5–8.1)

## 2018-02-17 LAB — PROTIME-INR
INR: 1.08
Prothrombin Time: 13.9 seconds (ref 11.4–15.2)

## 2018-02-17 LAB — TROPONIN I: Troponin I: <0.03 ng/mL (ref <0.03)

## 2018-02-17 MED ORDER — IPRATROPIUM-ALBUTEROL 0.5-2.5 (3) MG/3ML IN SOLN
3.0000 mL | Freq: Once | RESPIRATORY_TRACT | Status: AC
  Administered 2018-02-17: 3 mL via RESPIRATORY_TRACT
  Filled 2018-02-17: qty 3

## 2018-02-17 MED ORDER — INSULIN ASPART 100 UNIT/ML ~~LOC~~ SOLN
0.0000 [IU] | Freq: Three times a day (TID) | SUBCUTANEOUS | Status: DC
  Administered 2018-02-18: 3 [IU] via SUBCUTANEOUS
  Administered 2018-02-18: 5 [IU] via SUBCUTANEOUS
  Administered 2018-02-18 – 2018-02-19 (×4): 3 [IU] via SUBCUTANEOUS
  Administered 2018-02-20: 2 [IU] via SUBCUTANEOUS
  Administered 2018-02-20: 12:00:00 3 [IU] via SUBCUTANEOUS
  Filled 2018-02-17 (×8): qty 1

## 2018-02-17 MED ORDER — SODIUM CHLORIDE 0.9 % IV SOLN: Freq: Once | INTRAVENOUS | Status: DC

## 2018-02-17 MED ORDER — NITROGLYCERIN 0.4 MG SL SUBL: 0.4000 mg | SUBLINGUAL_TABLET | SUBLINGUAL | Status: DC | PRN

## 2018-02-17 MED ORDER — MAGNESIUM OXIDE 400 (241.3 MG) MG PO TABS
800.0000 mg | ORAL_TABLET | Freq: Every day | ORAL | Status: DC
  Administered 2018-02-18 – 2018-02-20 (×3): 800 mg via ORAL
  Filled 2018-02-17 (×3): qty 2

## 2018-02-17 MED ORDER — ONDANSETRON HCL 4 MG PO TABS: 4.0000 mg | ORAL_TABLET | Freq: Four times a day (QID) | ORAL | Status: DC | PRN

## 2018-02-17 MED ORDER — HYDROCODONE-ACETAMINOPHEN 5-325 MG PO TABS: 1.0000 | ORAL_TABLET | ORAL | Status: DC | PRN

## 2018-02-17 MED ORDER — TRAZODONE HCL 50 MG PO TABS: 25.0000 mg | ORAL_TABLET | Freq: Every evening | ORAL | Status: DC | PRN

## 2018-02-17 MED ORDER — GABAPENTIN 300 MG PO CAPS
300.0000 mg | ORAL_CAPSULE | Freq: Two times a day (BID) | ORAL | Status: DC
  Administered 2018-02-18 – 2018-02-20 (×6): 300 mg via ORAL
  Filled 2018-02-17 (×6): qty 1

## 2018-02-17 MED ORDER — METHYLPREDNISOLONE SODIUM SUCC 125 MG IJ SOLR
80.0000 mg | Freq: Two times a day (BID) | INTRAMUSCULAR | Status: DC
  Administered 2018-02-18 – 2018-02-19 (×4): 80 mg via INTRAVENOUS
  Filled 2018-02-17 (×4): qty 2

## 2018-02-17 MED ORDER — VENLAFAXINE HCL ER 75 MG PO CP24
150.0000 mg | ORAL_CAPSULE | Freq: Every day | ORAL | Status: DC
  Administered 2018-02-18 – 2018-02-20 (×3): 150 mg via ORAL
  Filled 2018-02-17 (×3): qty 2

## 2018-02-17 MED ORDER — VANCOMYCIN HCL 10 G IV SOLR
1250.0000 mg | Freq: Two times a day (BID) | INTRAVENOUS | Status: DC
  Administered 2018-02-18 – 2018-02-19 (×3): 1250 mg via INTRAVENOUS
  Filled 2018-02-17 (×5): qty 1250

## 2018-02-17 MED ORDER — SODIUM CHLORIDE 0.9 % IV SOLN
2.0000 g | Freq: Two times a day (BID) | INTRAVENOUS | Status: DC
  Administered 2018-02-18 – 2018-02-20 (×5): 2 g via INTRAVENOUS
  Filled 2018-02-17 (×7): qty 2

## 2018-02-17 MED ORDER — IPRATROPIUM-ALBUTEROL 0.5-2.5 (3) MG/3ML IN SOLN
3.0000 mL | Freq: Four times a day (QID) | RESPIRATORY_TRACT | Status: DC
  Administered 2018-02-18 (×3): 3 mL via RESPIRATORY_TRACT
  Filled 2018-02-17 (×4): qty 3

## 2018-02-17 MED ORDER — ADULT MULTIVITAMIN W/MINERALS CH
1.0000 | ORAL_TABLET | Freq: Every day | ORAL | Status: DC
  Administered 2018-02-18 – 2018-02-20 (×3): 1 via ORAL
  Filled 2018-02-17 (×3): qty 1

## 2018-02-17 MED ORDER — SODIUM CHLORIDE 0.9 % IV SOLN
INTRAVENOUS | Status: DC
  Administered 2018-02-18 – 2018-02-20 (×6): via INTRAVENOUS

## 2018-02-17 MED ORDER — METHYLPREDNISOLONE SODIUM SUCC 125 MG IJ SOLR
80.0000 mg | Freq: Once | INTRAMUSCULAR | Status: AC
  Administered 2018-02-17: 80 mg via INTRAVENOUS
  Filled 2018-02-17: qty 2

## 2018-02-17 MED ORDER — ARIPIPRAZOLE 15 MG PO TABS
7.5000 mg | ORAL_TABLET | Freq: Every day | ORAL | Status: DC
  Administered 2018-02-18 – 2018-02-20 (×3): 7.5 mg via ORAL
  Filled 2018-02-17 (×3): qty 1

## 2018-02-17 MED ORDER — VANCOMYCIN HCL IN DEXTROSE 1-5 GM/200ML-% IV SOLN
1000.0000 mg | Freq: Once | INTRAVENOUS | Status: AC
  Administered 2018-02-17: 1000 mg via INTRAVENOUS
  Filled 2018-02-17: qty 200

## 2018-02-17 MED ORDER — ASPIRIN 81 MG PO CHEW
81.0000 mg | CHEWABLE_TABLET | Freq: Every day | ORAL | Status: DC
  Administered 2018-02-18: 81 mg via ORAL
  Filled 2018-02-17: qty 1

## 2018-02-17 MED ORDER — PANTOPRAZOLE SODIUM 40 MG PO TBEC
40.0000 mg | DELAYED_RELEASE_TABLET | Freq: Every day | ORAL | Status: DC
  Administered 2018-02-18 – 2018-02-20 (×3): 40 mg via ORAL
  Filled 2018-02-17 (×3): qty 1

## 2018-02-17 MED ORDER — TAMSULOSIN HCL 0.4 MG PO CAPS
0.4000 mg | ORAL_CAPSULE | Freq: Every day | ORAL | Status: DC
  Administered 2018-02-18 – 2018-02-20 (×3): 0.4 mg via ORAL
  Filled 2018-02-17 (×3): qty 1

## 2018-02-17 MED ORDER — BISACODYL 5 MG PO TBEC: 5.0000 mg | DELAYED_RELEASE_TABLET | Freq: Every day | ORAL | Status: DC | PRN

## 2018-02-17 MED ORDER — POLYETHYLENE GLYCOL 3350 17 G PO PACK
17.0000 g | PACK | Freq: Every day | ORAL | Status: DC
  Administered 2018-02-19: 11:00:00 17 g via ORAL
  Filled 2018-02-17 (×2): qty 1

## 2018-02-17 MED ORDER — ACETAMINOPHEN 650 MG RE SUPP: 650.0000 mg | Freq: Four times a day (QID) | RECTAL | Status: DC | PRN

## 2018-02-17 MED ORDER — SODIUM CHLORIDE 0.9 % IV SOLN
2.0000 g | Freq: Once | INTRAVENOUS | Status: AC
  Administered 2018-02-17: 2 g via INTRAVENOUS
  Filled 2018-02-17: qty 2

## 2018-02-17 MED ORDER — ATORVASTATIN CALCIUM 20 MG PO TABS
20.0000 mg | ORAL_TABLET | Freq: Every day | ORAL | Status: DC
  Administered 2018-02-18 – 2018-02-19 (×3): 20 mg via ORAL
  Filled 2018-02-17 (×3): qty 1

## 2018-02-17 MED ORDER — HEPARIN SODIUM (PORCINE) 5000 UNIT/ML IJ SOLN
5000.0000 [IU] | Freq: Three times a day (TID) | INTRAMUSCULAR | Status: DC
  Administered 2018-02-18 (×2): 5000 [IU] via SUBCUTANEOUS
  Filled 2018-02-17 (×2): qty 1

## 2018-02-17 MED ORDER — INSULIN ASPART 100 UNIT/ML ~~LOC~~ SOLN
0.0000 [IU] | Freq: Every day | SUBCUTANEOUS | Status: DC
  Administered 2018-02-19: 2 [IU] via SUBCUTANEOUS
  Filled 2018-02-17: qty 1

## 2018-02-17 MED ORDER — ONDANSETRON HCL 4 MG/2ML IJ SOLN: 4.0000 mg | Freq: Four times a day (QID) | INTRAMUSCULAR | Status: DC | PRN

## 2018-02-17 MED ORDER — ACETAMINOPHEN 325 MG PO TABS: 650.0000 mg | ORAL_TABLET | Freq: Four times a day (QID) | ORAL | Status: DC | PRN

## 2018-02-17 MED ORDER — DOCUSATE SODIUM 100 MG PO CAPS
100.0000 mg | ORAL_CAPSULE | Freq: Two times a day (BID) | ORAL | Status: DC
  Administered 2018-02-18 – 2018-02-20 (×5): 100 mg via ORAL
  Filled 2018-02-17 (×5): qty 1

## 2018-02-17 MED ORDER — FLUTICASONE FUROATE-VILANTEROL 200-25 MCG/INH IN AEPB
1.0000 | INHALATION_SPRAY | Freq: Every day | RESPIRATORY_TRACT | Status: DC
  Administered 2018-02-18: 1 via RESPIRATORY_TRACT
  Filled 2018-02-17: qty 28

## 2018-02-17 NOTE — H&P (Signed)
[facility] [place] at [facility]   PATIENT NAME: [name]    MR#:  [id number]  DATE OF BIRTH:  [birth year]  DATE OF ADMISSION:  02/17/2018  PRIMARY CARE PHYSICIAN: Evangeline Gula, MD   REQUESTING/REFERRING PHYSICIAN:   CHIEF COMPLAINT:   Chief Complaint  Patient presents with  . Shortness of Breath    HISTORY OF PRESENT ILLNESS: [name]  is a 84 y.o. male, NH resident, with a known history of COPD on 3 L continuous oxygen, diabetes type 2, stroke, hypertension and vascular dementia. Patient is not able to provide history due to dementia.  Most of the information was taken from reviewing the medical records and from discussion with emergency room physician. He was transferred from nursing home due to shortness of breath and hypoxia oxygen saturation was 86% on 3 L by nasal cannula.  Generalized weakness and occasional cough were noted for the past 2 days, but no fever or chills reported. Blood test done in the emergency room are significant for elevated lactic acid at 4.8.  WBC is normal at 10.6. Chest x-ray, reviewed by myself, shows left lower lobe pneumonia. Patient is admitted for further evaluation and treatment.  PAST MEDICAL HISTORY:   Past Medical History:  Diagnosis Date  . COPD (chronic obstructive pulmonary disease) (HCC)   . CVA (cerebral infarction)   . Diabetes mellitus   . GI bleed   . Hypertension   . MDD (major depressive disorder)    w/ paranoia  . PTSD (post-traumatic stress disorder)   . Vascular dementia     PAST SURGICAL HISTORY:  Past Surgical History:  Procedure Laterality Date  . TOTAL HIP ARTHROPLASTY Right     SOCIAL HISTORY:  Social History   Tobacco Use  . Smoking status: Never Smoker  . Smokeless tobacco: Never Used  Substance Use Topics  . Alcohol use: No    FAMILY HISTORY: History reviewed. No pertinent family history.  DRUG ALLERGIES:  Allergies  Allergen Reactions  . Yellow Jacket Venom  Anaphylaxis  . Aggrenox [Aspirin-Dipyridamole Er]   . Bactrim [Sulfamethoxazole-Trimethoprim]   . Betadine Prepstick Plus Other (See Comments)    Reaction: unknown  . Contrast Media [Iodinated Diagnostic Agents] Other (See Comments)    Reaction: unknown. Patient states that he does not remember the reaction, but it happened at the [place]  . Cortisone Other (See Comments)    Reaction: unknown  . Penicillins Other (See Comments)    Reaction: unknown  . Salsalate   . Sulfa Antibiotics Other (See Comments)    Reaction: unknown  . Tegretol [Carbamazepine]     REVIEW OF SYSTEMS:   Unable to obtain, due to patient's dementia.  MEDICATIONS AT HOME:  Prior to Admission medications   Medication Sig Start Date End Date Taking? Authorizing Provider  acetaminophen (TYLENOL) 325 MG tablet Take 650 mg by mouth every 8 (eight) hours as needed.    [provider]  albuterol (PROVENTIL HFA;VENTOLIN HFA) 108 (90 Base) MCG/ACT inhaler Inhale 1 puff into the lungs every 6 (six) hours as needed for wheezing or shortness of breath.    [provider]  ARIPiprazole (ABILIFY) 15 MG tablet Take 0.5 tablets (7.5 mg total) by mouth daily. 11/07/13   Kari Baars, MD  aspirin 81 MG chewable tablet Chew 1 tablet (81 mg total) by mouth daily. 11/07/13   Kari Baars, MD  atorvastatin (LIPITOR) 40 MG tablet Take 20 mg by mouth at bedtime.    [provider]  budesonide-formoterol (SYMBICORT) 160-4.5 MCG/ACT inhaler Inhale 2 puffs into the lungs 2 (two) times daily. 07/14/15   Ramonita Lab, MD  carbamide peroxide (DEBROX) 6.5 % otic solution Place 10 drops into both ears 2 (two) times daily as needed (cerum impaction).    [provider]  docusate sodium 100 MG CAPS Take 100 mg by mouth daily. Patient not taking: Reported on 09/02/2017 11/07/13   Kari Baars, MD  Emollient (EUCERIN) lotion Apply topically as needed for dry skin.    [provider]  gabapentin  (NEURONTIN) 300 MG capsule Take 300 mg by mouth 2 (two) times daily.    [provider]  insulin aspart (NOVOLOG) 100 UNIT/ML injection Inject 0-9 Units into the skin 3 (three) times daily with meals. Patient not taking: Reported on 09/02/2017 11/07/13   Kari Baars, MD  insulin aspart (NOVOLOG) 100 UNIT/ML injection Inject 0-5 Units into the skin at bedtime. Patient not taking: Reported on 09/02/2017 11/07/13   Kari Baars, MD  ketoconazole (NIZORAL) 2 % shampoo Apply 1 application topically 2 (two) times a week.    [provider]  magnesium oxide (MAG-OX) 400 (241.3 MG) MG tablet Take 2 tablets (800 mg total) by mouth daily. 11/07/13   Kari Baars, MD  metFORMIN (GLUCOPHAGE) 1000 MG tablet Take 1,000 mg by mouth 2 (two) times daily with a meal.     [provider]  Multiple Vitamin (MULTIVITAMIN WITH MINERALS) TABS tablet Take 1 tablet by mouth daily.    [provider]  nitroGLYCERIN (NITROSTAT) 0.4 MG SL tablet Place 1 tablet (0.4 mg total) under the tongue every 5 (five) minutes as needed for chest pain. 11/07/13   Kari Baars, MD  pantoprazole (PROTONIX) 40 MG tablet Take 1 tablet (40 mg total) by mouth daily. 11/07/13   Kari Baars, MD  polyethylene glycol [facility] Ethelene Hal) packet Take 17 g by mouth daily.    [provider]  predniSONE (STERAPRED UNI-PAK 21 TAB) 10 MG (21) TBPK tablet Take 1 tablet (10 mg total) by mouth daily. Take 6 tablets by mouth for 1 day followed by  5 tablets by mouth for 1 day followed by  4 tablets by mouth for 1 day followed by  3 tablets by mouth for 1 day followed by  2 tablets by mouth for 1 day followed by  1 tablet by mouth for a day and stop Patient not taking: Reported on 09/02/2017 07/14/15   Ramonita Lab, MD  sennosides-docusate sodium (SENOKOT-S) 8.6-50 MG tablet Take 1 tablet by mouth 2 (two) times daily.     [provider]  tamsulosin (FLOMAX) 0.4 MG CAPS capsule Take 0.4 mg by  mouth.    [provider]  theophylline (THEODUR) 200 MG 12 hr tablet Take 1 tablet (200 mg total) by mouth every morning. Patient not taking: Reported on 09/02/2017 07/14/15   Ramonita Lab, MD  tiotropium (SPIRIVA) 18 MCG inhalation capsule Place 1 capsule (18 mcg total) into inhaler and inhale daily. Patient not taking: Reported on 09/02/2017 07/14/15   Ramonita Lab, MD  umeclidinium bromide (INCRUSE ELLIPTA) 62.5 MCG/INH AEPB Inhale 1 puff into the lungs daily.    [provider]  venlafaxine XR (EFFEXOR-XR) 150 MG 24 hr capsule Take 1 capsule (150 mg total) by mouth daily with breakfast. Patient taking differently: Take 150 mg by mouth daily with breakfast. Take with  for a total of  11/07/13   Kari Baars, MD      PHYSICAL EXAMINATION:  VITAL SIGNS: Blood pressure 114/69, pulse 77, temperature 98.6 F (37 C), temperature source Oral, resp. rate 18, weight 90.7 kg (200 lb), SpO2 97 %.  GENERAL:  84 y.o.-year-old patient lying in the bed with mild respiratory distress. Pt is somnolent. EYES: Pupils equal, round, reactive to light and accommodation. No scleral icterus.  HEENT: Head atraumatic, normocephalic. Oropharynx and nasopharynx clear.  NECK:  Supple, no jugular venous distention. No thyroid enlargement, no tenderness.  LUNGS: Reduced breath sounds and wheezing noted bilaterally. No use of accessory muscles of respiration, at this time.  CARDIOVASCULAR: S1, S2 normal. No S3/S4.  ABDOMEN: Soft, nontender, nondistended. Bowel sounds present. No organomegaly or mass.  EXTREMITIES: No pedal edema, cyanosis, or clubbing.  NEUROLOGIC: No focal weakness.  Gait not checked, as patient is too weak to ambulate.  PSYCHIATRIC: The patient is alert, but somnolent and confused.  SKIN: No obvious rash, lesion, or ulcer.   LABORATORY PANEL:   CBC Recent Labs  Lab 02/17/18 1927  WBC 10.6  HGB 11.2*  HCT 34.4*  PLT 94*  MCV 91.2  MCH 29.8  MCHC 32.7  RDW  17.4*  LYMPHSABS 2.5  MONOABS 0.5  EOSABS 0.1  BASOSABS 0.0   ------------------------------------------------------------------------------------------------------------------  Chemistries  Recent Labs  Lab 02/17/18 1927  NA 135  K 5.0  CL 101  CO2 21*  GLUCOSE 105*  BUN 27*  CREATININE 0.92  CALCIUM 9.3  AST 32  ALT 16*  ALKPHOS 60  BILITOT 0.5   ------------------------------------------------------------------------------------------------------------------ estimated creatinine clearance is 72 mL/min (by C-G formula based on SCr of 0.92 mg/dL). ------------------------------------------------------------------------------------------------------------------ No results for input(s): TSH, T4TOTAL, T3FREE, THYROIDAB in the last 72 hours.  Invalid input(s): FREET3   Coagulation profile Recent Labs  Lab 02/17/18 1927  INR 1.08   ------------------------------------------------------------------------------------------------------------------- No results for input(s): DDIMER in the last 72 hours. -------------------------------------------------------------------------------------------------------------------  Cardiac Enzymes Recent Labs  Lab 02/17/18 1927  TROPONINI <0.03   ------------------------------------------------------------------------------------------------------------------ Invalid input(s): POCBNP  ---------------------------------------------------------------------------------------------------------------  Urinalysis    Component Value Date/Time   COLORURINE YELLOW (A) 09/02/2017 1750   APPEARANCEUR CLEAR (A) 09/02/2017 1750   APPEARANCEUR Clear 11/22/2013 1602   LABSPEC 1.015 09/02/2017 1750   LABSPEC 1.019 11/22/2013 1602   PHURINE 6.0 09/02/2017 1750   GLUCOSEU NEGATIVE 09/02/2017 1750   GLUCOSEU Negative 11/22/2013 1602   HGBUR SMALL (A) 09/02/2017 1750   BILIRUBINUR NEGATIVE 09/02/2017 1750   BILIRUBINUR Negative 11/22/2013 1602    KETONESUR NEGATIVE 09/02/2017 1750   PROTEINUR NEGATIVE 09/02/2017 1750   UROBILINOGEN 0.2 11/06/2013 0218   NITRITE NEGATIVE 09/02/2017 1750   LEUKOCYTESUR NEGATIVE 09/02/2017 1750   LEUKOCYTESUR Negative 11/22/2013 1602     RADIOLOGY: Dg Chest 2 View  Result Date: 02/17/2018 CLINICAL DATA:  Shortness of breath. EXAM: CHEST - 2 VIEW COMPARISON:  One-view chest x-ray 09/02/2017. FINDINGS: The heart is mildly enlarged. Aortic atherosclerosis is again seen. Ill-defined left lower lobe airspace disease is present. The right lung is clear. No significant effusions are present. The visualized soft tissues and bony thorax are unremarkable. IMPRESSION: 1. Ill-defined left lower lobe airspace disease raises concern for pneumonia. 2. Borderline cardiomegaly without failure. 3.  Aortic Atherosclerosis (ICD10-I70.0). Electronically Signed   By: Marin Roberts M.D.   On: 02/17/2018 20:17    EKG: Orders placed or performed during the hospital encounter of 02/17/18  . ED EKG  . ED EKG    IMPRESSION AND PLAN:  1.  Acute on chronic respiratory failure with hypoxia, secondary to pneumonia and COPD exacerbation.  We will start broad-spectrum IV antibiotics.  We will continue oxygen therapy, nebulizer treatments and IV steroids. 2.  Sepsis, likely secondary to pneumonia.  See treatment as above, under #1.  We will follow lactic acid level. 3.  Facility acquired pneumonia.  Patient is started on cefepime and vancomycin IV; continue oxygen therapy, nebulizer treatments and IV steroids. 4.  Acute COPD exacerbation, secondary to pneumonia.  See treatment as above, under #1. 5.  Advanced vascular dementia, stable.  Continue to monitor clinically closely. 6.  Diabetes type 2.  Will monitor blood sugars before meals and at bedtime and treat with insulin during the hospital stay.  All the records are reviewed and case discussed with ED provider. Management plans discussed with the patient and he is in  agreement.  CODE STATUS: Code Status History    Date Active Date Inactive Code Status Order ID Comments User Context   07/12/2015 0645 07/14/2015 2009 DNR [id number]  Arnaldo Natal, MD ED   07/12/2015 0627 07/12/2015 0645 Full Code [id number]  Arnaldo Natal, MD ED   11/07/2013 1004 11/08/2013 1420 DNR [id number]  Fredirick Maudlin, MD Inpatient   11/06/2013 0247 11/07/2013 1004 Full Code [id number]  Haydee Monica, MD Inpatient   11/30/2011 0106 11/30/2011 1958 Full Code [id number]  Joya Gaskins, MD ED    Questions for Most Recent Historical Code Status (Order [id number])    Question Answer Comment   In the event of cardiac or respiratory ARREST Do not call a "code blue"    In the event of cardiac or respiratory ARREST Do not perform Intubation, CPR, defibrillation or ACLS    In the event of cardiac or respiratory ARREST Use medication by any route, position, wound care, and other measures to relive pain and suffering. May use oxygen, suction and manual treatment of airway obstruction as needed for comfort.        TOTAL TIME TAKING CARE OF THIS PATIENT: 50 minutes.    Cammy Copa M.D on 02/17/2018 at 9:36 PM  Between 7am to 6pm - Pager - [phone]  After 6pm go to [email or web address] - password [email or web address] [facility]  [facility]  Office  [phone]  CC: Primary care physician; Evangeline Gula, MD

## 2018-02-17 NOTE — Progress Notes (Signed)
Pharmacy Antibiotic Note  [name] is a 84 y.o. male admitted on 02/17/2018 with sepsis.  Pharmacy has been consulted for vanc/cefepime dosing.  Plan: Patient received vanc 1g and cefepime 2g IV x 1  Will continue vanc 1.25g IV q12h w/ 6 hour stack Will draw vanc trough 05/09 @ 1400 prior to 4th dose. Will continue cefepime 2g IV q12h  Ke 0.0641 T1/2 12 hours  Goal trough 15 - 20 mcg/mL  Weight: 200 lb (90.7 kg)  Temp (24hrs), Avg:98.6 F (37 C), Min:98.6 F (37 C), Max:98.6 F (37 C)  Recent Labs  Lab 02/17/18 1927  WBC 10.6  CREATININE 0.92  LATICACIDVEN 4.8*    Estimated Creatinine Clearance: 72 mL/min (by C-G formula based on SCr of 0.92 mg/dL).    Allergies  Allergen Reactions  . Yellow Jacket Venom Anaphylaxis  . Aggrenox [Aspirin-Dipyridamole Er] Other (See Comments)    Reaction: unknown  . Bactrim [Sulfamethoxazole-Trimethoprim] Other (See Comments)    Reaction: unknown  . Betadine Prepstick Plus Other (See Comments)    Reaction: unknown  . Contrast Media [Iodinated Diagnostic Agents] Other (See Comments)    Reaction: unknown. Patient states that he does not remember the reaction, but it happened at the [place]  . Cortisone Other (See Comments)    Reaction: unknown  . Penicillins Other (See Comments)    Reaction: unknown Has patient had a PCN reaction causing immediate rash, facial/tongue/throat swelling, SOB or lightheadedness with hypotension: Unknown Has patient had a PCN reaction causing severe rash involving mucus membranes or skin necrosis: Unknown Has patient had a PCN reaction that required hospitalization: Unknown Has patient had a PCN reaction occurring within the last 10 years: Unknown If all of the above answers are "NO", then may proceed with Cephalosporin use.  . Salsalate Other (See Comments)    Reaction: unknown  . Sulfa Antibiotics Other (See Comments)    Reaction: unknown  . Tegretol [Carbamazepine] Other (See Comments)    Reaction:  unknown   Thank you for allowing pharmacy to be a part of this patient's care.  Thomasene Ripple, PharmD, BCPS Clinical Pharmacist 02/17/2018

## 2018-02-17 NOTE — ED Notes (Signed)
Spoke with Caryn Bee, MD about non-rebreather on pt. Per MD pt to stay on and MD ordering Cpap for nighttime.

## 2018-02-17 NOTE — ED Triage Notes (Signed)
Pt arrived via EMS from [facility] with staff report of pt having breathing difficulty and an O2 of 86% on regular 3L. Pt clammy to the touch, O2 is 91 on non rebreather. Pt denies pain and sts, "I don't need to be here, aint nothing wrong with me." Pt baseline dementia.

## 2018-02-17 NOTE — ED Provider Notes (Addendum)
[facility] Emergency Department Provider Note    First MD Initiated Contact with Patient 02/17/18 1944     (approximate)  I have reviewed the triage vital signs and the nursing notes.   HISTORY  Chief Complaint Shortness of Breath  Level V Caveat:  Dementia   HPI [name] is a 84 y.o. male presents with shortness of breath.  Patient with underlying dementia and family member states the patient has a history of hypoxia secondary to COPD.  No report of any fevers but does report a productive cough.  Patient found to be hypoxic on 6 L to 86%.  Does wear 3 L nasal cannula.  No additional history able to be provided by patient at this time.  Past Medical History:  Diagnosis Date  . COPD (chronic obstructive pulmonary disease) (HCC)   . CVA (cerebral infarction)   . Diabetes mellitus   . GI bleed   . Hypertension   . MDD (major depressive disorder)    w/ paranoia  . PTSD (post-traumatic stress disorder)   . Vascular dementia    History reviewed. No pertinent family history. Past Surgical History:  Procedure Laterality Date  . TOTAL HIP ARTHROPLASTY Right    Patient Active Problem List   Diagnosis Date Noted  . Sepsis (HCC) 02/17/2018  . Hypoxia 07/12/2015  . Pressure ulcer 07/12/2015  . Fall at nursing home 11/06/2013  . Hip fracture, right (HCC) 11/06/2013  . Vascular dementia   . Hypertension   . Muscle weakness (generalized) 06/11/2011  . Difficulty in walking(719.7) 06/11/2011      Prior to Admission medications   Medication Sig Start Date End Date Taking? Authorizing Provider  acetaminophen (TYLENOL) 325 MG tablet Take 650 mg by mouth every 8 (eight) hours.    Yes [provider]  albuterol (PROVENTIL HFA;VENTOLIN HFA) 108 (90 Base) MCG/ACT inhaler Inhale 1 puff into the lungs every 6 (six) hours as needed for shortness of breath.    Yes [provider]  ARIPiprazole (ABILIFY) 5 MG tablet Take 5 mg by mouth  daily.   Yes [provider]  aspirin EC 81 MG tablet Take 81 mg by mouth daily.   Yes [provider]  atorvastatin (LIPITOR) 40 MG tablet Take 40 mg by mouth at bedtime.    Yes [provider]  carbamide peroxide (DEBROX) 6.5 % otic solution Place 5 drops into both ears 2 (two) times daily as needed (cerum impaction).    Yes [provider]  Emollient Juanell Fairly) LOTN Apply 1 application topically daily. Apply to face   Yes [provider]  gabapentin (NEURONTIN) 300 MG capsule Take 300 mg by mouth 2 (two) times daily.   Yes [provider]  ketoconazole (NIZORAL) 2 % shampoo Apply 1 application topically as directed. Apply to wet scalp and face twice weekly as needed for dandruff/ flakey skin   Yes [provider]  magnesium oxide (MAG-OX) 400 (241.3 MG) MG tablet Take 2 tablets (800 mg total) by mouth daily. Patient taking differently: Take 800 mg by mouth 3 (three) times daily.  11/07/13  Yes Kari Baars, MD  Menthol-Methyl Salicylate [facility] GREASELESS EX) Apply 1 application topically as needed (pain).   Yes [provider]  metFORMIN (GLUCOPHAGE) 1000 MG tablet Take 1,000 mg by mouth 2 (two) times daily with a meal.    Yes [provider]  Multiple Vitamin (MULTIVITAMIN WITH MINERALS) TABS tablet Take 1 tablet by mouth daily.  Yes [provider]  nitroGLYCERIN (NITROSTAT) 0.4 MG SL tablet Place 1 tablet (0.4 mg total) under the tongue every 5 (five) minutes as needed for chest pain. 11/07/13  Yes Kari Baars, MD  pantoprazole (PROTONIX) 40 MG tablet Take 1 tablet (40 mg total) by mouth daily. 11/07/13  Yes Kari Baars, MD  polyethylene glycol [facility] Ethelene Hal) packet Take 17 g by mouth 2 (two) times daily as needed for mild constipation.    Yes [provider]  sennosides-docusate sodium (SENOKOT-S) 8.6-50 MG tablet Take 1 tablet by mouth 2 (two) times daily.    Yes [provider]  tamsulosin (FLOMAX) 0.4 MG CAPS capsule Take 0.4 mg by mouth at bedtime.    Yes [provider]  umeclidinium bromide (INCRUSE ELLIPTA) 62.5 MCG/INH AEPB Inhale 1 puff into the lungs daily.   Yes [provider]  venlafaxine XR (EFFEXOR-XR) 150 MG 24 hr capsule Take 1 capsule (150 mg total) by mouth daily with breakfast. 11/07/13  Yes Kari Baars, MD  white petrolatum (VASELINE) GEL Apply 1 application topically 3 (three) times daily as needed (nose bleeds). Apply to bilateral nares   Yes [provider]  docusate sodium 100 MG CAPS Take 100 mg by mouth daily. Patient not taking: Reported on 09/02/2017 11/07/13   Kari Baars, MD  insulin aspart (NOVOLOG) 100 UNIT/ML injection Inject 0-9 Units into the skin 3 (three) times daily with meals. Patient not taking: Reported on 09/02/2017 11/07/13   Kari Baars, MD  insulin aspart (NOVOLOG) 100 UNIT/ML injection Inject 0-5 Units into the skin at bedtime. Patient not taking: Reported on 09/02/2017 11/07/13   Kari Baars, MD  predniSONE (STERAPRED UNI-PAK 21 TAB) 10 MG (21) TBPK tablet Take 1 tablet (10 mg total) by mouth daily. Take 6 tablets by mouth for 1 day followed by  5 tablets by mouth for 1 day followed by  4 tablets by mouth for 1 day followed by  3 tablets by mouth for 1 day followed by  2 tablets by mouth for 1 day followed by  1 tablet by mouth for a day and stop Patient not taking: Reported on 09/02/2017 07/14/15   Ramonita Lab, MD  theophylline (THEODUR) 200 MG 12 hr tablet Take 1 tablet (200 mg total) by mouth every morning. Patient not taking: Reported on 09/02/2017 07/14/15   Ramonita Lab, MD  tiotropium (SPIRIVA) 18 MCG inhalation capsule Place 1 capsule (18 mcg total) into inhaler and inhale daily. Patient not taking: Reported on 09/02/2017 07/14/15   Ramonita Lab, MD    Allergies Yellow jacket venom; Aggrenox [aspirin-dipyridamole er]; Bactrim [sulfamethoxazole-trimethoprim];  Betadine prepstick plus; Contrast media [iodinated diagnostic agents]; Cortisone; Penicillins; Salsalate; Sulfa antibiotics; and Tegretol [carbamazepine]    Social History Social History   Tobacco Use  . Smoking status: Never Smoker  . Smokeless tobacco: Never Used  Substance Use Topics  . Alcohol use: No  . Drug use: No    Review of Systems Patient denies headaches, rhinorrhea, blurry vision, numbness, shortness of breath, chest pain, edema, cough, abdominal pain, nausea, vomiting, diarrhea, dysuria, fevers, rashes or hallucinations unless otherwise stated above in HPI. ____________________________________________   PHYSICAL EXAM:  VITAL SIGNS: Vitals:   02/17/18 2200 02/17/18 2300  BP: (!) 102/55 113/65  Pulse: 88 70  Resp: (!) 22 17  Temp:  (!) 97.4 F (36.3 C)  SpO2: 95% 100%    Constitutional: Alert and oriented. Ill appearing in moderate resp distress Eyes: Conjunctivae are normal.  Head: Atraumatic.  Nose: No congestion/rhinnorhea. Mouth/Throat: Mucous membranes are moist.   Neck: No stridor. Painless ROM.  Cardiovascular: Normal rate, regular rhythm. Grossly normal heart sounds.  Good peripheral circulation. Respiratory: tachypnea with diffuse wheeze but right posterior rhonchi Gastrointestinal: Soft and nontender. No distention. No abdominal bruits. No CVA tenderness. Genitourinary: deferred Musculoskeletal: No lower extremity tenderness nor edema.  No joint effusions. Neurologic:   No gross focal neurologic deficits are appreciated. No facial droop Skin:  Skin is warm, dry and intact. No rash noted. Psychiatric: Mood and affect are normal. Speech and behavior are normal.  ____________________________________________   LABS (all labs ordered are listed, but only abnormal results are displayed)  Results for orders placed or performed during the hospital encounter of 02/17/18 (from the past 24 hour(s))  Comprehensive metabolic panel     Status: Abnormal    Collection Time: 02/17/18  7:27 PM  Result Value Ref Range   Sodium 135 135 - 145 mmol/L   Potassium 5.0 3.5 - 5.1 mmol/L   Chloride 101 101 - 111 mmol/L   CO2 21 (L) 22 - 32 mmol/L   Glucose, Bld 105 (H) 65 - 99 mg/dL   BUN 27 (H) 6 - 20 mg/dL   Creatinine, Ser 1.61 0.61 - 1.24 mg/dL   Calcium 9.3 8.9 - 09.6 mg/dL   Total Protein 7.0 6.5 - 8.1 g/dL   Albumin 3.9 3.5 - 5.0 g/dL   AST 32 15 - 41 U/L   ALT 16 (L) 17 - 63 U/L   Alkaline Phosphatase 60 38 - 126 U/L   Total Bilirubin 0.5 0.3 - 1.2 mg/dL   GFR calc non Af Amer >60 >60 mL/min   GFR calc Af Amer >60 >60 mL/min   Anion gap 13 5 - 15  Lactic acid, plasma     Status: Abnormal   Collection Time: 02/17/18  7:27 PM  Result Value Ref Range   Lactic Acid, Venous 4.8 (HH) 0.5 - 1.9 mmol/L  CBC with Differential     Status: Abnormal   Collection Time: 02/17/18  7:27 PM  Result Value Ref Range   WBC 10.6 3.8 - 10.6 K/uL   RBC 3.77 (L) 4.40 - 5.90 MIL/uL   Hemoglobin 11.2 (L) 13.0 - 18.0 g/dL   HCT 04.5 (L) 40.9 - 81.1 %   MCV 91.2 80.0 - 100.0 fL   MCH 29.8 26.0 - 34.0 pg   MCHC 32.7 32.0 - 36.0 g/dL   RDW 91.4 (H) 78.2 - 95.6 %   Platelets 94 (L) 150 - 440 K/uL   Neutrophils Relative % 71 %   Neutro Abs 7.5 (H) 1.4 - 6.5 K/uL   Lymphocytes Relative 23 %   Lymphs Abs 2.5 1.0 - 3.6 K/uL   Monocytes Relative 5 %   Monocytes Absolute 0.5 0.2 - 1.0 K/uL   Eosinophils Relative 1 %   Eosinophils Absolute 0.1 0 - 0.7 K/uL   Basophils Relative 0 %   Basophils Absolute 0.0 0 - 0.1 K/uL  Protime-INR     Status: None   Collection Time: 02/17/18  7:27 PM  Result Value Ref Range   Prothrombin Time 13.9 11.4 - 15.2 seconds   INR 1.08   Troponin I     Status: None   Collection Time: 02/17/18  7:27 PM  Result Value Ref Range   Troponin I <0.03 <0.03 ng/mL  Blood gas, venous     Status: Abnormal   Collection Time: 02/17/18  8:26 PM  Result Value  Ref Range   pH, Ven 7.37 7.250 - 7.430   pCO2, Ven 43 (L) 44.0 - 60.0 mmHg   pO2,  Ven 62.0 (H) 32.0 - 45.0 mmHg   Bicarbonate 24.9 20.0 - 28.0 mmol/L   Acid-base deficit 0.5 0.0 - 2.0 mmol/L   O2 Saturation 90.6 %   Patient temperature 37.0    Collection site VENOUS    Sample type VENOUS   Lactic acid, plasma     Status: Abnormal   Collection Time: 02/17/18 10:27 PM  Result Value Ref Range   Lactic Acid, Venous 4.2 (HH) 0.5 - 1.9 mmol/L  Glucose, capillary     Status: Abnormal   Collection Time: 02/17/18 11:08 PM  Result Value Ref Range   Glucose-Capillary 150 (H) 65 - 99 mg/dL   ____________________________________________  EKG My review and personal interpretation at Time: 19:32   Indication: sob  Rate: 85  Rhythm: sinus Axis: normal Other: prolonged pr, no stemi ____________________________________________  RADIOLOGY  I personally reviewed all radiographic images ordered to evaluate for the above acute complaints and reviewed radiology reports and findings.  These findings were personally discussed with the patient.  Please see medical record for radiology report.  ____________________________________________   PROCEDURES  Procedure(s) performed:  .Critical Care Performed by: Willy Eddy, MD Authorized by: Willy Eddy, MD   Critical care provider statement:    Critical care time (minutes):  5   Critical care time was exclusive of:  Separately billable procedures and treating other patients   Critical care was necessary to treat or prevent imminent or life-threatening deterioration of the following conditions:  Respiratory failure   Critical care was time spent personally by me on the following activities:  Development of treatment plan with patient or surrogate, discussions with consultants, evaluation of patient's response to treatment, examination of patient, obtaining history from patient or surrogate, ordering and performing treatments and interventions, ordering and review of laboratory studies, ordering and review of radiographic  studies, pulse oximetry, re-evaluation of patient's condition and review of old charts      Critical Care performed: yes ____________________________________________   INITIAL IMPRESSION / ASSESSMENT AND PLAN / ED COURSE  Pertinent labs & imaging results that were available during my care of the patient were reviewed by me and considered in my medical decision making (see chart for details).  DDX: pna, chf, copd, uti, sepsis, ards  [name] is a 84 y.o. who presents to the ED with acute respiratory distress test described above.  Patient without fever but does have significantly elevated lactate.  Will call code sepsis.  Will start on broad-spectrum antibiotics.  Will give nebulizer treatments.  Patient with family and reiterates DNR but they are agreeable to medical management without escalation of care at this time.  The patient will be placed on continuous pulse oximetry and telemetry for monitoring.  Laboratory evaluation will be sent to evaluate for the above complaints.     Clinical Course as of Feb 18 2328  Tue Feb 17, 2018  2111 Given the patient's acute respiratory failure with hypoxia with lactic acidosis will initiate broad-spectrum antibiotics for pneumonia.  Likely multifactorial given underlying COPD and lactate may be worsened by his underlying respiratory effort.  Will give gentle IV hydration as he is not hypotensive at this time and I do believe the patient can receive his for IV fluid resuscitation over the next several hours so as not to worsen respiratory status as the patient is a DNR and I  do not want to volume overload him.  Have discussed with the patient and available family all diagnostics and treatments performed thus far and all questions were answered to the best of my ability. The patient demonstrates understanding and agreement with plan.    [PR]    Clinical Course User Index [PR] Willy Eddy, MD     As part of my medical decision making, I  reviewed the following data within the electronic MEDICAL RECORD NUMBER Nursing notes reviewed and incorporated, Labs reviewed, notes from prior ED visits and [place] Controlled Substance Database   ____________________________________________   FINAL CLINICAL IMPRESSION(S) / ED DIAGNOSES  Final diagnoses:  Acute respiratory failure with hypoxia (HCC)  COPD exacerbation (HCC)  Elevated lactic acid level      NEW MEDICATIONS STARTED DURING THIS VISIT:  Current Discharge Medication List       Note:  This document was prepared using Dragon voice recognition software and may include unintentional dictation errors.    Willy Eddy, MD 02/17/18 1610    Willy Eddy, MD 02/25/18 [phone]

## 2018-02-17 NOTE — Progress Notes (Signed)
CODE SEPSIS - PHARMACY COMMUNICATION  **Broad Spectrum Antibiotics should be administered within 1 hour of Sepsis diagnosis**  Time Code Sepsis Called/Page Received: 1951  Antibiotics Ordered: cefepime/vanc  Time of 1st antibiotic administration:   Additional action taken by pharmacy: called MD for abx orders      Olene Floss ,PharmD Clinical Pharmacist  02/17/2018  8:00 PM

## 2018-02-17 NOTE — ED Notes (Signed)
Erie Noe RN aware of bed assigned

## 2018-02-18 LAB — CBC WITH DIFFERENTIAL/PLATELET
BASOS ABS: 0 10*3/uL (ref 0–0.1)
BASOS PCT: 0 %
EOS ABS: 0 10*3/uL (ref 0–0.7)
EOS PCT: 0 %
HCT: 34.9 % — ABNORMAL LOW (ref 40.0–52.0)
Hemoglobin: 11.3 g/dL — ABNORMAL LOW (ref 13.0–18.0)
Lymphocytes Relative: 5 %
Lymphs Abs: 0.4 10*3/uL — ABNORMAL LOW (ref 1.0–3.6)
MCH: 29.8 pg (ref 26.0–34.0)
MCHC: 32.5 g/dL (ref 32.0–36.0)
MCV: 91.9 fL (ref 80.0–100.0)
MONO ABS: 0.1 10*3/uL — AB (ref 0.2–1.0)
Monocytes Relative: 1 %
Neutro Abs: 6.9 10*3/uL — ABNORMAL HIGH (ref 1.4–6.5)
Neutrophils Relative %: 94 %
PLATELETS: 88 10*3/uL — AB (ref 150–440)
RBC: 3.8 MIL/uL — AB (ref 4.40–5.90)
RDW: 17.8 % — AB (ref 11.5–14.5)
WBC: 7.4 10*3/uL (ref 3.8–10.6)

## 2018-02-18 LAB — BLOOD CULTURE ID PANEL (REFLEXED)
Acinetobacter baumannii: NOT DETECTED
CANDIDA TROPICALIS: NOT DETECTED
Candida albicans: NOT DETECTED
Candida glabrata: NOT DETECTED
Candida krusei: NOT DETECTED
Candida parapsilosis: NOT DETECTED
ENTEROCOCCUS SPECIES: NOT DETECTED
Enterobacter cloacae complex: NOT DETECTED
Enterobacteriaceae species: NOT DETECTED
Escherichia coli: NOT DETECTED
HAEMOPHILUS INFLUENZAE: NOT DETECTED
KLEBSIELLA PNEUMONIAE: NOT DETECTED
Klebsiella oxytoca: NOT DETECTED
LISTERIA MONOCYTOGENES: NOT DETECTED
METHICILLIN RESISTANCE: DETECTED — AB
NEISSERIA MENINGITIDIS: NOT DETECTED
PROTEUS SPECIES: NOT DETECTED
Pseudomonas aeruginosa: NOT DETECTED
SERRATIA MARCESCENS: NOT DETECTED
STAPHYLOCOCCUS AUREUS BCID: NOT DETECTED
STREPTOCOCCUS SPECIES: NOT DETECTED
Staphylococcus species: DETECTED — AB
Streptococcus agalactiae: NOT DETECTED
Streptococcus pneumoniae: NOT DETECTED
Streptococcus pyogenes: NOT DETECTED

## 2018-02-18 LAB — COMPREHENSIVE METABOLIC PANEL
ALBUMIN: 4.1 g/dL (ref 3.5–5.0)
ALT: 15 U/L — ABNORMAL LOW (ref 17–63)
AST: 26 U/L (ref 15–41)
Alkaline Phosphatase: 67 U/L (ref 38–126)
Anion gap: 10 (ref 5–15)
BILIRUBIN TOTAL: 0.6 mg/dL (ref 0.3–1.2)
BUN: 30 mg/dL — AB (ref 6–20)
CALCIUM: 9.2 mg/dL (ref 8.9–10.3)
CO2: 26 mmol/L (ref 22–32)
Chloride: 100 mmol/L — ABNORMAL LOW (ref 101–111)
Creatinine, Ser: 1.01 mg/dL (ref 0.61–1.24)
GFR calc Af Amer: >60 mL/min (ref >60)
GFR calc non Af Amer: >60 mL/min (ref >60)
GLUCOSE: 185 mg/dL — AB (ref 65–99)
Potassium: 4.8 mmol/L (ref 3.5–5.1)
SODIUM: 136 mmol/L (ref 135–145)
TOTAL PROTEIN: 7.3 g/dL (ref 6.5–8.1)

## 2018-02-18 LAB — MRSA PCR SCREENING: MRSA by PCR: NEGATIVE

## 2018-02-18 LAB — LACTIC ACID, PLASMA
LACTIC ACID, VENOUS: 3.6 mmol/L — AB (ref 0.5–1.9)
Lactic Acid, Venous: 3.9 mmol/L (ref 0.5–1.9)
Lactic Acid, Venous: 2.4 mmol/L (ref 0.5–1.9)
Lactic Acid, Venous: 4.7 mmol/L (ref 0.5–1.9)
Lactic Acid, Venous: 3.6 mmol/L (ref 0.5–1.9)

## 2018-02-18 LAB — GLUCOSE, CAPILLARY
GLUCOSE-CAPILLARY: 295 mg/dL — AB (ref 65–99)
Glucose-Capillary: 207 mg/dL — ABNORMAL HIGH (ref 65–99)
Glucose-Capillary: 222 mg/dL — ABNORMAL HIGH (ref 65–99)
Glucose-Capillary: 160 mg/dL — ABNORMAL HIGH (ref 65–99)

## 2018-02-18 MED ORDER — IPRATROPIUM-ALBUTEROL 0.5-2.5 (3) MG/3ML IN SOLN
3.0000 mL | RESPIRATORY_TRACT | Status: DC
  Administered 2018-02-19 (×2): 3 mL via RESPIRATORY_TRACT
  Filled 2018-02-18 (×2): qty 3

## 2018-02-18 MED ORDER — NYSTATIN 100000 UNIT/GM EX POWD
Freq: Three times a day (TID) | CUTANEOUS | Status: DC
  Administered 2018-02-18 – 2018-02-20 (×6): via TOPICAL
  Filled 2018-02-18: qty 15

## 2018-02-18 MED ORDER — PREMIER PROTEIN SHAKE
11.0000 [oz_av] | ORAL | Status: DC
  Administered 2018-02-19: 11 [oz_av] via ORAL

## 2018-02-18 MED ORDER — IPRATROPIUM-ALBUTEROL 0.5-2.5 (3) MG/3ML IN SOLN: 3.0000 mL | Freq: Three times a day (TID) | RESPIRATORY_TRACT | Status: DC

## 2018-02-18 NOTE — Progress Notes (Signed)
PHARMACY - PHYSICIAN COMMUNICATION CRITICAL VALUE ALERT - BLOOD CULTURE IDENTIFICATION (BCID)  Results for orders placed or performed during the hospital encounter of 02/17/18  Blood Culture ID Panel (Reflexed) (Collected: 02/17/2018  7:32 PM)  Result Value Ref Range   Enterococcus species NOT DETECTED NOT DETECTED   Listeria monocytogenes NOT DETECTED NOT DETECTED   Staphylococcus species DETECTED (A) NOT DETECTED   Staphylococcus aureus NOT DETECTED NOT DETECTED   Methicillin resistance DETECTED (A) NOT DETECTED   Streptococcus species NOT DETECTED NOT DETECTED   Streptococcus agalactiae NOT DETECTED NOT DETECTED   Streptococcus pneumoniae NOT DETECTED NOT DETECTED   Streptococcus pyogenes NOT DETECTED NOT DETECTED   Acinetobacter baumannii NOT DETECTED NOT DETECTED   Enterobacteriaceae species NOT DETECTED NOT DETECTED   Enterobacter cloacae complex NOT DETECTED NOT DETECTED   Escherichia coli NOT DETECTED NOT DETECTED   Klebsiella oxytoca NOT DETECTED NOT DETECTED   Klebsiella pneumoniae NOT DETECTED NOT DETECTED   Proteus species NOT DETECTED NOT DETECTED   Serratia marcescens NOT DETECTED NOT DETECTED   Haemophilus influenzae NOT DETECTED NOT DETECTED   Neisseria meningitidis NOT DETECTED NOT DETECTED   Pseudomonas aeruginosa NOT DETECTED NOT DETECTED   Candida albicans NOT DETECTED NOT DETECTED   Candida glabrata NOT DETECTED NOT DETECTED   Candida krusei NOT DETECTED NOT DETECTED   Candida parapsilosis NOT DETECTED NOT DETECTED   Candida tropicalis NOT DETECTED NOT DETECTED    Name of physician (or Provider) Contacted:  Vachanni   Changes to prescribed antibiotics required:  No, continue pt on vancomycin.   Aidon Klemens D 02/18/2018  7:28 PM

## 2018-02-18 NOTE — Progress Notes (Signed)
Lab called with critical lab value of 4.7 lactic acid. MD made aware through text page. Awaiting call back.

## 2018-02-18 NOTE — NC FL2 (Signed)
[place] MEDICAID FL2 LEVEL OF CARE SCREENING TOOL     IDENTIFICATION  Patient Name: [name] Birthdate: [birth year] Sex: male Admission Date (Current Location): 02/17/2018  [place] and [place] Number:  Chiropodist and Address:  [facility], [address], [place], [place] [address]      Provider Number: [id number]  Attending Physician Name and Address:  Ramonita Lab, MD  Relative Name and Phone Number:       Current Level of Care: Hospital Recommended Level of Care: Skilled Nursing Facility Prior Approval Number:    Date Approved/Denied:   PASRR Number: [id number])  Discharge Plan: SNF    Current Diagnoses: Patient Active Problem List   Diagnosis Date Noted  . Sepsis (HCC) 02/17/2018  . Hypoxia 07/12/2015  . Pressure ulcer 07/12/2015  . Fall at nursing home 11/06/2013  . Hip fracture, right (HCC) 11/06/2013  . Vascular dementia   . Hypertension   . Muscle weakness (generalized) 06/11/2011  . Difficulty in walking(719.7) 06/11/2011    Orientation RESPIRATION BLADDER Height & Weight     Self, Place  O2(4 Liters Oxygen. ) Continent Weight: 186 lb 1.1 oz (84.4 kg) Height:   (188 cm)  BEHAVIORAL SYMPTOMS/MOOD NEUROLOGICAL BOWEL NUTRITION STATUS      Continent Diet(Diet: Heart Healthy/ Carb Modified. )  AMBULATORY STATUS COMMUNICATION OF NEEDS Skin   Extensive Assist Verbally Normal                       Personal Care Assistance Level of Assistance  Bathing, Feeding, Dressing Bathing Assistance: Limited assistance Feeding assistance: Independent Dressing Assistance: Limited assistance     Functional Limitations Info  Sight, Hearing, Speech Sight Info: Adequate Hearing Info: Adequate Speech Info: Adequate    SPECIAL CARE FACTORS FREQUENCY                       Contractures      Additional Factors Info  Code Status, Allergies Code Status Info: (DNR) Allergies Info: (Yellow Jacket Venom,  Aggrenox Aspirin-dipyridamole Er, Bactrim Sulfamethoxazole-trimethoprim, Betadine Prepstick Plus, Contrast Media Iodinated Diagnostic Agents, Cortisone, Penicillins, Salsalate, Sulfa Antibiotics, Tegretol Carbamazepine)           Current Medications (02/18/2018):  This is the current hospital active medication list Current Facility-Administered Medications  Medication Dose Route Frequency Provider Last Rate Last Dose  . 0.9 %  sodium chloride infusion   Intravenous Once Willy Eddy, MD      . 0.9 %  sodium chloride infusion   Intravenous Continuous Cammy Copa, MD 75 mL/hr at 02/18/18 0023    . acetaminophen (TYLENOL) tablet 650 mg  650 mg Oral Q6H PRN Cammy Copa, MD       Or  . acetaminophen (TYLENOL) suppository 650 mg  650 mg Rectal Q6H PRN Cammy Copa, MD      . ARIPiprazole (ABILIFY) tablet 7.5 mg  7.5 mg Oral Daily Cammy Copa, MD   7.5 mg at 02/18/18 0845  . aspirin chewable tablet 81 mg  81 mg Oral Daily Cammy Copa, MD   81 mg at 02/18/18 0845  . atorvastatin (LIPITOR) tablet 20 mg  20 mg Oral QHS Cammy Copa, MD   20 mg at 02/18/18 0022  . bisacodyl (DULCOLAX) EC tablet 5 mg  5 mg Oral Daily PRN Cammy Copa, MD      . ceFEPIme (MAXIPIME) 2 g in sodium chloride 0.9 % 100 mL IVPB  2 g Intravenous  Q12H Cammy Copa, MD 200 mL/hr at 02/18/18 0846 2 g at 02/18/18 0846  . docusate sodium (COLACE) capsule 100 mg  100 mg Oral BID Cammy Copa, MD   100 mg at 02/18/18 0846  . fluticasone furoate-vilanterol (BREO ELLIPTA) 200-25 MCG/INH 1 puff  1 puff Inhalation Daily Cammy Copa, MD   1 puff at 02/18/18 0846  . gabapentin (NEURONTIN) capsule 300 mg  300 mg Oral BID Cammy Copa, MD   300 mg at 02/18/18 0845  . heparin injection 5,000 Units  5,000 Units Subcutaneous Q8H Cammy Copa, MD   5,000 Units at 02/18/18 [phone]  . HYDROcodone-acetaminophen (NORCO/VICODIN) 5-325 MG per tablet 1-2 tablet  1-2 tablet Oral Q4H PRN Cammy Copa, MD      . insulin aspart (novoLOG)  injection 0-5 Units  0-5 Units Subcutaneous QHS Cammy Copa, MD      . insulin aspart (novoLOG) injection 0-9 Units  0-9 Units Subcutaneous TID WC Cammy Copa, MD   3 Units at 02/18/18 [phone]  . ipratropium-albuterol (DUONEB) 0.5-2.5 (3) MG/3ML nebulizer solution 3 mL  3 mL Nebulization Q6H Cammy Copa, MD   3 mL at 02/18/18 7829  . magnesium oxide (MAG-OX) tablet 800 mg  800 mg Oral Daily Cammy Copa, MD   800 mg at 02/18/18 0845  . methylPREDNISolone sodium succinate (SOLU-MEDROL) 125 mg/2 mL injection 80 mg  80 mg Intravenous Q12H Cammy Copa, MD   80 mg at 02/18/18 0843  . multivitamin with minerals tablet 1 tablet  1 tablet Oral Daily Cammy Copa, MD   1 tablet at 02/18/18 0845  . nitroGLYCERIN (NITROSTAT) SL tablet 0.4 mg  0.4 mg Sublingual Q5 min PRN Cammy Copa, MD      . ondansetron [facility]) tablet 4 mg  4 mg Oral Q6H PRN Cammy Copa, MD       Or  . ondansetron [facility]) injection 4 mg  4 mg Intravenous Q6H PRN Cammy Copa, MD      . pantoprazole (PROTONIX) EC tablet 40 mg  40 mg Oral Daily Cammy Copa, MD   40 mg at 02/18/18 0846  . polyethylene glycol (MIRALAX / GLYCOLAX) packet 17 g  17 g Oral Daily Cammy Copa, MD      . tamsulosin [facility]) capsule 0.4 mg  0.4 mg Oral Daily Cammy Copa, MD   0.4 mg at 02/18/18 0845  . traZODone (DESYREL) tablet 25 mg  25 mg Oral QHS PRN Cammy Copa, MD      . vancomycin (VANCOCIN) 1,250 mg in sodium chloride 0.9 % 250 mL IVPB  1,250 mg Intravenous Q12H Cammy Copa, MD   Stopped at 02/18/18 [phone]  . venlafaxine XR (EFFEXOR-XR) 24 hr capsule 150 mg  150 mg Oral Q breakfast Cammy Copa, MD   150 mg at 02/18/18 0845     Discharge Medications: Please see discharge summary for a list of discharge medications.  Relevant Imaging Results:  Relevant Lab Results:   Additional Information (SSN: [id number])  Keia Rask, Darleen Crocker, LCSW

## 2018-02-18 NOTE — Progress Notes (Signed)
Initial Nutrition Assessment  DOCUMENTATION CODES:   Not applicable  INTERVENTION:  Will downgrade diet to dysphagia 3 (mechanical soft) with thin liquids.  Provide Premier Protein po once daily, each supplement provides 160 kcal and 30 grams of protein.  Provide Magic cup BID with lunch and dinner, each supplement provides 290 kcal and 9 grams of protein.  Continue daily MVI.  NUTRITION DIAGNOSIS:   Increased nutrient needs related to catabolic illness(COPD) as evidenced by estimated needs.  GOAL:   Patient will meet greater than or equal to 90% of their needs  MONITOR:   PO intake, Supplement acceptance, Labs, Weight trends, I & O's  REASON FOR ASSESSMENT:   Malnutrition Screening Tool    ASSESSMENT:   84 year old male with PMHx of DM type 2, hx CVA, HTN, PTSD, vascular dementia, MDD, COPD who is now admitted from [facility] with acute on chronic respiratory failure with hypoxia secondary to PNA and acute exacerbation of COPD.   Met with patient at bedside. Even with history of dementia, he seems to provide a good nutrition/weight history. He reports that a few years ago he had a decreased appetite. He was not eating well at meals at that time and lost weight. He reports he feels like his appetite is good now but he has been unable to gain back to his UBW (though he is gaining back per his report). He is eating well here. Noted he finished 75% of breakfast this morning and he had almost finished lunch tray during RD visit. He typically has his food chopped (noted diet order is mechanical soft at [facility]). He also receives [employer] with meals.   UBW 200 lbs. He reports he lost weight down to 150 lbs at one point. Limited weight history in chart but did not he was 197.9 lbs on 11/06/2013 and 167.5 lbs on 07/14/2015. That is a weight loss of 30.4 lbs (15.4% body weight) over >1 year, which is not significant for time frame. He has since been gaining back weight.  Meal  Completion: 75% of breakfast this morning  Medications reviewed and include: Colace, Novolog 0-9 units TID, Novolog 0-5 units QHS, magnesium oxide 800 mg daily, Solu-Medrol 80 mg Q12hrs IV, MVI daily, pantoprazole, Miralax, Flomax, NS @ 100 mL/hr, cefepime, vancomycin.  Labs reviewed: CBG 150-222, Chloride 100, BUN 30.  Patient does not meet criteria for malnutrition at this time.  Discussed with RN.  NUTRITION - FOCUSED PHYSICAL EXAM:    Most Recent Value  Orbital Region  No depletion  Upper Arm Region  Mild depletion  Thoracic and Lumbar Region  No depletion  Buccal Region  No depletion  Temple Region  Mild depletion  Clavicle Bone Region  Moderate depletion  Clavicle and Acromion Bone Region  Moderate depletion  Scapular Bone Region  Mild depletion  Dorsal Hand  Mild depletion  Patellar Region  Moderate depletion  Anterior Thigh Region  Moderate depletion  Posterior Calf Region  Moderate depletion  Edema (RD Assessment)  None  Hair  Reviewed  Eyes  Reviewed  Mouth  Reviewed [missing bottom dentures]  Skin  Reviewed [ecchymosis]  Nails  Reviewed     Diet Order:   Diet Order           Diet heart healthy/carb modified Room service appropriate? Yes; Fluid consistency: Thin  Diet effective now          EDUCATION NEEDS:   No education needs have been identified at this time  Skin:  Skin Assessment: Reviewed RN Assessment  Last BM:  Unknown  Height:   Ht Readings from Last 1 Encounters:  02/17/18 6' 2"  (1.88 m)    Weight:   Wt Readings from Last 1 Encounters:  02/18/18 186 lb 1.1 oz (84.4 kg)    Ideal Body Weight:  86.4 kg  BMI:  Body mass index is 23.89 kg/m.  Estimated Nutritional Needs:   Kcal:  1945-2100 (MSJ x 1.2-1.3)  Protein:  85-100 grams (1-1.2 grams/kg)  Fluid:  1.9 L/day  Willey Blade, MS, RD, LDN Office: [phone] Pager: [phone] After Hours/Weekend Pager: [phone]

## 2018-02-18 NOTE — Clinical Social Work Note (Signed)
Clinical Social Work Assessment  Patient Details  Name: [name] MRN: [id number] Date of Birth: January 09, 1933  Date of referral:  02/18/18               Reason for consult:  Other (Comment Required)(From [facility] under [employer] )                Permission sought to share information with:  Chartered certified accountant granted to share information::  Yes, Verbal Permission Granted  Name::      [facility]   Agency::     Relationship::     Contact Information:     Housing/Transportation Living arrangements for the past 2 months:  [place] of Information:  Patient, Adult Children, Facility Patient Interpreter Needed:  None Criminal Activity/Legal Involvement Pertinent to Current Situation/Hospitalization:  No - Comment as needed Significant Relationships:  Adult Children Lives with:  Facility Resident Do you feel safe going back to the place where you live?  Yes Need for family participation in patient care:  Yes (Comment)  Care giving concerns: Patient is a long term care resident at [facility] under a [place] contract.    Social Worker assessment / plan: Holiday representative (CSW) reviewed chart and noted that patient is from [facility]. Per [name] admissions coordinator at [facility] patient is a long term care resident under a [place] and can return to the facility when stable. CSW met with patient and his son [name] was at bedside. Patient was sitting up in the bed alert and oriented X3 and was eating breakfast. CSW introduced self and explained role of CSW department. Patient reported that he has been at [facility] for 4 years and goes to the [facility] for care. Per patient he is on chronic oxygen 3 liters. Per son [name] him and his sister [name] live in [place]. Per [name], he is second on the HPOA and patient's daughter [name] is the first person listed on HPOA. Patient and son  are agreeable for patient to return to [facility]. FL2 complete. CSW will continue to follow and assist as needed.   Employment status:  Retired Forensic scientist:  Information systems manager, [employer] Benef[employer] PT Recommendations:  Not assessed at this time Information / Referral to community resources:  [address]  Patient/Family's Response to care:  Patient and son are agreeable for patient to return to [facility].   Patient/Family's Understanding of and Emotional Response to Diagnosis, Current Treatment, and Prognosis:  Patient was very pleasant and thanked CSW for assistance.   Emotional Assessment Appearance:  Appears stated age Attitude/Demeanor/Rapport:    Affect (typically observed):  Accepting, Adaptable, Pleasant Orientation:  Oriented to Self, Oriented to Place, Oriented to  Time, Oriented to Situation Alcohol / Substance use:  Not Applicable Psych involvement (Current and /or in the community):  No (Comment)  Discharge Needs  Concerns to be addressed:  Discharge Planning Concerns Readmission within the last 30 days:  No Current discharge risk:  Dependent with Mobility, Chronically ill Barriers to Discharge:  Continued Medical Work up   [employer], Veronia Beets, LCSW 02/18/2018, 9:53 AM

## 2018-02-18 NOTE — Progress Notes (Signed)
Family Meeting Note  Advance Directive:yes  Today a meeting took place with the Patient's son in  the patient's room  Patient is unable to participate due ZO:XWRUEA capacity Dementia   The following clinical team members were present during this meeting:MD  The following were discussed:Patient's diagnosis: Sepsis, acute on chronic COPD exacerbation, pneumonia, other comorbidities including COPD, diabetes mellitus, hypertension, posttraumatic stress disorder and other significant medical problems and treatment plan of care discussed in detail with the patient and son, he verbalized understanding of the plan patient's progosis: Unable to determine and Goals for treatment: DNR son Harvie Heck and  sister Gavin Pound healthcare power of attorney  Additional follow-up to be provided: Hospitalist  Time spent during discussion:17 min  Ramonita Lab, MD

## 2018-02-18 NOTE — Progress Notes (Signed)
[facility] - [place] at [facility]   PATIENT NAME: [name]    MR#:  [id number]  DATE OF BIRTH:  07/01/1933  SUBJECTIVE:  CHIEF COMPLAINT:.  Patient is a poor historian from baseline dementia  REVIEW OF SYSTEMS:     Limited review of systems   CONSTITUTIONAL: No fever, fatigue or weakness.  RESPIRATORY: Some cough, shortness of breath,  denies wheezing CARDIOVASCULAR: No chest pain, orthopnea, edema.  GASTROINTESTINAL: No nausea, vomiting, diarrhea or abdominal pain.  NEUROLOGIC: No tingling, numbness, weakness.   DRUG ALLERGIES:   Allergies  Allergen Reactions  . Yellow Jacket Venom Anaphylaxis  . Aggrenox [Aspirin-Dipyridamole Er] Other (See Comments)    Reaction: unknown  . Bactrim [Sulfamethoxazole-Trimethoprim] Other (See Comments)    Reaction: unknown  . Betadine Prepstick Plus Other (See Comments)    Reaction: unknown  . Contrast Media [Iodinated Diagnostic Agents] Other (See Comments)    Reaction: unknown. Patient states that he does not remember the reaction, but it happened at the [place]  . Cortisone Other (See Comments)    Reaction: unknown  . Penicillins Other (See Comments)    Reaction: unknown Has patient had a PCN reaction causing immediate rash, facial/tongue/throat swelling, SOB or lightheadedness with hypotension: Unknown Has patient had a PCN reaction causing severe rash involving mucus membranes or skin necrosis: Unknown Has patient had a PCN reaction that required hospitalization: Unknown Has patient had a PCN reaction occurring within the last 10 years: Unknown If all of the above answers are "NO", then may proceed with Cephalosporin use.  . Salsalate Other (See Comments)    Reaction: unknown  . Sulfa Antibiotics Other (See Comments)    Reaction: unknown  . Tegretol [Carbamazepine] Other (See Comments)    Reaction: unknown    VITALS:  Blood pressure (!) 102/55, pulse 68, temperature 97.8 F (36.6 C), temperature  source Oral, resp. rate 16, height  (1.88 m), weight 84.4 kg (186 lb 1.1 oz), SpO2 98 %.  PHYSICAL EXAMINATION:  GENERAL:  84 y.o.-year-old patient lying in the bed with no acute distress.  EYES: Pupils equal, round, reactive to light and accommodation. No scleral icterus. Extraocular muscles intact.  HEENT: Head atraumatic, normocephalic. Oropharynx and nasopharynx clear.  NECK:  Supple, no jugular venous distention. No thyroid enlargement, no tenderness.  LUNGS:  moderate breath sounds bilaterally, no wheezing, rales,rhonchi or crepitation. No use of accessory muscles of respiration.  CARDIOVASCULAR: S1, S2 normal. No murmurs, rubs, or gallops.  ABDOMEN: Soft, nontender, nondistended. Bowel sounds present. No organomegaly or mass.  EXTREMITIES: No pedal edema, cyanosis, or clubbing.  NEUROLOGIC:   Awake and alert but oriented x 1-2 PSYCHIATRIC: The patient is alert and oriented x   1-2 SKIN: No obvious rash, lesion, or ulcer.    LABORATORY PANEL:   CBC Recent Labs  Lab 02/18/18 0125  WBC 7.4  HGB 11.3*  HCT 34.9*  PLT 88*   ------------------------------------------------------------------------------------------------------------------  Chemistries  Recent Labs  Lab 02/18/18 0125  NA 136  K 4.8  CL 100*  CO2 26  GLUCOSE 185*  BUN 30*  CREATININE 1.01  CALCIUM 9.2  AST 26  ALT 15*  ALKPHOS 67  BILITOT 0.6   ------------------------------------------------------------------------------------------------------------------  Cardiac Enzymes Recent Labs  Lab 02/17/18 1927  TROPONINI <0.03   ------------------------------------------------------------------------------------------------------------------  RADIOLOGY:  Dg Chest 2 View  Result Date: 02/17/2018 CLINICAL DATA:  Shortness of breath. EXAM: CHEST - 2 VIEW COMPARISON:  One-view chest x-ray 09/02/2017. FINDINGS: The heart is mildly enlarged.  Aortic atherosclerosis is again seen. Ill-defined left lower  lobe airspace disease is present. The right lung is clear. No significant effusions are present. The visualized soft tissues and bony thorax are unremarkable. IMPRESSION: 1. Ill-defined left lower lobe airspace disease raises concern for pneumonia. 2. Borderline cardiomegaly without failure. 3.  Aortic Atherosclerosis (ICD10-I70.0). Electronically Signed   By: Marin Roberts M.D.   On: 02/17/2018 20:17    EKG:   Orders placed or performed during the hospital encounter of 02/17/18  . ED EKG  . ED EKG  . EKG    ASSESSMENT AND PLAN:   1.  Acute on chronic respiratory failure with hypoxia, secondary to pneumonia and COPD exacerbation.  Continue cefepime and vancomycin patient is resting comfortably and reports shortness of breath is better broad-spectrum IV antibiotics.  We will continue oxygen therapy, nebulizer treatments and IV steroids. 2.  Sepsis, likely secondary to pneumonia.  See treatment as above   We will follow lactic acid level trend 4.7-3.6 3.  Facility acquired pneumonia.  Patient is started on cefepime and vancomycin IV; continue oxygen therapy, nebulizer treatments and IV steroids. 4.  Acute COPD exacerbation, secondary to pneumonia.    Continue IV steroids and nebulizer treatments and taper steroids slowly 5.  Advanced vascular dementia, stable.  Continue to monitor clinically closely. 6.  Diabetes type 2.  Will monitor blood sugars before meals and at bedtime and treat with insulin during the hospital stay. 7.  Thrombocytopenia-no active bleeding or bruising.  Discontinued aspirin heparin as platelet count is at 88,000  DVT prophylaxis with SCDs      All the records are reviewed and case discussed with Care Management/Social Workerr. Management plans discussed with the patient, son and they are in agreement.  CODE STATUS: dnr   TOTAL TIME TAKING CARE OF THIS PATIENT: 35  minutes.   POSSIBLE D/C IN 2 DAYS, DEPENDING ON CLINICAL CONDITION.  Note: This  dictation was prepared with Dragon dictation along with smaller phrase technology. Any transcriptional errors that result from this process are unintentional.   Ramonita Lab M.D on 02/18/2018 at 8:28 PM  Between 7am to 6pm - Pager - [phone] After 6pm go to [email or web address] - password [email or web address] [facility]  [place] [facility]  Office  [phone]  CC: Primary care physician; Evangeline Gula, MD

## 2018-02-19 LAB — CBC
HCT: 29.8 % — ABNORMAL LOW (ref 40.0–52.0)
HEMOGLOBIN: 9.8 g/dL — AB (ref 13.0–18.0)
MCH: 30.6 pg (ref 26.0–34.0)
MCHC: 33 g/dL (ref 32.0–36.0)
MCV: 92.7 fL (ref 80.0–100.0)
Platelets: 68 10*3/uL — ABNORMAL LOW (ref 150–440)
RBC: 3.21 MIL/uL — AB (ref 4.40–5.90)
RDW: 17.3 % — ABNORMAL HIGH (ref 11.5–14.5)
WBC: 10.7 10*3/uL — ABNORMAL HIGH (ref 3.8–10.6)

## 2018-02-19 LAB — LACTIC ACID, PLASMA: LACTIC ACID, VENOUS: 1.6 mmol/L (ref 0.5–1.9)

## 2018-02-19 LAB — GLUCOSE, CAPILLARY
GLUCOSE-CAPILLARY: 235 mg/dL — AB (ref 65–99)
Glucose-Capillary: 225 mg/dL — ABNORMAL HIGH (ref 65–99)
Glucose-Capillary: 205 mg/dL — ABNORMAL HIGH (ref 65–99)
Glucose-Capillary: 230 mg/dL — ABNORMAL HIGH (ref 65–99)

## 2018-02-19 LAB — URINALYSIS, COMPLETE (UACMP) WITH MICROSCOPIC
BACTERIA UA: NONE SEEN
BILIRUBIN URINE: NEGATIVE
GLUCOSE, UA: NEGATIVE mg/dL
KETONES UR: 5 mg/dL — AB
NITRITE: NEGATIVE
PROTEIN: NEGATIVE mg/dL
Specific Gravity, Urine: 1.021 (ref 1.005–1.030)
pH: 5 (ref 5.0–8.0)

## 2018-02-19 LAB — VANCOMYCIN, TROUGH: VANCOMYCIN TR: 22 ug/mL — AB (ref 15–20)

## 2018-02-19 MED ORDER — IPRATROPIUM-ALBUTEROL 0.5-2.5 (3) MG/3ML IN SOLN
3.0000 mL | Freq: Three times a day (TID) | RESPIRATORY_TRACT | Status: DC
  Administered 2018-02-19 – 2018-02-20 (×3): 3 mL via RESPIRATORY_TRACT
  Filled 2018-02-19 (×4): qty 3

## 2018-02-19 MED ORDER — METHYLPREDNISOLONE SODIUM SUCC 40 MG IJ SOLR
40.0000 mg | Freq: Two times a day (BID) | INTRAMUSCULAR | Status: DC
  Administered 2018-02-20 (×2): 40 mg via INTRAVENOUS
  Filled 2018-02-19 (×2): qty 1

## 2018-02-19 NOTE — Progress Notes (Addendum)
[facility] Physicians - [place] at [facility]   PATIENT NAME: [name]    MR#:  [id number]  DATE OF BIRTH:  September 05, 1933  SUBJECTIVE:  CHIEF COMPLAINT:.  Patient is a poor historian from baseline dementia hypotensive today.  No family members at bedside  REVIEW OF SYSTEMS:     Limited review of systems   CONSTITUTIONAL: No fever, fatigue or weakness.  RESPIRATORY: Some cough, shortness of breath,  denies wheezing CARDIOVASCULAR: No chest pain, orthopnea, edema.  GASTROINTESTINAL: No nausea, vomiting, diarrhea or abdominal pain.  NEUROLOGIC: No tingling, numbness, weakness.   DRUG ALLERGIES:   Allergies  Allergen Reactions  . Yellow Jacket Venom Anaphylaxis  . Aggrenox [Aspirin-Dipyridamole Er] Other (See Comments)    Reaction: unknown  . Bactrim [Sulfamethoxazole-Trimethoprim] Other (See Comments)    Reaction: unknown  . Betadine Prepstick Plus Other (See Comments)    Reaction: unknown  . Contrast Media [Iodinated Diagnostic Agents] Other (See Comments)    Reaction: unknown. Patient states that he does not remember the reaction, but it happened at the [place]  . Cortisone Other (See Comments)    Reaction: unknown  . Penicillins Other (See Comments)    Reaction: unknown Has patient had a PCN reaction causing immediate rash, facial/tongue/throat swelling, SOB or lightheadedness with hypotension: Unknown Has patient had a PCN reaction causing severe rash involving mucus membranes or skin necrosis: Unknown Has patient had a PCN reaction that required hospitalization: Unknown Has patient had a PCN reaction occurring within the last 10 years: Unknown If all of the above answers are "NO", then may proceed with Cephalosporin use.  . Salsalate Other (See Comments)    Reaction: unknown  . Sulfa Antibiotics Other (See Comments)    Reaction: unknown  . Tegretol [Carbamazepine] Other (See Comments)    Reaction: unknown    VITALS:  Blood pressure (!) 102/50, pulse  (!) 59, temperature 97.6 F (36.4 C), temperature source Oral, resp. rate 16, height  (1.88 m), weight 85 kg (187 lb 6.4 oz), SpO2 99 %.  PHYSICAL EXAMINATION:  GENERAL:  84 y.o.-year-old patient lying in the bed with no acute distress.  EYES: Pupils equal, round, reactive to light and accommodation. No scleral icterus. Extraocular muscles intact.  HEENT: Head atraumatic, normocephalic. Oropharynx and nasopharynx clear.  NECK:  Supple, no jugular venous distention. No thyroid enlargement, no tenderness.  LUNGS:  moderate breath sounds bilaterally, no wheezing, rales,rhonchi or crepitation. No use of accessory muscles of respiration.  CARDIOVASCULAR: S1, S2 normal. No murmurs, rubs, or gallops.  ABDOMEN: Soft, nontender, nondistended. Bowel sounds present. No organomegaly or mass.  EXTREMITIES: No pedal edema, cyanosis, or clubbing.  NEUROLOGIC:   Awake and alert but oriented x 1-2 PSYCHIATRIC: The patient is alert and oriented x   1-2 SKIN: No obvious rash, lesion, or ulcer.    LABORATORY PANEL:   CBC Recent Labs  Lab 02/19/18 0457  WBC 10.7*  HGB 9.8*  HCT 29.8*  PLT 68*   ------------------------------------------------------------------------------------------------------------------  Chemistries  Recent Labs  Lab 02/18/18 0125  NA 136  K 4.8  CL 100*  CO2 26  GLUCOSE 185*  BUN 30*  CREATININE 1.01  CALCIUM 9.2  AST 26  ALT 15*  ALKPHOS 67  BILITOT 0.6   ------------------------------------------------------------------------------------------------------------------  Cardiac Enzymes Recent Labs  Lab 02/17/18 1927  TROPONINI <0.03   ------------------------------------------------------------------------------------------------------------------  RADIOLOGY:  No results found.  EKG:   Orders placed or performed during the hospital encounter of 02/17/18  . ED EKG  .  ED EKG  . EKG    ASSESSMENT AND PLAN:   1.  Acute on chronic respiratory  failure with hypoxia, secondary to pneumonia and COPD exacerbation.  Continue cefepime and discontinue vancomycin as MRSA PCR is negative patient is resting comfortably and reports shortness of breath is better   We will continue oxygen therapy, nebulizer treatments and IV steroids.  2.  Sepsis, likely secondary to pneumonia.  See treatment as above   lactic acid normalized at 1.6 today Patient is hypotensive , providing IV fluids Initial blood cultures from 02/17/2018 has revealed coag negative Staphylococcus and repeat blood cultures on 02/18/2018 are negative.  MRSA PCR negative   3.  Facility acquired pneumonia.  Patient is on cefepime and discontinued vancomycin IV as MRSA PCR is negative; continue oxygen therapy, nebulizer treatments and Taper IV steroids  4.  Acute COPD exacerbation, secondary to pneumonia.    Continue tapering IV steroids and nebulizer treatments to be continued   5.  Advanced vascular dementia, stable.  Continue to monitor clinically closely.   6.  Diabetes type 2.  Will monitor blood sugars before meals and at bedtime and treat with insulin during the hospital stay.   7.  Thrombocytopenia-no active bleeding or bruising.  Discontinued aspirin heparin as platelet count is at 88,000--68,000 today  DVT prophylaxis with SCDs      All the records are reviewed and case discussed with Care Management/Social Workerr. Management plans discussed with the patient, son and they are in agreement.  CODE STATUS: dnr   TOTAL TIME TAKING CARE OF THIS PATIENT: 35  minutes.   POSSIBLE D/C IN 2 DAYS, DEPENDING ON CLINICAL CONDITION.  Note: This dictation was prepared with Dragon dictation along with smaller phrase technology. Any transcriptional errors that result from this process are unintentional.   Ramonita Lab M.D on 02/19/2018 at 9:23 PM  Between 7am to 6pm - Pager - [phone] After 6pm go to [email or web address] - password [email or web address]  [place] [facility]  Office   [phone]  CC: Primary care physician; Evangeline Gula, MD

## 2018-02-19 NOTE — Progress Notes (Signed)
PHARMACY - PHYSICIAN COMMUNICATION CRITICAL VALUE ALERT - BLOOD CULTURE IDENTIFICATION (BCID)  ISSAAC SHIPPER is an 84 y.o. male who presented to [facility] on 02/17/2018 with a chief complaint of SOB  Assessment:  Hypotensive 80's systolic, LA 4.2 >> 3.6, CXR ill-defined PNA  Name of physician (or Provider) Contacted: Cammy Copa  Current antibiotics: Vanc/cefepime  Changes to prescribed antibiotics recommended:  Patient is on recommended antibiotics - No changes needed 1/4 GPC BCID Staph species MecA + in prior set, then 1/4 GPC in a second set, MRSA PCR - Will continue current abx considering patient is still septic  Results for orders placed or performed during the hospital encounter of 02/17/18  Blood Culture ID Panel (Reflexed) (Collected: 02/17/2018  7:32 PM)  Result Value Ref Range   Enterococcus species NOT DETECTED NOT DETECTED   Listeria monocytogenes NOT DETECTED NOT DETECTED   Staphylococcus species DETECTED (A) NOT DETECTED   Staphylococcus aureus NOT DETECTED NOT DETECTED   Methicillin resistance DETECTED (A) NOT DETECTED   Streptococcus species NOT DETECTED NOT DETECTED   Streptococcus agalactiae NOT DETECTED NOT DETECTED   Streptococcus pneumoniae NOT DETECTED NOT DETECTED   Streptococcus pyogenes NOT DETECTED NOT DETECTED   Acinetobacter baumannii NOT DETECTED NOT DETECTED   Enterobacteriaceae species NOT DETECTED NOT DETECTED   Enterobacter cloacae complex NOT DETECTED NOT DETECTED   Escherichia coli NOT DETECTED NOT DETECTED   Klebsiella oxytoca NOT DETECTED NOT DETECTED   Klebsiella pneumoniae NOT DETECTED NOT DETECTED   Proteus species NOT DETECTED NOT DETECTED   Serratia marcescens NOT DETECTED NOT DETECTED   Haemophilus influenzae NOT DETECTED NOT DETECTED   Neisseria meningitidis NOT DETECTED NOT DETECTED   Pseudomonas aeruginosa NOT DETECTED NOT DETECTED   Candida albicans NOT DETECTED NOT DETECTED   Candida glabrata NOT DETECTED NOT DETECTED   Candida  krusei NOT DETECTED NOT DETECTED   Candida parapsilosis NOT DETECTED NOT DETECTED   Candida tropicalis NOT DETECTED NOT DETECTED   Thomasene Ripple, PharmD, BCPS Clinical Pharmacist 02/19/2018

## 2018-02-19 NOTE — Progress Notes (Signed)
CRITICAL VALUE ALERT  Critical Value:  Lactic Acid 3.6  Date & Time Notified:  02/18/2018 1926  Provider Notified: Dr. Elisabeth Pigeon  Orders Received/Actions taken: Increase continuous fluid rate to 143ml/hr. Recheck lactic at 2230. Will continue to monitor

## 2018-02-20 LAB — GLUCOSE, CAPILLARY
Glucose-Capillary: 194 mg/dL — ABNORMAL HIGH (ref 65–99)
Glucose-Capillary: 228 mg/dL — ABNORMAL HIGH (ref 65–99)

## 2018-02-20 LAB — CBC
HEMATOCRIT: 28.5 % — AB (ref 40.0–52.0)
Hemoglobin: 9.3 g/dL — ABNORMAL LOW (ref 13.0–18.0)
MCH: 29.9 pg (ref 26.0–34.0)
MCHC: 32.8 g/dL (ref 32.0–36.0)
MCV: 91.3 fL (ref 80.0–100.0)
Platelets: 82 10*3/uL — ABNORMAL LOW (ref 150–440)
RBC: 3.12 MIL/uL — ABNORMAL LOW (ref 4.40–5.90)
RDW: 17.9 % — AB (ref 11.5–14.5)
WBC: 10.9 10*3/uL — ABNORMAL HIGH (ref 3.8–10.6)

## 2018-02-20 MED ORDER — INSULIN ASPART 100 UNIT/ML ~~LOC~~ SOLN: 0.0000 [IU] | Freq: Three times a day (TID) | SUBCUTANEOUS | 11 refills | Status: DC

## 2018-02-20 MED ORDER — CEFDINIR 300 MG PO CAPS: 300.0000 mg | ORAL_CAPSULE | Freq: Two times a day (BID) | ORAL | 0 refills | Status: DC

## 2018-02-20 MED ORDER — HYDROCODONE-ACETAMINOPHEN 5-325 MG PO TABS: 1.0000 | ORAL_TABLET | Freq: Four times a day (QID) | ORAL | 0 refills | Status: DC | PRN

## 2018-02-20 MED ORDER — AMOXICILLIN-POT CLAVULANATE 875-125 MG PO TABS: 1.0000 | ORAL_TABLET | Freq: Two times a day (BID) | ORAL | 0 refills | Status: DC

## 2018-02-20 MED ORDER — PREDNISONE 10 MG (21) PO TBPK: 10.0000 mg | ORAL_TABLET | Freq: Every day | ORAL | 0 refills | Status: DC

## 2018-02-20 MED ORDER — SACCHAROMYCES BOULARDII 250 MG PO CAPS: 250.0000 mg | ORAL_CAPSULE | Freq: Two times a day (BID) | ORAL | 0 refills | Status: DC

## 2018-02-20 MED ORDER — PREMIER PROTEIN SHAKE: 11.0000 [oz_av] | Freq: Three times a day (TID) | ORAL | 0 refills | Status: DC

## 2018-02-20 MED ORDER — NYSTATIN 100000 UNIT/GM EX POWD: Freq: Three times a day (TID) | CUTANEOUS | 0 refills | Status: DC

## 2018-02-20 MED ORDER — CEFDINIR 300 MG PO CAPS
300.0000 mg | ORAL_CAPSULE | Freq: Two times a day (BID) | ORAL | Status: DC
  Filled 2018-02-20: qty 1

## 2018-02-20 MED ORDER — AMOXICILLIN-POT CLAVULANATE 875-125 MG PO TABS: 1.0000 | ORAL_TABLET | Freq: Two times a day (BID) | ORAL | Status: DC

## 2018-02-20 NOTE — Progress Notes (Signed)
Pharmacy Antibiotic Note  [name] is a 84 y.o. male admitted on 02/17/2018 with sepsis.  Pharmacy has been consulted for vanc/cefepime dosing.  Plan: Vancomycin d/c 5/9 with negative MRSA PCR and staph in BCx from 5/7 likely representing contamination.   Continue cefepime 2 g iv q 12 hours.   Height:  (188 cm) Weight: 188 lb (85.3 kg) IBW/kg (Calculated) : 82.2  Temp (24hrs), Avg:97.5 F (36.4 C), Min:97.5 F (36.4 C), Max:97.6 F (36.4 C)  Recent Labs  Lab 02/17/18 1927  02/18/18 0125  02/18/18 0455 02/18/18 1026 02/18/18 1353 02/18/18 2045 02/19/18 0457 02/19/18 1429 02/20/18 0538  WBC 10.6  --  7.4  --   --   --   --   --  10.7*  --  10.9*  CREATININE 0.92  --  1.01  --   --   --   --   --   --   --   --   LATICACIDVEN 4.8*   < >  --    < > 2.4* 4.7* 3.6* 3.6* 1.6  --   --   VANCOTROUGH  --   --   --   --   --   --   --   --   --  22*  --    < > = values in this interval not displayed.    Estimated Creatinine Clearance: 65.6 mL/min (by C-G formula based on SCr of 1.01 mg/dL).    Allergies  Allergen Reactions  . Yellow Jacket Venom Anaphylaxis  . Aggrenox [Aspirin-Dipyridamole Er] Other (See Comments)    Reaction: unknown  . Bactrim [Sulfamethoxazole-Trimethoprim] Other (See Comments)    Reaction: unknown  . Betadine Prepstick Plus Other (See Comments)    Reaction: unknown  . Contrast Media [Iodinated Diagnostic Agents] Other (See Comments)    Reaction: unknown. Patient states that he does not remember the reaction, but it happened at the [place]  . Cortisone Other (See Comments)    Reaction: unknown  . Penicillins Other (See Comments)    Reaction: unknown Has patient had a PCN reaction causing immediate rash, facial/tongue/throat swelling, SOB or lightheadedness with hypotension: Unknown Has patient had a PCN reaction causing severe rash involving mucus membranes or skin necrosis: Unknown Has patient had a PCN reaction that required hospitalization:  Unknown Has patient had a PCN reaction occurring within the last 10 years: Unknown If all of the above answers are "NO", then may proceed with Cephalosporin use.  . Salsalate Other (See Comments)    Reaction: unknown  . Sulfa Antibiotics Other (See Comments)    Reaction: unknown  . Tegretol [Carbamazepine] Other (See Comments)    Reaction: unknown   Thank you for allowing pharmacy to be a part of this patient's care.  Luisa Hart, PharmD Clinical Pharmacist t 02/20/2018

## 2018-02-20 NOTE — Progress Notes (Signed)
Patient is medically stable for D/C back to [facility] today. Per Gavin Pound admissions coordinator at [facility] patient can come today to room 115-A. RN will call report to [facility] and arrange EMS for transport. Clinical Child psychotherapist (CSW) sent D/C orders to [facility] via [place]. Patient is aware of above. Patient's daughter Gavin Pound is at bedside and aware of above. Please reconsult if future social work needs arise. CSW signing off.   [employer], LCSW [phone]

## 2018-02-20 NOTE — Discharge Summary (Addendum)
[facility] [place] at [facility]   PATIENT NAME: [name]    MR#:  [id number]  DATE OF BIRTH:  08/14/1933  DATE OF ADMISSION:  02/17/2018 ADMITTING PHYSICIAN: William Copa, MD  DATE OF DISCHARGE: 02/20/18  PRIMARY CARE PHYSICIAN: William Gula, MD    ADMISSION DIAGNOSIS:  COPD exacerbation (HCC) [J44.1] Acute respiratory failure with hypoxia (HCC) [J96.01] Elevated lactic acid level [R79.89]  DISCHARGE DIAGNOSIS:  Active Problems:   Sepsis (HCC) facility aquired  PNA   SECONDARY DIAGNOSIS:   Past Medical History:  Diagnosis Date  . COPD (chronic obstructive pulmonary disease) (HCC)   . CVA (cerebral infarction)   . Diabetes mellitus   . GI bleed   . Hypertension   . MDD (major depressive disorder)    w/ paranoia  . PTSD (post-traumatic stress disorder)   . Vascular dementia     HOSPITAL COURSE:   HISTORY OF PRESENT ILLNESS: [name]  is a 84 y.o. male, NH resident, with a known history of COPD on 3 L continuous oxygen, diabetes type 2, stroke, hypertension and vascular dementia. Patient is not able to provide history due to dementia.  Most of the information was taken from reviewing the medical records and from discussion with emergency room physician. He was transferred from nursing home due to shortness of breath and hypoxia oxygen saturation was 86% on 3 L by nasal cannula.  Generalized weakness and occasional cough were noted for the past 2 days, but no fever or chills reported. Blood test done in the emergency room are significant for elevated lactic acid at 4.8.  WBC is normal at 10.6. Chest x-ray, reviewed by myself, shows left lower lobe pneumonia. Patient is admitted for further evaluation and treatment.  1.Acute on chronic respiratory failure with hypoxia,secondary to pneumonia and COPD exacerbation. Clinically improved with cefepime and discontinued vancomycin as MRSA PCR is negative patient is resting comfortably and  reports shortness of breath is better .  Discharge patient with p.o. omnicef We will continue oxygen therapy, nebulizer treatments and IV steroids tapered to po prednisone.  2.Sepsis,likely secondary to pneumonia.See treatment as above   lactic acid normalized at 1.6 today Patient is hypotensive , improved with IV fluids Initial blood cultures from 02/17/2018 has revealed coag negative Staphylococcus and repeat blood cultures on 02/18/2018 are negative.  MRSA PCR negative Discharge with the omnicef  3.Facility acquired pneumonia.Patient is on cefepime, clinically improved and  and discontinued vancomycin IV as MRSA PCR is negative;continue oxygen therapy, nebulizer treatments and Taper IV steroids.  Discharged with p.o.omnicef   4.Acute COPD exacerbation,secondary to pneumonia.  Continue tapering IV steroids to p.o. steroids and nebulizer treatments to be continued   5.Advanced vascular dementia,stable.Continue to monitor clinically closely.   6.Diabetes type 2.Will monitor blood sugars before meals and at bedtime and treat with insulin during the hospital stay.   7.  Thrombocytopenia-no active bleeding or bruising.  Discontinued aspirin heparin as platelet count is at 88,000--68,000-- 82,000 today  DVT prophylaxis with SCDs    DISCHARGE CONDITIONS:   fair  CONSULTS OBTAINED:     PROCEDURES  None   DRUG ALLERGIES:   Allergies  Allergen Reactions  . Yellow Jacket Venom Anaphylaxis  . Aggrenox [Aspirin-Dipyridamole Er] Other (See Comments)    Reaction: unknown  . Bactrim [Sulfamethoxazole-Trimethoprim] Other (See Comments)    Reaction: unknown  . Betadine Prepstick Plus Other (See Comments)    Reaction: unknown  . Contrast Media [Iodinated Diagnostic Agents] Other (See Comments)  Reaction: unknown. Patient states that he does not remember the reaction, but it happened at the [place]  . Cortisone Other (See Comments)    Reaction: unknown  .  Penicillins Other (See Comments)    Reaction: unknown Has patient had a PCN reaction causing immediate rash, facial/tongue/throat swelling, SOB or lightheadedness with hypotension: Unknown Has patient had a PCN reaction causing severe rash involving mucus membranes or skin necrosis: Unknown Has patient had a PCN reaction that required hospitalization: Unknown Has patient had a PCN reaction occurring within the last 10 years: Unknown If all of the above answers are "NO", then may proceed with Cephalosporin use.  . Salsalate Other (See Comments)    Reaction: unknown  . Sulfa Antibiotics Other (See Comments)    Reaction: unknown  . Tegretol [Carbamazepine] Other (See Comments)    Reaction: unknown    DISCHARGE MEDICATIONS:   Allergies as of 02/20/2018      Reactions   Yellow Jacket Venom Anaphylaxis   Aggrenox [aspirin-dipyridamole Er] Other (See Comments)   Reaction: unknown   Bactrim [sulfamethoxazole-trimethoprim] Other (See Comments)   Reaction: unknown   Betadine Prepstick Plus Other (See Comments)   Reaction: unknown   Contrast Media [iodinated Diagnostic Agents] Other (See Comments)   Reaction: unknown. Patient states that he does not remember the reaction, but it happened at the [place]   Cortisone Other (See Comments)   Reaction: unknown   Penicillins Other (See Comments)   Reaction: unknown Has patient had a PCN reaction causing immediate rash, facial/tongue/throat swelling, SOB or lightheadedness with hypotension: Unknown Has patient had a PCN reaction causing severe rash involving mucus membranes or skin necrosis: Unknown Has patient had a PCN reaction that required hospitalization: Unknown Has patient had a PCN reaction occurring within the last 10 years: Unknown If all of the above answers are "NO", then may proceed with Cephalosporin use.   Salsalate Other (See Comments)   Reaction: unknown   Sulfa Antibiotics Other (See Comments)   Reaction: unknown   Tegretol  [carbamazepine] Other (See Comments)   Reaction: unknown      Medication List    STOP taking these medications   theophylline 200 MG 12 hr tablet Commonly known as:  THEODUR     TAKE these medications   acetaminophen 325 MG tablet Commonly known as:  TYLENOL Take 650 mg by mouth every 8 (eight) hours.   albuterol 108 (90 Base) MCG/ACT inhaler Commonly known as:  PROVENTIL HFA;VENTOLIN HFA Inhale 1 puff into the lungs every 6 (six) hours as needed for shortness of breath.   ARIPiprazole 5 MG tablet Commonly known as:  ABILIFY Take 5 mg by mouth daily.   aspirin EC 81 MG tablet Take 81 mg by mouth daily.   atorvastatin 40 MG tablet Commonly known as:  LIPITOR Take 40 mg by mouth at bedtime.   BENGAY GREASELESS EX Apply 1 application topically as needed (pain).   carbamide peroxide 6.5 % OTIC solution Commonly known as:  DEBROX Place 5 drops into both ears 2 (two) times daily as needed (cerum impaction).   cefdinir 300 MG capsule Commonly known as:  OMNICEF Take 1 capsule (300 mg total) by mouth 2 (two) times daily.   DSS 100 MG Caps Take 100 mg by mouth daily.   gabapentin 300 MG capsule Commonly known as:  NEURONTIN Take 300 mg by mouth 2 (two) times daily.   HYDROcodone-acetaminophen 5-325 MG tablet Commonly known as:  NORCO/VICODIN Take 1 tablet by mouth every 6 (  six) hours as needed for moderate pain.   INCRUSE ELLIPTA 62.5 MCG/INH Aepb Generic drug:  umeclidinium bromide Inhale 1 puff into the lungs daily.   insulin aspart 100 UNIT/ML injection Commonly known as:  novoLOG Inject 0-5 Units into the skin at bedtime. What changed:  Another medication with the same name was changed. Make sure you understand how and when to take each.   insulin aspart 100 UNIT/ML injection Commonly known as:  novoLOG Inject 0-9 Units into the skin 3 (three) times daily with meals. CBG < 70: implement hypoglycemia protocol CBG 70 - 120: 0 units CBG 121 - 150: 1 unit CBG  151 - 200: 2 units CBG 201 - 250: 3 units CBG 251 - 300: 5 units CBG 301 - 350: 7 units CBG 351 - 400: 9 units CBG > 400: call MD and obtain STAT lab verification, Do NOT hold if patient is NPO. Sensitive Scale. What changed:  additional instructions   ketoconazole 2 % shampoo Commonly known as:  NIZORAL Apply 1 application topically as directed. Apply to wet scalp and face twice weekly as needed for dandruff/ flakey skin   magnesium oxide 400 (241.3 Mg) MG tablet Commonly known as:  MAG-OX Take 2 tablets (800 mg total) by mouth daily. What changed:  when to take this   metFORMIN 1000 MG tablet Commonly known as:  GLUCOPHAGE Take 1,000 mg by mouth 2 (two) times daily with a meal.   MINERIN Lotn Apply 1 application topically daily. Apply to face   multivitamin with minerals Tabs tablet Take 1 tablet by mouth daily.   nitroGLYCERIN 0.4 MG SL tablet Commonly known as:  NITROSTAT Place 1 tablet (0.4 mg total) under the tongue every 5 (five) minutes as needed for chest pain.   nystatin powder Commonly known as:  MYCOSTATIN/NYSTOP Apply topically 3 (three) times daily. To affected penile area   pantoprazole 40 MG tablet Commonly known as:  PROTONIX Take 1 tablet (40 mg total) by mouth daily.   polyethylene glycol packet Commonly known as:  MIRALAX / GLYCOLAX Take 17 g by mouth 2 (two) times daily as needed for mild constipation.   predniSONE 10 MG (21) Tbpk tablet Commonly known as:  STERAPRED UNI-PAK 21 TAB Take 1 tablet (10 mg total) by mouth daily. Take 6 tablets by mouth for 1 day followed by  5 tablets by mouth for 1 day followed by  4 tablets by mouth for 1 day followed by  3 tablets by mouth for 1 day followed by  2 tablets by mouth for 1 day followed by  1 tablet by mouth for a day and stop   protein supplement shake Liqd Commonly known as:  PREMIER PROTEIN Take 325 mLs (11 oz total) by mouth 3 (three) times daily between meals.   saccharomyces boulardii 250  MG capsule Commonly known as:  FLORASTOR Take 1 capsule (250 mg total) by mouth 2 (two) times daily.   sennosides-docusate sodium 8.6-50 MG tablet Commonly known as:  SENOKOT-S Take 1 tablet by mouth 2 (two) times daily.   tamsulosin 0.4 MG Caps capsule Commonly known as:  FLOMAX Take 0.4 mg by mouth at bedtime.   tiotropium 18 MCG inhalation capsule Commonly known as:  SPIRIVA Place 1 capsule (18 mcg total) into inhaler and inhale daily.   venlafaxine XR 150 MG 24 hr capsule Commonly known as:  EFFEXOR-XR Take 1 capsule (150 mg total) by mouth daily with breakfast.   white petrolatum Gel Commonly known as:  VASELINE Apply 1 application topically 3 (three) times daily as needed (nose bleeds). Apply to bilateral nares        DISCHARGE INSTRUCTIONS:  Follow-up with primary care physician at [facility] Dr. Antonietta Barcelona as scheduled on May 20 Follow-up with primary care physician at the facility in 5 days  Dysphagia 3 diet  continue 2 L of oxygen via nasal cannula Patient is total care, bedbound    DIET:  Diabetic diet dysphagia 3  DISCHARGE CONDITION:  Fair  ACTIVITY:  Bedbound, total care  OXYGEN:  Home Oxygen: Yes.     Oxygen Delivery: 2 liters/min via Patient connected to nasal cannula oxygen  DISCHARGE LOCATION:  [facility] long-term care  If you experience worsening of your admission symptoms, develop shortness of breath, life threatening emergency, suicidal or homicidal thoughts you must seek medical attention immediately by calling 911 or calling your MD immediately  if symptoms less severe.  You Must read complete instructions/literature along with all the possible adverse reactions/side effects for all the Medicines you take and that have been prescribed to you. Take any new Medicines after you have completely understood and accpet all the possible adverse reactions/side effects.   Please note  You were cared for by a hospitalist during your hospital stay. If  you have any questions about your discharge medications or the care you received while you were in the hospital after you are discharged, you can call the unit and asked to speak with the hospitalist on call if the hospitalist that took care of you is not available. Once you are discharged, your primary care physician will handle any further medical issues. Please note that NO REFILLS for any discharge medications will be authorized once you are discharged, as it is imperative that you return to your primary care physician (or establish a relationship with a primary care physician if you do not have one) for your aftercare needs so that they can reassess your need for medications and monitor your lab values.     Today  Chief Complaint  Patient presents with  . Shortness of Breath   Patient is feeling better sustaining his baseline blood pressure after receiving IV fluids.  Okay to discharge patient back to long-term care, discussed with daughter She is agreeable  ROS: Limited from baseline dementia CONSTITUTIONAL: Denies fevers, chills. Denies any fatigue, weakness.  RESPIRATORY: Denies cough, wheeze, shortness of breath.  CARDIOVASCULAR: Denies chest pain, palpitations, edema.  GASTROINTESTINAL: Denies nausea, vomiting, diarrhea, abdominal pain. Denies bright red blood per rectum. HEMATOLOGIC AND LYMPHATIC: Denies easy bruising or bleeding. SKIN: Denies rash or lesion. MUSCULOSKELETAL: Denies pain in neck, back, shoulder, knees, hips or arthritic symptoms.  NEUROLOGIC: Denies paralysis, paresthesias.     VITAL SIGNS:  Blood pressure (!) 91/59, pulse 68, temperature (!) 97.5 F (36.4 C), temperature source Oral, resp. rate 16, height 6\' 2"  (1.88 m), weight 85.3 kg (188 lb), SpO2 97 %.  I/O:    Intake/Output Summary (Last 24 hours) at 02/20/2018 1324 Last data filed at 02/20/2018 1225 Gross per 24 hour  Intake 480 ml  Output 652 ml  Net -172 ml    PHYSICAL EXAMINATION:  GENERAL:   84 y.o.-year-old patient lying in the bed with no acute distress.  EYES: Pupils equal, round, reactive to light and accommodation. No scleral icterus. Extraocular muscles intact.  HEENT: Head atraumatic, normocephalic. Oropharynx and nasopharynx clear.  NECK:  Supple, no jugular venous distention. No thyroid enlargement, no tenderness.  LUNGS: Normal breath sounds  bilaterally, no wheezing, rales,rhonchi or crepitation. No use of accessory muscles of respiration.  CARDIOVASCULAR: S1, S2 normal. No murmurs, rubs, or gallops.  ABDOMEN: Soft, non-tender, non-distended. Bowel sounds present. No organomegaly or mass.  EXTREMITIES: Totally bedbound  nEUROLOGIC: Awake and alert and oriented x1-2  pSYCHIATRIC: The patient is alert and oriented x 1-2.  SKIN: No obvious rash, lesion, or ulcer.   DATA REVIEW:   CBC Recent Labs  Lab 02/20/18 0538  WBC 10.9*  HGB 9.3*  HCT 28.5*  PLT 82*    Chemistries  Recent Labs  Lab 02/18/18 0125  NA 136  K 4.8  CL 100*  CO2 26  GLUCOSE 185*  BUN 30*  CREATININE 1.01  CALCIUM 9.2  AST 26  ALT 15*  ALKPHOS 67  BILITOT 0.6    Cardiac Enzymes Recent Labs  Lab 02/17/18 1927  TROPONINI <0.03    Microbiology Results  Results for orders placed or performed during the hospital encounter of 02/17/18  Culture, blood (Routine x 2)     Status: Abnormal (Preliminary result)   Collection Time: 02/17/18  7:27 PM  Result Value Ref Range Status   Specimen Description   Final    BLOOD LEFT FOREARM Performed at [facility], [address], [place], [place] [address]    Special Requests   Final    BOTTLES DRAWN AEROBIC AND ANAEROBIC Blood Culture results may not be optimal due to an excessive volume of blood received in culture bottles Performed at [facility], [address] [place], [place] [address]    Culture  Setup Time   Final    GRAM POSITIVE COCCI AEROBIC BOTTLE ONLY CRITICAL RESULT CALLED TO, READ BACK BY AND VERIFIED  WITH: DAVID BESANDTI @ 0302 ON 02/19/2018 BY CAF Performed at [facility], [address] [place], [place] [address]    Culture STAPHYLOCOCCUS SPECIES (COAGULASE NEGATIVE) (A)  Final   Report Status PENDING  Incomplete  Culture, blood (Routine x 2)     Status: Abnormal (Preliminary result)   Collection Time: 02/17/18  7:32 PM  Result Value Ref Range Status   Specimen Description   Final    BLOOD RIGHT FOREARM Performed at [facility], [address] [place], [place] [address]    Special Requests   Final    BOTTLES DRAWN AEROBIC AND ANAEROBIC Blood Culture results may not be optimal due to an excessive volume of blood received in culture bottles Performed at [facility], [address] [place], [place] [address]    Culture  Setup Time   Final    ANAEROBIC BOTTLE ONLY GRAM POSITIVE COCCI CRITICAL RESULT CALLED TO, READ BACK BY AND VERIFIED WITH: JASON ROBBINS AT 1913 ON 02/18/2018 JJB    Culture STAPHYLOCOCCUS SPECIES (COAGULASE NEGATIVE) (A)  Final   Report Status PENDING  Incomplete  Blood Culture ID Panel (Reflexed)     Status: Abnormal   Collection Time: 02/17/18  7:32 PM  Result Value Ref Range Status   Enterococcus species NOT DETECTED NOT DETECTED Final   Listeria monocytogenes NOT DETECTED NOT DETECTED Final   Staphylococcus species DETECTED (A) NOT DETECTED Final    Comment: Methicillin (oxacillin) resistant coagulase negative staphylococcus. Possible blood culture contaminant (unless isolated from more than one blood culture draw or clinical case suggests pathogenicity). No antibiotic treatment is indicated for blood  culture contaminants. CRITICAL RESULT CALLED TO, READ BACK BY AND VERIFIED WITH: JASON ROBBINS AT 1913 ON 02/18/2018 JJB  Staphylococcus aureus NOT DETECTED NOT DETECTED Final   Methicillin resistance DETECTED (A) NOT DETECTED Final    Comment: CRITICAL RESULT CALLED TO, READ BACK BY AND VERIFIED WITH: JASON ROBBINS AT 1913  ON 02/18/2018 JJB    Streptococcus species NOT DETECTED NOT DETECTED Final   Streptococcus agalactiae NOT DETECTED NOT DETECTED Final   Streptococcus pneumoniae NOT DETECTED NOT DETECTED Final   Streptococcus pyogenes NOT DETECTED NOT DETECTED Final   Acinetobacter baumannii NOT DETECTED NOT DETECTED Final   Enterobacteriaceae species NOT DETECTED NOT DETECTED Final   Enterobacter cloacae complex NOT DETECTED NOT DETECTED Final   Escherichia coli NOT DETECTED NOT DETECTED Final   Klebsiella oxytoca NOT DETECTED NOT DETECTED Final   Klebsiella pneumoniae NOT DETECTED NOT DETECTED Final   Proteus species NOT DETECTED NOT DETECTED Final   Serratia marcescens NOT DETECTED NOT DETECTED Final   Haemophilus influenzae NOT DETECTED NOT DETECTED Final   Neisseria meningitidis NOT DETECTED NOT DETECTED Final   Pseudomonas aeruginosa NOT DETECTED NOT DETECTED Final   Candida albicans NOT DETECTED NOT DETECTED Final   Candida glabrata NOT DETECTED NOT DETECTED Final   Candida krusei NOT DETECTED NOT DETECTED Final   Candida parapsilosis NOT DETECTED NOT DETECTED Final   Candida tropicalis NOT DETECTED NOT DETECTED Final    Comment: Performed at [facility], [address] [place], [place] [address]  Blood Culture (routine x 2)     Status: None (Preliminary result)   Collection Time: 02/18/18  1:25 AM  Result Value Ref Range Status   Specimen Description BLOOD LEFT AC  Final   Special Requests   Final    BOTTLES DRAWN AEROBIC AND ANAEROBIC Blood Culture adequate volume   Culture   Final    NO GROWTH 2 DAYS Performed at [facility], [address] [place], [place] [address]    Report Status PENDING  Incomplete  Blood Culture (routine x 2)     Status: None (Preliminary result)   Collection Time: 02/18/18  1:38 AM  Result Value Ref Range Status   Specimen Description BLOOD LEFT HAND   Final   Special Requests   Final    BOTTLES DRAWN AEROBIC AND ANAEROBIC Blood Culture  results may not be optimal due to an excessive volume of blood received in culture bottles   Culture   Final    NO GROWTH 2 DAYS Performed at [facility], [address] [place], [place] [address]    Report Status PENDING  Incomplete  MRSA PCR Screening     Status: None   Collection Time: 02/18/18  5:00 AM  Result Value Ref Range Status   MRSA by PCR NEGATIVE NEGATIVE Final    Comment:        The GeneXpert MRSA Assay (FDA approved for NASAL specimens only), is one component of a comprehensive MRSA colonization surveillance program. It is not intended to diagnose MRSA infection nor to guide or monitor treatment for MRSA infections. Performed at [facility], [address], [place], [place] [address]     RADIOLOGY:  Dg Chest 2 View  Result Date: 02/17/2018 CLINICAL DATA:  Shortness of breath. EXAM: CHEST - 2 VIEW COMPARISON:  One-view chest x-ray 09/02/2017. FINDINGS: The heart is mildly enlarged. Aortic atherosclerosis is again seen. Ill-defined left lower lobe airspace disease is present. The right lung is clear. No significant effusions are present. The visualized soft tissues and bony thorax are unremarkable. IMPRESSION: 1. Ill-defined left lower lobe airspace disease raises  concern for pneumonia. 2. Borderline cardiomegaly without failure. 3.  Aortic Atherosclerosis (ICD10-I70.0). Electronically Signed   By: Marin Roberts M.D.   On: 02/17/2018 20:17    EKG:   Orders placed or performed during the hospital encounter of 02/17/18  . ED EKG  . ED EKG  . EKG      Management plans discussed with the patient, family and they are in agreement.  CODE STATUS:     Code Status Orders  (From admission, onward)        Start     Ordered   02/17/18 2251  Do not attempt resuscitation (DNR)  Continuous    Question Answer Comment  In the event of cardiac or respiratory ARREST Do not call a "code blue"   In the event of cardiac or respiratory ARREST  Do not perform Intubation, CPR, defibrillation or ACLS   In the event of cardiac or respiratory ARREST Use medication by any route, position, wound care, and other measures to relive pain and suffering. May use oxygen, suction and manual treatment of airway obstruction as needed for comfort.      02/17/18 2251    Code Status History    Date Active Date Inactive Code Status Order ID Comments User Context   07/12/2015 0645 07/14/2015 2009 DNR [id number]  Arnaldo Natal, MD ED   07/12/2015 0627 07/12/2015 0645 Full Code [id number]  Arnaldo Natal, MD ED   11/07/2013 1004 11/08/2013 1420 DNR [id number]  Fredirick Maudlin, MD Inpatient   11/06/2013 0247 11/07/2013 1004 Full Code [id number]  Haydee Monica, MD Inpatient   11/30/2011 0106 11/30/2011 1958 Full Code [id number]  Joya Gaskins, MD ED    Advance Directive Documentation     Most Recent Value  Type of Advance Directive  Out of facility DNR (pink MOST or yellow form)  Pre-existing out of facility DNR order (yellow form or pink MOST form)  Pink MOST form placed in chart (order not valid for inpatient use)  "MOST" Form in Place?  -      TOTAL TIME TAKING CARE OF THIS PATIENT:  42  minutes.   Note: This dictation was prepared with Dragon dictation along with smaller phrase technology. Any transcriptional errors that result from this process are unintentional.   @[facility] @  on 02/20/2018 at 1:24 PM  Between 7am to 6pm - Pager - [phone]  After 6pm go to [email or web address] - password [email or web address] [facility]  [facility]  Office  [phone]  CC: Primary care physician; William Gula, MD

## 2018-02-20 NOTE — Care Management (Signed)
Faxed refusal, H&P to [place]

## 2018-02-20 NOTE — Care Management Important Message (Signed)
Copy of signed IM left in patient's room.    

## 2018-02-20 NOTE — Progress Notes (Signed)
Pt is being discharged today, report called to [facility]. Discharge packet with prescriptions is ready and placed in the chart. Patient was changed into a clean gown and placed on ems appropriate sheets. The daughter is at bedside and aware of the discharge plans. EMS has been notified of transport needed to [facility].

## 2018-02-20 NOTE — Discharge Instructions (Signed)
Follow-up with primary care physician at [facility] Dr. Antonietta Barcelona as scheduled on May 20 Follow-up with primary care physician at the facility in 5 days  Dysphagia 3 diet  continue 2 L of oxygen via nasal cannula Patient is total care, bedbound

## 2018-02-20 NOTE — Care Management (Signed)
Spoke with Lorrie at [facility]. Also spoke with daughter, Carleene Overlie. Mrs. [name] has refused transfer to the [place]. Lorrie updated. Spoke with Dr. Amado Coe, case discussed and it is anticipated patient will be discharge back to [facility] today if BP stable. Updated daughter. Daughter will be in around 12 noon. Will have her sign refusal to transfer form and fax to [place].

## 2018-02-21 LAB — CULTURE, BLOOD (ROUTINE X 2)

## 2018-02-23 LAB — CULTURE, BLOOD (ROUTINE X 2)
CULTURE: NO GROWTH
CULTURE: NO GROWTH
SPECIAL REQUESTS: ADEQUATE

## 2018-10-30 IMAGING — CR DG CHEST 2V
1 series · 3 of 3 positions shown · non-contrast
Comparison: One-view chest x-ray 09/02/2017.

CLINICAL DATA: Shortness of breath.

EXAM:
CHEST - 2 VIEW

[Series 1: dg chest 2 view · 0.14mm/px · 3 of 3 slices shown]
[im 1/3]
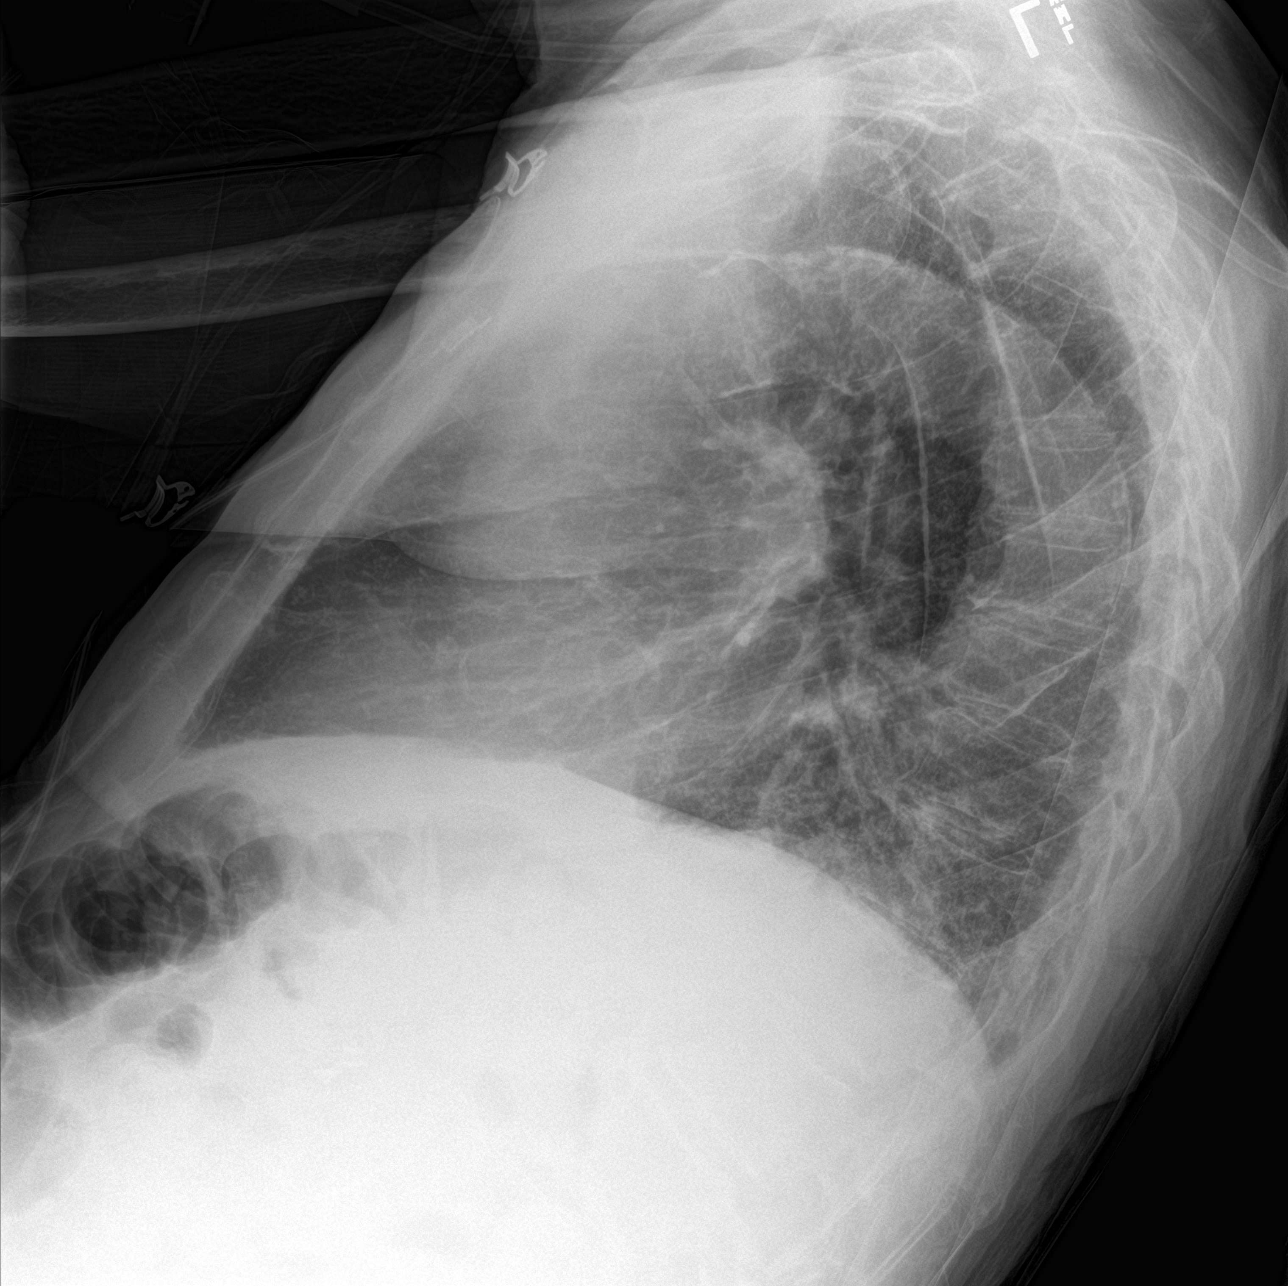
[im 2/3]
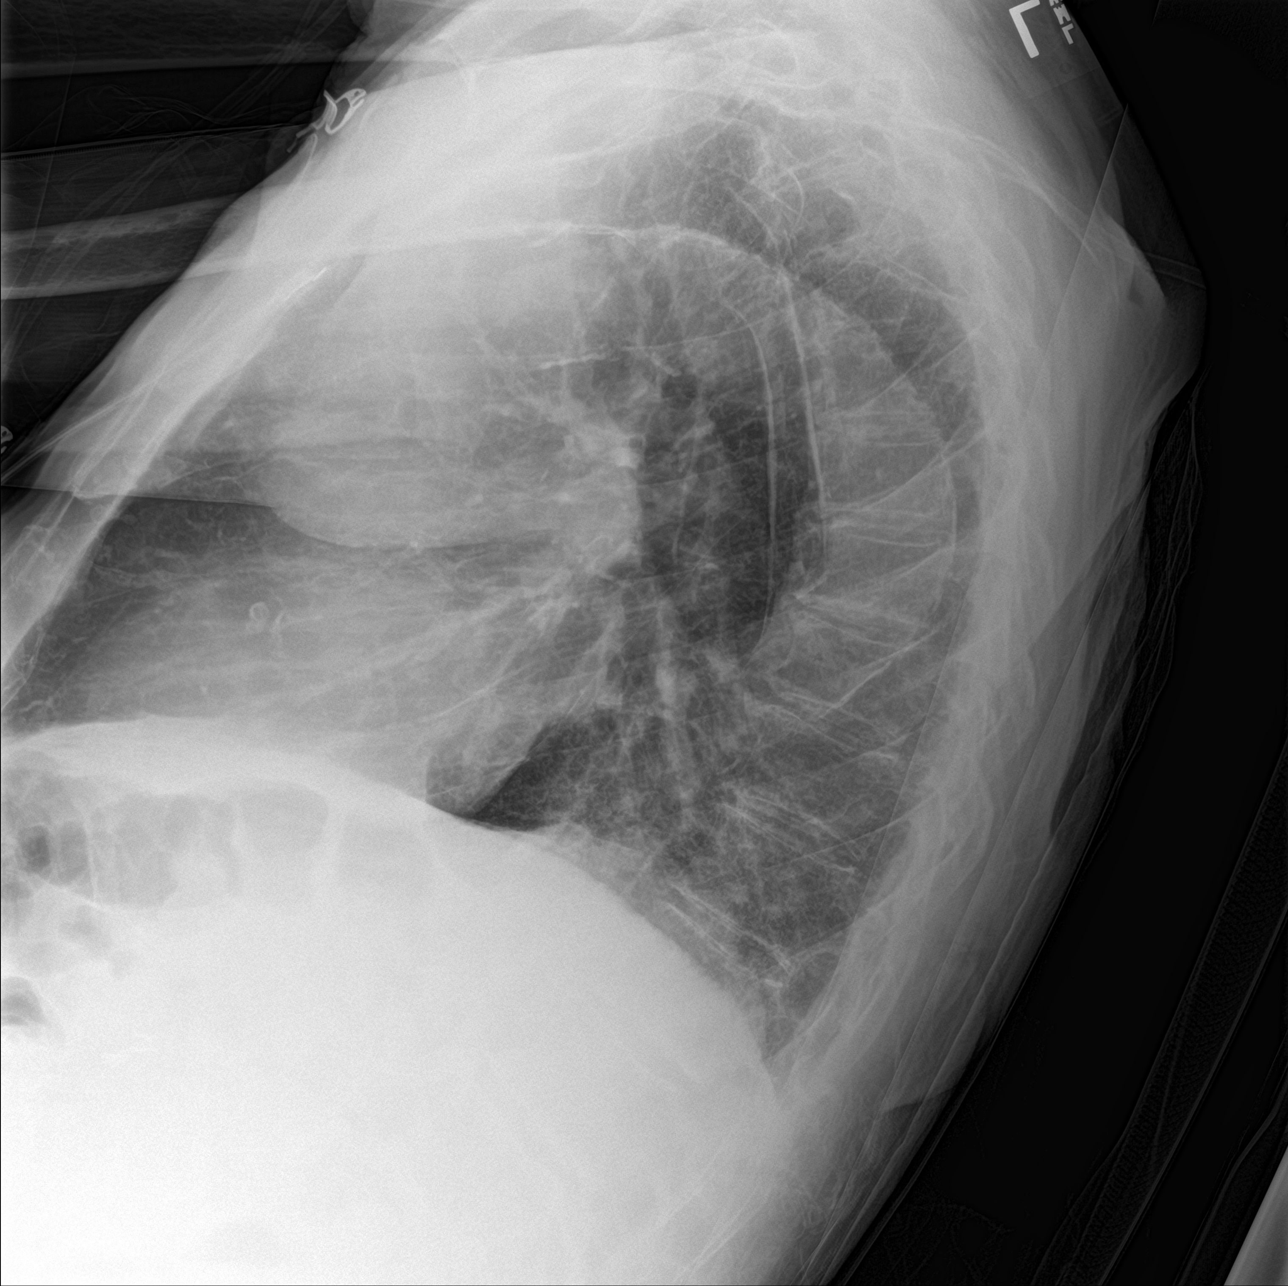
[im 3/3]
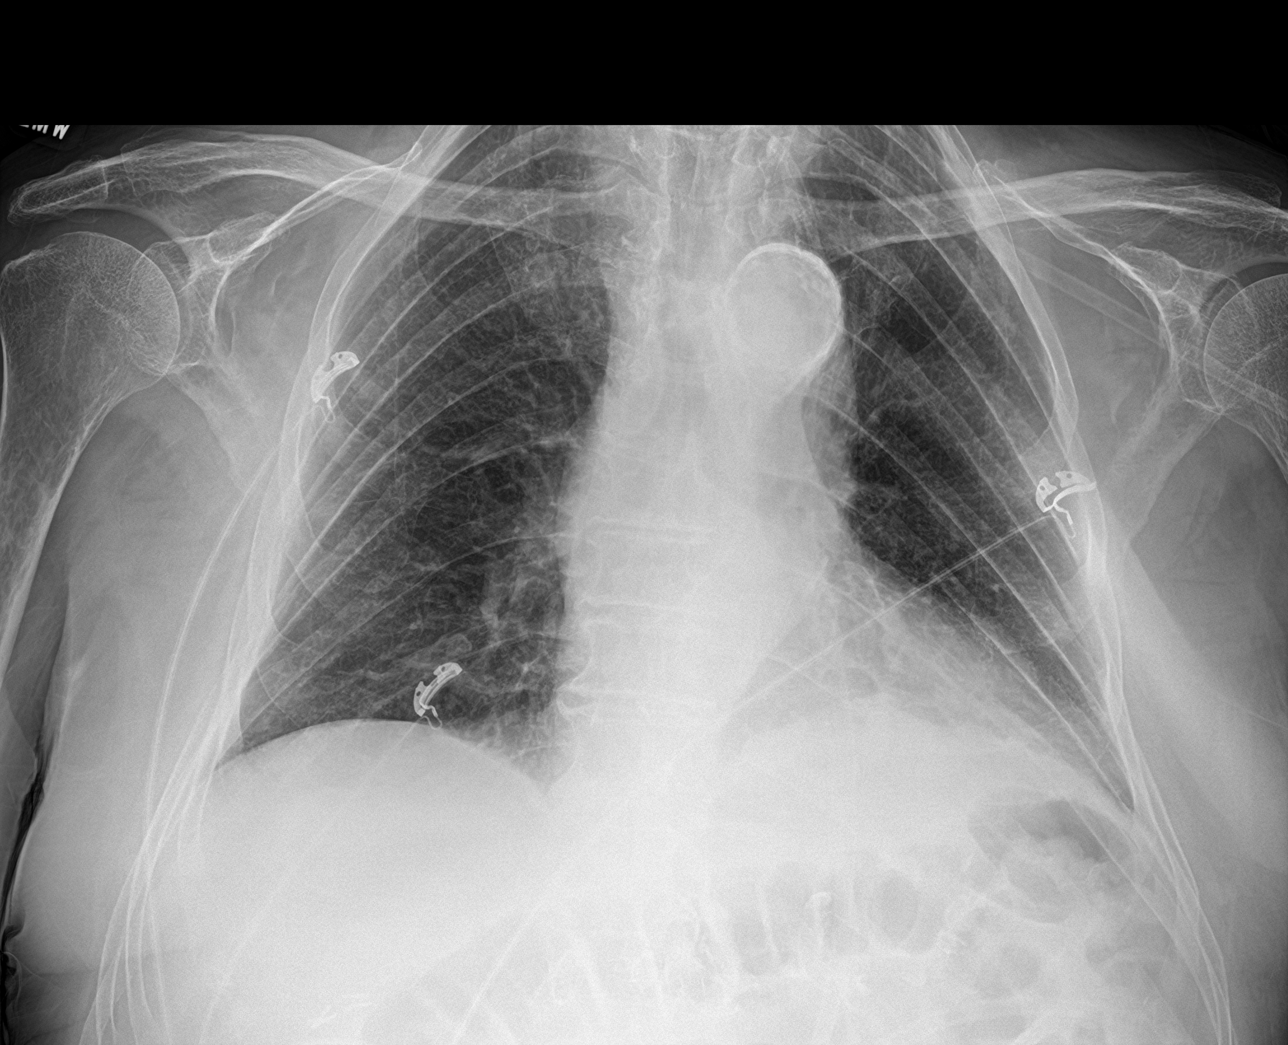

[3 of 3 positions shown; findings below may reference images not displayed]

FINDINGS: The heart is mildly enlarged. Aortic atherosclerosis is again seen.
Ill-defined left lower lobe airspace disease is present. The right
lung is clear. No significant effusions are present. The visualized
soft tissues and bony thorax are unremarkable.
IMPRESSION: 1. Ill-defined left lower lobe airspace disease raises concern for
pneumonia.
2. Borderline cardiomegaly without failure.
3.  Aortic Atherosclerosis ([id number]).

## 2019-07-27 ENCOUNTER — Other Ambulatory Visit: Payer: Self-pay

## 2019-07-27 ENCOUNTER — Inpatient Hospital Stay
Admission: EM | Admit: 2019-07-27 | Discharge: 2019-07-30 | DRG: 177 | Disposition: A | Discharge status: Skilled Nursing Facility | Payer: No Typology Code available for payment source | Source: Skilled Nursing Facility | Attending: Internal Medicine | Admitting: Internal Medicine

## 2019-07-27 ENCOUNTER — Emergency Department: Payer: No Typology Code available for payment source

## 2019-07-27 DIAGNOSIS — Z7401 Bed confinement status: Secondary | ICD-10-CM | POA: Diagnosis not present

## 2019-07-27 DIAGNOSIS — Z7189 Other specified counseling: Secondary | ICD-10-CM | POA: Diagnosis not present

## 2019-07-27 DIAGNOSIS — Z66 Do not resuscitate: Secondary | ICD-10-CM | POA: Diagnosis present

## 2019-07-27 DIAGNOSIS — Z9181 History of falling: Secondary | ICD-10-CM | POA: Diagnosis not present

## 2019-07-27 DIAGNOSIS — F4310 Post-traumatic stress disorder, unspecified: Secondary | ICD-10-CM | POA: Diagnosis present

## 2019-07-27 DIAGNOSIS — L8990 Pressure ulcer of unspecified site, unspecified stage: Secondary | ICD-10-CM | POA: Insufficient documentation

## 2019-07-27 DIAGNOSIS — Z79891 Long term (current) use of opiate analgesic: Secondary | ICD-10-CM

## 2019-07-27 DIAGNOSIS — F329 Major depressive disorder, single episode, unspecified: Secondary | ICD-10-CM | POA: Diagnosis present

## 2019-07-27 DIAGNOSIS — F0150 Vascular dementia without behavioral disturbance: Secondary | ICD-10-CM | POA: Diagnosis present

## 2019-07-27 DIAGNOSIS — I69319 Unspecified symptoms and signs involving cognitive functions following cerebral infarction: Secondary | ICD-10-CM

## 2019-07-27 DIAGNOSIS — R0602 Shortness of breath: Secondary | ICD-10-CM | POA: Diagnosis present

## 2019-07-27 DIAGNOSIS — D696 Thrombocytopenia, unspecified: Secondary | ICD-10-CM | POA: Diagnosis present

## 2019-07-27 DIAGNOSIS — A419 Sepsis, unspecified organism: Secondary | ICD-10-CM

## 2019-07-27 DIAGNOSIS — Z888 Allergy status to other drugs, medicaments and biological substances status: Secondary | ICD-10-CM

## 2019-07-27 DIAGNOSIS — R32 Unspecified urinary incontinence: Secondary | ICD-10-CM | POA: Diagnosis present

## 2019-07-27 DIAGNOSIS — E860 Dehydration: Secondary | ICD-10-CM | POA: Diagnosis present

## 2019-07-27 DIAGNOSIS — J449 Chronic obstructive pulmonary disease, unspecified: Secondary | ICD-10-CM | POA: Diagnosis present

## 2019-07-27 DIAGNOSIS — J690 Pneumonitis due to inhalation of food and vomit: Principal | ICD-10-CM | POA: Diagnosis present

## 2019-07-27 DIAGNOSIS — Z8781 Personal history of (healed) traumatic fracture: Secondary | ICD-10-CM

## 2019-07-27 DIAGNOSIS — Z7982 Long term (current) use of aspirin: Secondary | ICD-10-CM | POA: Diagnosis not present

## 2019-07-27 DIAGNOSIS — L988 Other specified disorders of the skin and subcutaneous tissue: Secondary | ICD-10-CM | POA: Diagnosis present

## 2019-07-27 DIAGNOSIS — J189 Pneumonia, unspecified organism: Secondary | ICD-10-CM | POA: Diagnosis present

## 2019-07-27 DIAGNOSIS — Z515 Encounter for palliative care: Secondary | ICD-10-CM

## 2019-07-27 DIAGNOSIS — J9601 Acute respiratory failure with hypoxia: Secondary | ICD-10-CM | POA: Diagnosis present

## 2019-07-27 DIAGNOSIS — Z79890 Hormone replacement therapy (postmenopausal): Secondary | ICD-10-CM

## 2019-07-27 DIAGNOSIS — Z20828 Contact with and (suspected) exposure to other viral communicable diseases: Secondary | ICD-10-CM | POA: Diagnosis present

## 2019-07-27 DIAGNOSIS — Z880 Allergy status to penicillin: Secondary | ICD-10-CM

## 2019-07-27 DIAGNOSIS — Z886 Allergy status to analgesic agent status: Secondary | ICD-10-CM

## 2019-07-27 DIAGNOSIS — E1165 Type 2 diabetes mellitus with hyperglycemia: Secondary | ICD-10-CM | POA: Diagnosis present

## 2019-07-27 DIAGNOSIS — D638 Anemia in other chronic diseases classified elsewhere: Secondary | ICD-10-CM | POA: Diagnosis present

## 2019-07-27 DIAGNOSIS — Z96641 Presence of right artificial hip joint: Secondary | ICD-10-CM | POA: Diagnosis present

## 2019-07-27 DIAGNOSIS — Z8619 Personal history of other infectious and parasitic diseases: Secondary | ICD-10-CM | POA: Diagnosis not present

## 2019-07-27 DIAGNOSIS — Z794 Long term (current) use of insulin: Secondary | ICD-10-CM | POA: Diagnosis not present

## 2019-07-27 DIAGNOSIS — Z883 Allergy status to other anti-infective agents status: Secondary | ICD-10-CM

## 2019-07-27 DIAGNOSIS — Z91041 Radiographic dye allergy status: Secondary | ICD-10-CM

## 2019-07-27 DIAGNOSIS — Z79899 Other long term (current) drug therapy: Secondary | ICD-10-CM

## 2019-07-27 DIAGNOSIS — Z882 Allergy status to sulfonamides status: Secondary | ICD-10-CM

## 2019-07-27 DIAGNOSIS — I10 Essential (primary) hypertension: Secondary | ICD-10-CM | POA: Diagnosis present

## 2019-07-27 LAB — URINALYSIS, COMPLETE (UACMP) WITH MICROSCOPIC
Bacteria, UA: NONE SEEN
Bilirubin Urine: NEGATIVE
Glucose, UA: NEGATIVE mg/dL
Hgb urine dipstick: NEGATIVE
Ketones, ur: NEGATIVE mg/dL
Leukocytes,Ua: NEGATIVE
Nitrite: NEGATIVE
Protein, ur: 30 mg/dL — AB
Specific Gravity, Urine: 1.02 (ref 1.005–1.030)
Squamous Epithelial / LPF: NONE SEEN (ref 0–5)
pH: 5 (ref 5.0–8.0)

## 2019-07-27 LAB — CBC WITH DIFFERENTIAL/PLATELET
Abs Immature Granulocytes: 0.04 10*3/uL (ref 0.00–0.07)
Basophils Absolute: 0 10*3/uL (ref 0.0–0.1)
Basophils Relative: 0 %
Eosinophils Absolute: 0.1 10*3/uL (ref 0.0–0.5)
Eosinophils Relative: 1 %
HCT: 27.5 % — ABNORMAL LOW (ref 39.0–52.0)
Hemoglobin: 8.6 g/dL — ABNORMAL LOW (ref 13.0–17.0)
Immature Granulocytes: 0 %
Lymphocytes Relative: 16 %
Lymphs Abs: 1.6 10*3/uL (ref 0.7–4.0)
MCH: 28.9 pg (ref 26.0–34.0)
MCHC: 31.3 g/dL (ref 30.0–36.0)
MCV: 92.3 fL (ref 80.0–100.0)
Monocytes Absolute: 0.7 10*3/uL (ref 0.1–1.0)
Monocytes Relative: 7 %
Neutro Abs: 7.3 10*3/uL (ref 1.7–7.7)
Neutrophils Relative %: 76 %
Platelets: 76 10*3/uL — ABNORMAL LOW (ref 150–400)
RBC: 2.98 MIL/uL — ABNORMAL LOW (ref 4.22–5.81)
RDW: 18.5 % — ABNORMAL HIGH (ref 11.5–15.5)
WBC: 9.8 10*3/uL (ref 4.0–10.5)
nRBC: 0 % (ref 0.0–0.2)

## 2019-07-27 LAB — COMPREHENSIVE METABOLIC PANEL
ALT: 51 U/L — ABNORMAL HIGH (ref 0–44)
AST: 43 U/L — ABNORMAL HIGH (ref 15–41)
Albumin: 2.8 g/dL — ABNORMAL LOW (ref 3.5–5.0)
Alkaline Phosphatase: 190 U/L — ABNORMAL HIGH (ref 38–126)
Anion gap: 8 (ref 5–15)
BUN: 72 mg/dL — ABNORMAL HIGH (ref 8–23)
CO2: 24 mmol/L (ref 22–32)
Calcium: 9.1 mg/dL (ref 8.9–10.3)
Chloride: 108 mmol/L (ref 98–111)
Creatinine, Ser: 1.12 mg/dL (ref 0.61–1.24)
GFR calc Af Amer: >60 mL/min (ref >60)
GFR calc non Af Amer: 60 mL/min — ABNORMAL LOW (ref >60)
Glucose, Bld: 280 mg/dL — ABNORMAL HIGH (ref 70–99)
Potassium: 5 mmol/L (ref 3.5–5.1)
Sodium: 140 mmol/L (ref 135–145)
Total Bilirubin: 1.1 mg/dL (ref 0.3–1.2)
Total Protein: 6.8 g/dL (ref 6.5–8.1)

## 2019-07-27 LAB — BRAIN NATRIURETIC PEPTIDE: B Natriuretic Peptide: 1059 pg/mL — ABNORMAL HIGH (ref 0.0–100.0)

## 2019-07-27 LAB — TROPONIN I (HIGH SENSITIVITY)
Troponin I (High Sensitivity): 275 ng/L (ref <18)
Troponin I (High Sensitivity): 268 ng/L (ref <18)

## 2019-07-27 LAB — HEMOGLOBIN A1C
Hgb A1c MFr Bld: 7.6 % — ABNORMAL HIGH (ref 4.8–5.6)
Mean Plasma Glucose: 171.42 mg/dL

## 2019-07-27 LAB — LACTIC ACID, PLASMA
Lactic Acid, Venous: 2 mmol/L (ref 0.5–1.9)
Lactic Acid, Venous: 1.8 mmol/L (ref 0.5–1.9)

## 2019-07-27 LAB — GLUCOSE, CAPILLARY: Glucose-Capillary: 282 mg/dL — ABNORMAL HIGH (ref 70–99)

## 2019-07-27 LAB — MRSA PCR SCREENING: MRSA by PCR: NEGATIVE

## 2019-07-27 LAB — SARS CORONAVIRUS 2 BY RT PCR (HOSPITAL ORDER, PERFORMED IN ~~LOC~~ HOSPITAL LAB): SARS Coronavirus 2: NEGATIVE

## 2019-07-27 LAB — MAGNESIUM: Magnesium: 2.2 mg/dL (ref 1.7–2.4)

## 2019-07-27 MED ORDER — POLYETHYLENE GLYCOL 3350 17 G PO PACK
17.0000 g | PACK | Freq: Every evening | ORAL | Status: DC
  Administered 2019-07-27 – 2019-07-28 (×2): 17 g via ORAL
  Filled 2019-07-27 (×2): qty 1

## 2019-07-27 MED ORDER — GUAIFENESIN-DM 100-10 MG/5ML PO SYRP
5.0000 mL | ORAL_SOLUTION | ORAL | Status: DC | PRN
  Filled 2019-07-27: qty 5

## 2019-07-27 MED ORDER — ALBUTEROL SULFATE (2.5 MG/3ML) 0.083% IN NEBU: 2.5000 mg | INHALATION_SOLUTION | RESPIRATORY_TRACT | Status: DC | PRN

## 2019-07-27 MED ORDER — ALBUTEROL SULFATE (2.5 MG/3ML) 0.083% IN NEBU: 5.0000 mg | INHALATION_SOLUTION | Freq: Once | RESPIRATORY_TRACT | Status: DC

## 2019-07-27 MED ORDER — GABAPENTIN 300 MG PO CAPS
300.0000 mg | ORAL_CAPSULE | Freq: Two times a day (BID) | ORAL | Status: DC
  Administered 2019-07-27 – 2019-07-30 (×6): 300 mg via ORAL
  Filled 2019-07-27 (×6): qty 1

## 2019-07-27 MED ORDER — ATORVASTATIN CALCIUM 20 MG PO TABS
40.0000 mg | ORAL_TABLET | Freq: Every day | ORAL | Status: DC
  Administered 2019-07-27 – 2019-07-28 (×2): 40 mg via ORAL
  Filled 2019-07-27 (×2): qty 2

## 2019-07-27 MED ORDER — INSULIN ASPART 100 UNIT/ML ~~LOC~~ SOLN
0.0000 [IU] | Freq: Three times a day (TID) | SUBCUTANEOUS | Status: DC
  Administered 2019-07-28: 13:00:00 5 [IU] via SUBCUTANEOUS
  Administered 2019-07-28: 17:00:00 7 [IU] via SUBCUTANEOUS
  Administered 2019-07-28 – 2019-07-29 (×2): 5 [IU] via SUBCUTANEOUS
  Filled 2019-07-27 (×4): qty 1

## 2019-07-27 MED ORDER — SENNOSIDES-DOCUSATE SODIUM 8.6-50 MG PO TABS: 1.0000 | ORAL_TABLET | Freq: Every evening | ORAL | Status: DC | PRN

## 2019-07-27 MED ORDER — MELATONIN 5 MG PO TABS
5.0000 mg | ORAL_TABLET | Freq: Every day | ORAL | Status: DC
  Administered 2019-07-28: 5 mg via ORAL
  Filled 2019-07-27 (×4): qty 1

## 2019-07-27 MED ORDER — SODIUM CHLORIDE 0.9 % IV SOLN
2.0000 g | Freq: Once | INTRAVENOUS | Status: AC
  Administered 2019-07-27: 2 g via INTRAVENOUS
  Filled 2019-07-27: qty 2

## 2019-07-27 MED ORDER — VANCOMYCIN HCL IN DEXTROSE 1-5 GM/200ML-% IV SOLN
1000.0000 mg | Freq: Once | INTRAVENOUS | Status: AC
  Administered 2019-07-27: 1000 mg via INTRAVENOUS
  Filled 2019-07-27: qty 200

## 2019-07-27 MED ORDER — SODIUM CHLORIDE 0.9 % IV SOLN
2.0000 g | Freq: Two times a day (BID) | INTRAVENOUS | Status: DC
  Administered 2019-07-27 – 2019-07-28 (×2): 2 g via INTRAVENOUS
  Filled 2019-07-27 (×3): qty 2

## 2019-07-27 MED ORDER — FERROUS SULFATE 325 (65 FE) MG PO TABS
325.0000 mg | ORAL_TABLET | ORAL | Status: DC
  Administered 2019-07-28: 325 mg via ORAL
  Filled 2019-07-27: qty 1

## 2019-07-27 MED ORDER — ENOXAPARIN SODIUM 40 MG/0.4ML ~~LOC~~ SOLN
40.0000 mg | SUBCUTANEOUS | Status: DC
  Administered 2019-07-27 – 2019-07-28 (×2): 40 mg via SUBCUTANEOUS
  Filled 2019-07-27 (×2): qty 0.4

## 2019-07-27 MED ORDER — PANTOPRAZOLE SODIUM 40 MG PO TBEC
40.0000 mg | DELAYED_RELEASE_TABLET | Freq: Two times a day (BID) | ORAL | Status: DC
  Administered 2019-07-28 – 2019-07-29 (×3): 40 mg via ORAL
  Filled 2019-07-27 (×3): qty 1

## 2019-07-27 MED ORDER — MUPIROCIN 2 % EX OINT
1.0000 "application " | TOPICAL_OINTMENT | Freq: Two times a day (BID) | CUTANEOUS | Status: DC
  Administered 2019-07-27 – 2019-07-29 (×4): 1 via NASAL
  Filled 2019-07-27: qty 22

## 2019-07-27 MED ORDER — SODIUM CHLORIDE 0.9 % IV SOLN
INTRAVENOUS | Status: DC
  Administered 2019-07-27: 16:00:00 via INTRAVENOUS

## 2019-07-27 MED ORDER — INSULIN ASPART 100 UNIT/ML ~~LOC~~ SOLN
0.0000 [IU] | Freq: Every day | SUBCUTANEOUS | Status: DC
  Administered 2019-07-27 – 2019-07-28 (×2): 3 [IU] via SUBCUTANEOUS
  Filled 2019-07-27 (×2): qty 1

## 2019-07-27 MED ORDER — ASPIRIN EC 81 MG PO TBEC
81.0000 mg | DELAYED_RELEASE_TABLET | Freq: Every day | ORAL | Status: DC
  Administered 2019-07-28 – 2019-07-29 (×2): 81 mg via ORAL
  Filled 2019-07-27 (×2): qty 1

## 2019-07-27 MED ORDER — VENLAFAXINE HCL ER 75 MG PO CP24
150.0000 mg | ORAL_CAPSULE | Freq: Every day | ORAL | Status: DC
  Administered 2019-07-28 – 2019-07-30 (×3): 150 mg via ORAL
  Filled 2019-07-27 (×3): qty 2

## 2019-07-27 MED ORDER — TAMSULOSIN HCL 0.4 MG PO CAPS
0.4000 mg | ORAL_CAPSULE | Freq: Every day | ORAL | Status: DC
  Administered 2019-07-28 – 2019-07-30 (×3): 0.4 mg via ORAL
  Filled 2019-07-27 (×3): qty 1

## 2019-07-27 MED ORDER — SODIUM CHLORIDE 0.9 % IV BOLUS
1000.0000 mL | Freq: Once | INTRAVENOUS | Status: AC
  Administered 2019-07-27: 1000 mL via INTRAVENOUS

## 2019-07-27 MED ORDER — SENNOSIDES-DOCUSATE SODIUM 8.6-50 MG PO TABS
2.0000 | ORAL_TABLET | Freq: Two times a day (BID) | ORAL | Status: DC
  Administered 2019-07-27 – 2019-07-30 (×6): 2 via ORAL
  Filled 2019-07-27 (×6): qty 2

## 2019-07-27 MED ORDER — MAGNESIUM OXIDE 400 (241.3 MG) MG PO TABS
400.0000 mg | ORAL_TABLET | Freq: Every day | ORAL | Status: DC
  Administered 2019-07-28 – 2019-07-29 (×2): 400 mg via ORAL
  Filled 2019-07-27 (×2): qty 1

## 2019-07-27 NOTE — ED Notes (Signed)
Pt's sister at bedside. Annie Main, RN gave pt water. Pt and pt sister updated on POC from EDP. Pt and pt's sister told that bed request is in but explained that it may take some time due to bed shortage. Pt denies any further needs at this time. Will continue to monitor.

## 2019-07-27 NOTE — ED Notes (Signed)
This RN gave rolling call, this RN and Deneise Lever, RN on the way with patient.

## 2019-07-27 NOTE — ED Notes (Signed)
Date and time results received: 07/27/19 11:59 AM  (use smartphrase ".now" to insert current time)  Test: Troponin Critical Value: 275  Name of Provider Notified: Dr. Cherylann Banas  Orders Received? Or Actions Taken?: No new orders

## 2019-07-27 NOTE — ED Notes (Addendum)
This RN gave pt medication per MD orders. Upon entering the room pt seemed less alert and WOB was increased. Pt awakens too loud verbal stimuli. MD aware. Discussed with MD fluid administration due to elevated BNP. Per MD fluids d/c at 652mL given. No new orders. Will continue to monitor.

## 2019-07-27 NOTE — Progress Notes (Signed)
Pharmacy Antibiotic Note  [name] is a 85 y.o. male admitted on 07/27/2019. Pharmacy has been consulted for cefepime dosing for pneumonia.  Plan: Cefepime 2 g IV q12h  Height: 6\' 3"  (190.5 cm) Weight: 180 lb (81.6 kg) IBW/kg (Calculated) : 84.5  Temp (24hrs), Avg:100.1 F (37.8 C), Min:100.1 F (37.8 C), Max:100.1 F (37.8 C)  Recent Labs  Lab 07/27/19 1106  WBC 9.8  CREATININE 1.12  LATICACIDVEN 1.8  2.0*    Estimated Creatinine Clearance: 56.7 mL/min (by C-G formula based on SCr of 1.12 mg/dL).    Allergies  Allergen Reactions  . Yellow Jacket Venom Anaphylaxis  . Aggrenox [Aspirin-Dipyridamole Er] Other (See Comments)    Reaction: unknown  . Bactrim [Sulfamethoxazole-Trimethoprim] Other (See Comments)    Reaction: unknown  . Betadine Prepstick Plus Other (See Comments)    Reaction: unknown  . Contrast Media [Iodinated Diagnostic Agents] Other (See Comments)    Reaction: unknown. Patient states that he does not remember the reaction, but it happened at the [place]  . Cortisone Other (See Comments)    Reaction: unknown  . Penicillins Other (See Comments)    Reaction: unknown Has patient had a PCN reaction causing immediate rash, facial/tongue/throat swelling, SOB or lightheadedness with hypotension: Unknown Has patient had a PCN reaction causing severe rash involving mucus membranes or skin necrosis: Unknown Has patient had a PCN reaction that required hospitalization: Unknown Has patient had a PCN reaction occurring within the last 10 years: Unknown If all of the above answers are "NO", then may proceed with Cephalosporin use.  . Salsalate Other (See Comments)    Reaction: unknown  . Sulfa Antibiotics Other (See Comments)    Reaction: unknown  . Tegretol [Carbamazepine] Other (See Comments)    Reaction: unknown    Antimicrobials this admission: Vancomycin 10/13 x 1 Cefepime 10/13 >>  Dose adjustments this admission: NA  Microbiology results: 10/13 BCx:  pending 10/13 SARS Coronavirus 2: negative  Thank you for allowing pharmacy to be a part of this patient's care.   Tawnya Crook, PharmD 07/27/2019 2:21 PM

## 2019-07-27 NOTE — ED Provider Notes (Signed)
[facility] Emergency Department Provider Note ____________________________________________   First MD Initiated Contact with Patient 07/27/19 1352     (approximate)  I have reviewed the triage vital signs and the nursing notes.   HISTORY  Chief Complaint Shortness of Breath  Level 5 caveat: History of present illness limited due to dementia and possible altered mental status  HPI [name] is a 85 y.o. male with PMH as noted below who presents from his facility with apparent shortness of breath for several days.  Per EMS, the O2 saturation was in the 70s on room air although it went up to the 90s on nasal cannula.  The patient states that he feels weak, but cannot really give much other history.  Past Medical History:  Diagnosis Date  . COPD (chronic obstructive pulmonary disease) (HCC)   . CVA (cerebral infarction)   . Diabetes mellitus   . GI bleed   . Hypertension   . MDD (major depressive disorder)    w/ paranoia  . PTSD (post-traumatic stress disorder)   . Vascular dementia [facility](HCC)     Patient Active Problem List   Diagnosis Date Noted  . Sepsis (HCC) 02/17/2018  . Hypoxia 07/12/2015  . Pressure ulcer 07/12/2015  . Fall at nursing home 11/06/2013  . Hip fracture, right (HCC) 11/06/2013  . Vascular dementia (HCC)   . Hypertension   . Muscle weakness (generalized) 06/11/2011  . Difficulty in walking(719.7) 06/11/2011    Past Surgical History:  Procedure Laterality Date  . TOTAL HIP ARTHROPLASTY Right     Prior to Admission medications   Medication Sig Start Date End Date Taking? Authorizing Provider  acetaminophen (TYLENOL) 325 MG tablet Take 650 mg by mouth every 8 (eight) hours.     [provider]  albuterol (PROVENTIL HFA;VENTOLIN HFA) 108 (90 Base) MCG/ACT inhaler Inhale 1 puff into the lungs every 6 (six) hours as needed for shortness of breath.     [provider]  ARIPiprazole (ABILIFY) 5 MG tablet Take 5  mg by mouth daily.    [provider]  aspirin EC 81 MG tablet Take 81 mg by mouth daily.    [provider]  atorvastatin (LIPITOR) 40 MG tablet Take 40 mg by mouth at bedtime.     [provider]  carbamide peroxide (DEBROX) 6.5 % otic solution Place 5 drops into both ears 2 (two) times daily as needed (cerum impaction).     [provider]  cefdinir (OMNICEF) 300 MG capsule Take 1 capsule (300 mg total) by mouth 2 (two) times daily. 02/20/18   Ramonita LabGouru, Aruna, MD  docusate sodium 100 MG CAPS Take 100 mg by mouth daily. Patient not taking: Reported on 09/02/2017 11/07/13   Kari BaarsHawkins, Edward, MD  Emollient [facility](MINERIN) LOTN Apply 1 application topically daily. Apply to face    [provider]  gabapentin (NEURONTIN) 300 MG capsule Take 300 mg by mouth 2 (two) times daily.    [provider]  HYDROcodone-acetaminophen (NORCO/VICODIN) 5-325 MG tablet Take 1 tablet by mouth every 6 (six) hours as needed for moderate pain. 02/20/18   Gouru, Deanna ArtisAruna, MD  insulin aspart (NOVOLOG) 100 UNIT/ML injection Inject 0-5 Units into the skin at bedtime. Patient not taking: Reported on 09/02/2017 11/07/13   Kari BaarsHawkins, Edward, MD  insulin aspart (NOVOLOG) 100 UNIT/ML injection Inject 0-9 Units into the skin 3 (three) times daily with meals. CBG < 70: implement hypoglycemia protocol CBG 70 - 120: 0 units CBG 121 -  150: 1 unit CBG 151 - 200: 2 units CBG 201 - 250: 3 units CBG 251 - 300: 5 units CBG 301 - 350: 7 units CBG 351 - 400: 9 units CBG > 400: call MD and obtain STAT lab verification, Do NOT hold if patient is NPO. Sensitive Scale. 02/20/18   Gouru, Deanna Artis, MD  ketoconazole (NIZORAL) 2 % shampoo Apply 1 application topically as directed. Apply to wet scalp and face twice weekly as needed for dandruff/ flakey skin    [provider]  magnesium oxide (MAG-OX) 400 (241.3 MG) MG tablet Take 2 tablets (800 mg total) by mouth daily. Patient taking differently: Take  800 mg by mouth 3 (three) times daily.  11/07/13   Kari Baars, MD  Menthol-Methyl Salicylate [facility] GREASELESS EX) Apply 1 application topically as needed (pain).    [provider]  metFORMIN (GLUCOPHAGE) 1000 MG tablet Take 1,000 mg by mouth 2 (two) times daily with a meal.     [provider]  Multiple Vitamin (MULTIVITAMIN WITH MINERALS) TABS tablet Take 1 tablet by mouth daily.    [provider]  nitroGLYCERIN (NITROSTAT) 0.4 MG SL tablet Place 1 tablet (0.4 mg total) under the tongue every 5 (five) minutes as needed for chest pain. 11/07/13   Kari Baars, MD  nystatin (MYCOSTATIN/NYSTOP) powder Apply topically 3 (three) times daily. To affected penile area 02/20/18   Gouru, Deanna Artis, MD  pantoprazole (PROTONIX) 40 MG tablet Take 1 tablet (40 mg total) by mouth daily. 11/07/13   Kari Baars, MD  polyethylene glycol [facility] Ethelene Hal) packet Take 17 g by mouth 2 (two) times daily as needed for mild constipation.     [provider]  predniSONE (STERAPRED UNI-PAK 21 TAB) 10 MG (21) TBPK tablet Take 1 tablet (10 mg total) by mouth daily. Take 6 tablets by mouth for 1 day followed by  5 tablets by mouth for 1 day followed by  4 tablets by mouth for 1 day followed by  3 tablets by mouth for 1 day followed by  2 tablets by mouth for 1 day followed by  1 tablet by mouth for a day and stop 02/20/18   Ramonita Lab, MD  protein supplement shake (PREMIER PROTEIN) LIQD Take 325 mLs (11 oz total) by mouth 3 (three) times daily between meals. 02/20/18   Ramonita Lab, MD  saccharomyces boulardii (FLORASTOR) 250 MG capsule Take 1 capsule (250 mg total) by mouth 2 (two) times daily. 02/20/18   Gouru, Deanna Artis, MD  sennosides-docusate sodium (SENOKOT-S) 8.6-50 MG tablet Take 1 tablet by mouth 2 (two) times daily.     [provider]  tamsulosin (FLOMAX) 0.4 MG CAPS capsule Take 0.4 mg by mouth at bedtime.     [provider]  tiotropium (SPIRIVA) 18 MCG  inhalation capsule Place 1 capsule (18 mcg total) into inhaler and inhale daily. Patient not taking: Reported on 09/02/2017 07/14/15   Ramonita Lab, MD  umeclidinium bromide (INCRUSE ELLIPTA) 62.5 MCG/INH AEPB Inhale 1 puff into the lungs daily.    [provider]  venlafaxine XR (EFFEXOR-XR) 150 MG 24 hr capsule Take 1 capsule (150 mg total) by mouth daily with breakfast. 11/07/13   Kari Baars, MD  white petrolatum (VASELINE) GEL Apply 1 application topically 3 (three) times daily as needed (nose bleeds). Apply to bilateral nares    [provider]    Allergies Yellow jacket venom, Aggrenox [aspirin-dipyridamole er], Bactrim [sulfamethoxazole-trimethoprim], Betadine prepstick plus, Contrast media [iodinated diagnostic agents], Cortisone,  Penicillins, Salsalate, Sulfa antibiotics, and Tegretol [carbamazepine]  History reviewed. No pertinent family history.  Social History Social History   Tobacco Use  . Smoking status: Never Smoker  . Smokeless tobacco: Never Used  Substance Use Topics  . Alcohol use: No  . Drug use: No    Review of Systems Level 5 caveat: Unable to obtain review of systems due to dementia and possible altered mental status    ____________________________________________   PHYSICAL EXAM:  VITAL SIGNS: ED Triage Vitals  Enc Vitals Group     BP 07/27/19 1058 106/64     Pulse Rate 07/27/19 1058 94     Resp 07/27/19 1058 (!) 27     Temp 07/27/19 1112 100.1 F (37.8 C)     Temp Source 07/27/19 1112 Rectal     SpO2 07/27/19 1053 97 %     Weight 07/27/19 1100 180 lb (81.6 kg)     Height 07/27/19 1100  (1.905 m)     Head Circumference --      Peak Flow --      Pain Score 07/27/19 1100 0     Pain Loc --      Pain Edu? --      Excl. in GC? --     Constitutional: Alert, somewhat confused.  Weak appearing. Eyes: Conjunctivae are normal.  EOMI.  PERRLA. Head: Atraumatic. Nose: No congestion/rhinnorhea. Mouth/Throat: Mucous  membranes are dry.   Neck: Normal range of motion.  Cardiovascular: Normal rate, regular rhythm. Grossly normal heart sounds.  Good peripheral circulation. Respiratory: Normal respiratory effort.  No retractions.  Decreased breath sounds bilaterally with some scattered rales. Gastrointestinal: Soft and nontender. No distention.  Genitourinary: No flank tenderness.  Normal external genitalia. Musculoskeletal:Extremities warm and well perfused.  Neurologic:  Motor intact in all extremities. Skin:  Skin is warm and dry. No rash noted. Psychiatric: Calm and cooperative.  ____________________________________________   LABS (all labs ordered are listed, but only abnormal results are displayed)  Labs Reviewed  COMPREHENSIVE METABOLIC PANEL - Abnormal; Notable for the following components:      Result Value   Glucose, Bld 280 (*)    BUN 72 (*)    Albumin 2.8 (*)    AST 43 (*)    ALT 51 (*)    Alkaline Phosphatase 190 (*)    GFR calc non Af Amer 60 (*)    All other components within normal limits  CBC WITH DIFFERENTIAL/PLATELET - Abnormal; Notable for the following components:   RBC 2.98 (*)    Hemoglobin 8.6 (*)    HCT 27.5 (*)    RDW 18.5 (*)    Platelets 76 (*)    All other components within normal limits  LACTIC ACID, PLASMA - Abnormal; Notable for the following components:   Lactic Acid, Venous 2.0 (*)    All other components within normal limits  BRAIN NATRIURETIC PEPTIDE - Abnormal; Notable for the following components:   B Natriuretic Peptide 1,059.0 (*)    All other components within normal limits  URINALYSIS, COMPLETE (UACMP) WITH MICROSCOPIC - Abnormal; Notable for the following components:   Color, Urine AMBER (*)    APPearance CLOUDY (*)    Protein, ur 30 (*)    All other components within normal limits  TROPONIN I (HIGH SENSITIVITY) - Abnormal; Notable for the following components:   Troponin I (High Sensitivity) 275 (*)    All other components within normal limits   TROPONIN I (HIGH SENSITIVITY) - Abnormal; Notable  for the following components:   Troponin I (High Sensitivity) 268 (*)    All other components within normal limits  SARS CORONAVIRUS 2 BY RT PCR (HOSPITAL ORDER, PERFORMED IN [facility])  CULTURE, BLOOD (ROUTINE X 2)  CULTURE, BLOOD (ROUTINE X 2)  LACTIC ACID, PLASMA   ____________________________________________  EKG  ED ECG REPORT I, Dionne Bucy, the attending physician, personally viewed and interpreted this ECG.  Date: 07/27/2019 EKG Time: 1055 Rate: 90 Rhythm: normal sinus rhythm QRS Axis: Borderline right axis Intervals: Prolonged PR ST/T Wave abnormalities: normal Narrative Interpretation: no evidence of acute ischemia  ____________________________________________  RADIOLOGY  CXR: Patchy left basilar opacity  ____________________________________________   PROCEDURES  Procedure(s) performed: No  Procedures  Critical Care performed: Yes  CRITICAL CARE Performed by: Dionne Bucy   Total critical care time: 30 minutes  Critical care time was exclusive of separately billable procedures and treating other patients.  Critical care was necessary to treat or prevent imminent or life-threatening deterioration.  Critical care was time spent personally by me on the following activities: development of treatment plan with patient and/or surrogate as well as nursing, discussions with consultants, evaluation of patient's response to treatment, examination of patient, obtaining history from patient or surrogate, ordering and performing treatments and interventions, ordering and review of laboratory studies, ordering and review of radiographic studies, pulse oximetry and re-evaluation of patient's condition. ____________________________________________   INITIAL IMPRESSION / ASSESSMENT AND PLAN / ED COURSE  Pertinent labs & imaging results that were available during my care of the patient  were reviewed by me and considered in my medical decision making (see chart for details).  85 year old male with PMH as noted above presents from his facility with shortness of breath of slightly unclear time course, noted to be hypoxic to the 70s on room air by EMS.  O2 saturation is now in the mid 90s on nasal cannula.  The patient states he has been feeling weak but is unable to give much history.  I reviewed the past medical records in Epic.  The patient was most recently admitted in May of last year with a COPD exacerbation with respiratory failure as well as sepsis.  On exam today, he has a low-grade fever and borderline blood pressure.  O2 saturation is in the mid to high 90s on nasal cannula.  The remainder of the exam is as described above.  Neurologic exam is nonfocal.  Overall presentation is most consistent with sepsis.  We will obtain chest x-ray, lab work-up, urinalysis, give fluids, and reassess.  ----------------------------------------- 2:11 PM on 07/27/2019 -----------------------------------------  The UA shows some WBCs and RBCs, although the chest x-ray also shows findings compatible with pneumonia which is the patient's most likely source.  I have ordered broad-spectrum antibiotics for HCAP.  Lactic acid has improved with fluids, and I would like to be gentle with IV hydration given the patient's elevated BNP and the possibility of CHF.  COVID-19 swab is negative.  I signed the patient out to the hospitalist service for admission.  ____________________________  William Darling was evaluated in Emergency Department on 07/27/2019 for the symptoms described in the history of present illness. He was evaluated in the context of the global COVID-19 pandemic, which necessitated consideration that the patient might be at risk for infection with the SARS-CoV-2 virus that causes COVID-19. Institutional protocols and algorithms that pertain to the evaluation of patients at risk for  COVID-19 are in a state of rapid change based on  information released by regulatory bodies including the CDC and federal and state organizations. These policies and algorithms were followed during the patient's care in the ED. ____________________________________________   FINAL CLINICAL IMPRESSION(S) / ED DIAGNOSES  Final diagnoses:  Sepsis, due to unspecified organism, unspecified whether acute organ dysfunction present [facility])      NEW MEDICATIONS STARTED DURING THIS VISIT:  New Prescriptions   No medications on file     Note:  This document was prepared using Dragon voice recognition software and may include unintentional dictation errors.    Arta Silence, MD 07/27/19 [id number]

## 2019-07-27 NOTE — ED Notes (Signed)
This RN spoke with pt's daughter, Neoma Laming. Daughter updated on pt care and informed she cannot come up here until COVID test comes back. Daughter understanding. Will continue to update.

## 2019-07-27 NOTE — ED Notes (Signed)
Pts daughter

## 2019-07-27 NOTE — ED Notes (Signed)
Pt repositioned in bed at this time by this RN and Claiborne Billings, Therapist, sports. Medications administered per MD order. Pt states he is comfortable where he at in the bed. Pt turned to L side. Explained to sister to prevent further skin breakdown. Pt and sister deny further needs at this time.

## 2019-07-27 NOTE — ED Notes (Signed)
Pt's sister at bedside.

## 2019-07-27 NOTE — ED Notes (Signed)
Date and time results received: 07/27/19 11:53 AM  (use smartphrase ".now" to insert current time)  Test: Lactic Acid  Critical Value: 2.0  Name of Provider Notified: Dr. Cherylann Banas  Orders Received? Or Actions Taken?: See orders

## 2019-07-27 NOTE — H&P (Addendum)
[place] [facility] [place] NAME: [name]    MR#:  [id number]  DATE OF BIRTH:  [birth year]  DATE OF ADMISSION:  07/27/2019  PRIMARY CARE PHYSICIAN: Butler Denmark, MD   REQUESTING/REFERRING PHYSICIAN: Arta Silence, MD  CHIEF COMPLAINT:   Chief Complaint  Patient presents with  . Shortness of Breath   Shortness of breath today. HISTORY OF PRESENT ILLNESS:  [name]  is a 85 y.o. male with a known history of multiple medical problems as below.  The patient is sent from [facility] skilled nursing facility due to above chief complaints.  The patient is found shortness of breath for several days.  He is found hypoxia at 70s by EMS and put on oxygen by nasal cannula.  He is very poor historian due to dementia.  Chest x-ray showed left-sided pneumonia. COVID19 test is negative.  ED physician request admission. PAST MEDICAL HISTORY:   Past Medical History:  Diagnosis Date  . COPD (chronic obstructive pulmonary disease) ([place])   . CVA (cerebral infarction)   . Diabetes mellitus   . GI bleed   . Hypertension   . MDD (major depressive disorder)    w/ paranoia  . PTSD (post-traumatic stress disorder)   . Vascular dementia ([place])     PAST SURGICAL HISTORY:   Past Surgical History:  Procedure Laterality Date  . TOTAL HIP ARTHROPLASTY Right     SOCIAL HISTORY:   Social History   Tobacco Use  . Smoking status: Never Smoker  . Smokeless tobacco: Never Used  Substance Use Topics  . Alcohol use: No    FAMILY HISTORY:  History reviewed. No pertinent family history.  DRUG ALLERGIES:   Allergies  Allergen Reactions  . Yellow Jacket Venom Anaphylaxis  . Aggrenox [Aspirin-Dipyridamole Er] Other (See Comments)    Reaction: unknown  . Bactrim [Sulfamethoxazole-Trimethoprim] Other (See Comments)    Reaction: unknown  . Betadine Prepstick Plus Other (See Comments)    Reaction: unknown  . Contrast Media [Iodinated  Diagnostic Agents] Other (See Comments)    Reaction: unknown. Patient states that he does not remember the reaction, but it happened at the [place]  . Cortisone Other (See Comments)    Reaction: unknown  . Penicillins Other (See Comments)    Reaction: unknown Has patient had a PCN reaction causing immediate rash, facial/tongue/throat swelling, SOB or lightheadedness with hypotension: Unknown Has patient had a PCN reaction causing severe rash involving mucus membranes or skin necrosis: Unknown Has patient had a PCN reaction that required hospitalization: Unknown Has patient had a PCN reaction occurring within the last 10 years: Unknown If all of the above answers are "NO", then may proceed with Cephalosporin use.  . Salsalate Other (See Comments)    Reaction: unknown  . Sulfa Antibiotics Other (See Comments)    Reaction: unknown  . Tegretol [Carbamazepine] Other (See Comments)    Reaction: unknown    REVIEW OF SYSTEMS:   Review of Systems  Unable to perform ROS: Dementia    MEDICATIONS AT HOME:   Prior to Admission medications   Medication Sig Start Date End Date Taking? Authorizing Provider  acetaminophen (TYLENOL) 325 MG tablet Take 650 mg by mouth every 8 (eight) hours.     [provider]  albuterol (PROVENTIL HFA;VENTOLIN HFA) 108 (90 Base) MCG/ACT inhaler Inhale 1 puff into the lungs every 6 (six) hours as needed for shortness of breath.     [provider]  ARIPiprazole (  ABILIFY) 5 MG tablet Take 5 mg by mouth daily.    [provider]  aspirin EC 81 MG tablet Take 81 mg by mouth daily.    [provider]  atorvastatin (LIPITOR) 40 MG tablet Take 40 mg by mouth at bedtime.     [provider]  carbamide peroxide (DEBROX) 6.5 % otic solution Place 5 drops into both ears 2 (two) times daily as needed (cerum impaction).     [provider]  cefdinir (OMNICEF) 300 MG capsule Take 1 capsule (300 mg total) by mouth 2 (two) times  daily. 02/20/18   Ramonita Lab, MD  docusate sodium 100 MG CAPS Take 100 mg by mouth daily. Patient not taking: Reported on 09/02/2017 11/07/13   Kari Baars, MD  Emollient [facility]) LOTN Apply 1 application topically daily. Apply to face    [provider]  gabapentin (NEURONTIN) 300 MG capsule Take 300 mg by mouth 2 (two) times daily.    [provider]  HYDROcodone-acetaminophen (NORCO/VICODIN) 5-325 MG tablet Take 1 tablet by mouth every 6 (six) hours as needed for moderate pain. 02/20/18   Gouru, Deanna Artis, MD  insulin aspart (NOVOLOG) 100 UNIT/ML injection Inject 0-5 Units into the skin at bedtime. Patient not taking: Reported on 09/02/2017 11/07/13   Kari Baars, MD  insulin aspart (NOVOLOG) 100 UNIT/ML injection Inject 0-9 Units into the skin 3 (three) times daily with meals. CBG < 70: implement hypoglycemia protocol CBG 70 - 120: 0 units CBG 121 - 150: 1 unit CBG 151 - 200: 2 units CBG 201 - 250: 3 units CBG 251 - 300: 5 units CBG 301 - 350: 7 units CBG 351 - 400: 9 units CBG > 400: call MD and obtain STAT lab verification, Do NOT hold if patient is NPO. Sensitive Scale. 02/20/18   Gouru, Deanna Artis, MD  ketoconazole (NIZORAL) 2 % shampoo Apply 1 application topically as directed. Apply to wet scalp and face twice weekly as needed for dandruff/ flakey skin    [provider]  magnesium oxide (MAG-OX) 400 (241.3 MG) MG tablet Take 2 tablets (800 mg total) by mouth daily. Patient taking differently: Take 800 mg by mouth 3 (three) times daily.  11/07/13   Kari Baars, MD  Menthol-Methyl Salicylate [facility] GREASELESS EX) Apply 1 application topically as needed (pain).    [provider]  metFORMIN (GLUCOPHAGE) 1000 MG tablet Take 1,000 mg by mouth 2 (two) times daily with a meal.     [provider]  Multiple Vitamin (MULTIVITAMIN WITH MINERALS) TABS tablet Take 1 tablet by mouth daily.    [provider]  nitroGLYCERIN (NITROSTAT) 0.4 MG  SL tablet Place 1 tablet (0.4 mg total) under the tongue every 5 (five) minutes as needed for chest pain. 11/07/13   Kari Baars, MD  nystatin (MYCOSTATIN/NYSTOP) powder Apply topically 3 (three) times daily. To affected penile area 02/20/18   Gouru, Deanna Artis, MD  pantoprazole (PROTONIX) 40 MG tablet Take 1 tablet (40 mg total) by mouth daily. 11/07/13   Kari Baars, MD  polyethylene glycol [facility] Ethelene Hal) packet Take 17 g by mouth 2 (two) times daily as needed for mild constipation.     [provider]  predniSONE (STERAPRED UNI-PAK 21 TAB) 10 MG (21) TBPK tablet Take 1 tablet (10 mg total) by mouth daily. Take 6 tablets by mouth for 1 day followed by  5 tablets by mouth for 1 day followed by  4 tablets by mouth for 1 day followed by  3 tablets by mouth for 1 day followed by  2 tablets by mouth for 1 day followed by  1 tablet by mouth for a day and stop 02/20/18   Ramonita LabGouru, Aruna, MD  protein supplement shake (PREMIER PROTEIN) LIQD Take 325 mLs (11 oz total) by mouth 3 (three) times daily between meals. 02/20/18   Ramonita LabGouru, Aruna, MD  saccharomyces boulardii (FLORASTOR) 250 MG capsule Take 1 capsule (250 mg total) by mouth 2 (two) times daily. 02/20/18   Gouru, Deanna ArtisAruna, MD  sennosides-docusate sodium (SENOKOT-S) 8.6-50 MG tablet Take 1 tablet by mouth 2 (two) times daily.     [provider]  tamsulosin (FLOMAX) 0.4 MG CAPS capsule Take 0.4 mg by mouth at bedtime.     [provider]  tiotropium (SPIRIVA) 18 MCG inhalation capsule Place 1 capsule (18 mcg total) into inhaler and inhale daily. Patient not taking: Reported on 09/02/2017 07/14/15   Ramonita LabGouru, Aruna, MD  umeclidinium bromide (INCRUSE ELLIPTA) 62.5 MCG/INH AEPB Inhale 1 puff into the lungs daily.    [provider]  venlafaxine XR (EFFEXOR-XR) 150 MG 24 hr capsule Take 1 capsule (150 mg total) by mouth daily with breakfast. 11/07/13   Kari BaarsHawkins, Edward, MD  white petrolatum (VASELINE) GEL Apply 1 application  topically 3 (three) times daily as needed (nose bleeds). Apply to bilateral nares    [provider]      VITAL SIGNS:  Blood pressure (!) 103/47, pulse 99, temperature 100.1 F (37.8 C), temperature source Rectal, resp. rate (!) 23, height 6\' 3"  (1.905 m), weight 81.6 kg, SpO2 96 %.  PHYSICAL EXAMINATION:  Physical Exam  GENERAL:  85 y.o.-year-old patient lying in the bed with no acute distress.  EYES: Pupils equal, round, reactive to light and accommodation. No scleral icterus. Extraocular muscles intact.  HEENT: Head atraumatic, normocephalic. NECK:  Supple, no jugular venous distention. No thyroid enlargement, no tenderness.  LUNGS: Normal breath sounds bilaterally, no wheezing, rales,rhonchi or crepitation. No use of accessory muscles of respiration.  CARDIOVASCULAR: S1, S2 normal. No murmurs, rubs, or gallops.  ABDOMEN: Soft, nontender, nondistended. Bowel sounds present. No organomegaly or mass.  EXTREMITIES: No pedal edema, cyanosis, or clubbing.  NEUROLOGIC: Cranial nerves II through XII are intact. Muscle strength 0/5 in bilateral lower extremities. Sensation intact. Gait not checked.  PSYCHIATRIC: The patient is alert and oriented x 3.  SKIN: No obvious rash, lesion, or ulcer.   LABORATORY PANEL:   CBC Recent Labs  Lab 07/27/19 1106  WBC 9.8  HGB 8.6*  HCT 27.5*  PLT 76*   ------------------------------------------------------------------------------------------------------------------  Chemistries  Recent Labs  Lab 07/27/19 1106  NA 140  K 5.0  CL 108  CO2 24  GLUCOSE 280*  BUN 72*  CREATININE 1.12  CALCIUM 9.1  AST 43*  ALT 51*  ALKPHOS 190*  BILITOT 1.1   ------------------------------------------------------------------------------------------------------------------  Cardiac Enzymes No results for input(s): TROPONINI in the last 168 hours.  ------------------------------------------------------------------------------------------------------------------  RADIOLOGY:  Dg Chest Portable 1 View  Result Date: 07/27/2019 CLINICAL DATA:  Chest pain and shortness of breath. History of atrial fibrillation. EXAM: PORTABLE CHEST 1 VIEW COMPARISON:  PA and lateral chest 02/17/2018. Single-view of the chest 09/02/2017. CT chest 07/11/2015. FINDINGS: Patchy left basilar airspace opacity is present. Right lung is clear. Heart size is normal. Aortic atherosclerosis is seen. No pneumothorax or pleural effusion. IMPRESSION: Patchy left basilar airspace opacity could be due to atelectasis or pneumonia. Atherosclerosis. Electronically Signed   By: Drusilla Kannerhomas  Dalessio M.D.   On:  07/27/2019 12:05      IMPRESSION AND PLAN:   Acute respiratory failure with hypoxia due to left-sided pneumonia, HAP. The patient will be admitted to medical floor. Continue oxygen by nasal cannula, albuterol as needed. Continue antibiotics, albuterol as needed, Robitussin as needed.  Dehydration.  IV fluid support and follow-up BMP. COPD.  Continue home nebulizer. Anemia of chronic disease.  Stable. Hyperglycemia due to diabetes.  Start sliding scale. History of CVA.  Continue Lipitor. Hypertension.  Hold hypertension medication due to softer blood pressure. Dementia.  Aspiration precaution. Chronic thrombocytopenia.  Follow-up platelet. Follow the patient's daughter, the patient is bedbound. All the records are reviewed and case discussed with ED provider. Management plans discussed with the patient, his daughter and they are in agreement.  CODE STATUS: DNR.  TOTAL TIME TAKING CARE OF THIS PATIENT: 56 minutes.    Shaune Pollack M.D on 07/27/2019 at 1:55 PM  Between 7am to 6pm - Pager - [phone]  After 6pm go to [email or web address] - Social research officer, government  [facility]  Office  [phone]  CC: Primary care physician; Evangeline Gula, MD    Note: This dictation was prepared with Dragon dictation along with smaller phrase technology. Any transcriptional errors that result from this process are unin

## 2019-07-27 NOTE — ED Notes (Signed)
Jinny Blossom, RN called dietary to request soft food for pt. Pt currently has no teeth in.

## 2019-07-27 NOTE — ED Notes (Signed)
This RN called pt's sister, Neoma Laming, to inform her pt's COVID was negative. Sister states she is on the way to the hospital. Pt made aware.

## 2019-07-27 NOTE — Progress Notes (Signed)
PHARMACY -  BRIEF ANTIBIOTIC NOTE   Pharmacy has received consult(s) for vancomycin and cefepime from an ED provider.  The patient's profile has been reviewed for ht/wt/allergies/indication/available labs.    One time order(s) placed for vanc 1 g + 1 g (2 g total) + cefepime 2 g  Further antibiotics/pharmacy consults should be ordered by admitting physician if indicated.                       Thank you,  Tawnya Crook, PharmD 07/27/2019  12:46 PM

## 2019-07-27 NOTE — ED Notes (Signed)
Repeat lavender and green top sent to lab.

## 2019-07-27 NOTE — ED Notes (Signed)
Attempted to call report

## 2019-07-27 NOTE — ED Triage Notes (Signed)
Pt from [facility]. Per [facility] stated pt c/o CP and SOB. Per EMS [facility] stated pt 70% on RA. [facility] placed pt on NRB. Pt placed on 3L oxygen via [place] from EMS. Upon arrival pt 94% on RA. Pt states he has been feeling bad for several days now. Pt in afib with HR of 86. Pt CO2 9.

## 2019-07-27 NOTE — ED Notes (Signed)
Date and time results received: 07/27/19 2:28 PM  (use smartphrase ".now" to insert current time)  Test: Troponin Critical Value: 268  Name of Provider Notified: Dr. Arnaldo Natal  Orders Received? Or Actions Taken?: No new orders at this time

## 2019-07-28 DIAGNOSIS — L8990 Pressure ulcer of unspecified site, unspecified stage: Secondary | ICD-10-CM | POA: Insufficient documentation

## 2019-07-28 LAB — GLUCOSE, CAPILLARY
Glucose-Capillary: 288 mg/dL — ABNORMAL HIGH (ref 70–99)
Glucose-Capillary: 297 mg/dL — ABNORMAL HIGH (ref 70–99)
Glucose-Capillary: 340 mg/dL — ABNORMAL HIGH (ref 70–99)
Glucose-Capillary: 297 mg/dL — ABNORMAL HIGH (ref 70–99)

## 2019-07-28 LAB — PROCALCITONIN: Procalcitonin: 0.38 ng/mL

## 2019-07-28 LAB — BASIC METABOLIC PANEL
Anion gap: 17 — ABNORMAL HIGH (ref 5–15)
BUN: 60 mg/dL — ABNORMAL HIGH (ref 8–23)
CO2: 15 mmol/L — ABNORMAL LOW (ref 22–32)
Calcium: 8.3 mg/dL — ABNORMAL LOW (ref 8.9–10.3)
Chloride: 108 mmol/L (ref 98–111)
Creatinine, Ser: 0.91 mg/dL (ref 0.61–1.24)
GFR calc Af Amer: >60 mL/min (ref >60)
GFR calc non Af Amer: >60 mL/min (ref >60)
Glucose, Bld: 265 mg/dL — ABNORMAL HIGH (ref 70–99)
Potassium: 4.8 mmol/L (ref 3.5–5.1)
Sodium: 140 mmol/L (ref 135–145)

## 2019-07-28 LAB — CBC
HCT: 27 % — ABNORMAL LOW (ref 39.0–52.0)
Hemoglobin: 8.5 g/dL — ABNORMAL LOW (ref 13.0–17.0)
MCH: 28.6 pg (ref 26.0–34.0)
MCHC: 31.5 g/dL (ref 30.0–36.0)
MCV: 90.9 fL (ref 80.0–100.0)
Platelets: 78 10*3/uL — ABNORMAL LOW (ref 150–400)
RBC: 2.97 MIL/uL — ABNORMAL LOW (ref 4.22–5.81)
RDW: 18.2 % — ABNORMAL HIGH (ref 11.5–15.5)
WBC: 9.5 10*3/uL (ref 4.0–10.5)
nRBC: 0.2 % (ref 0.0–0.2)

## 2019-07-28 MED ORDER — SODIUM CHLORIDE 0.9 % IV SOLN
1.0000 g | INTRAVENOUS | Status: DC
  Administered 2019-07-28: 1 g via INTRAVENOUS
  Filled 2019-07-28: qty 10
  Filled 2019-07-28: qty 1

## 2019-07-28 MED ORDER — AZITHROMYCIN 500 MG PO TABS: 500.0000 mg | ORAL_TABLET | Freq: Every day | ORAL | Status: DC

## 2019-07-28 MED ORDER — ADULT MULTIVITAMIN W/MINERALS CH
1.0000 | ORAL_TABLET | Freq: Every day | ORAL | Status: DC
  Administered 2019-07-29: 09:00:00 1 via ORAL
  Filled 2019-07-28: qty 1

## 2019-07-28 MED ORDER — NEPRO/CARBSTEADY PO LIQD
237.0000 mL | Freq: Two times a day (BID) | ORAL | Status: DC
  Administered 2019-07-28 – 2019-07-30 (×3): 237 mL via ORAL

## 2019-07-28 MED ORDER — SODIUM CHLORIDE 0.9 % IV SOLN
500.0000 mg | INTRAVENOUS | Status: DC
  Administered 2019-07-28: 500 mg via INTRAVENOUS
  Filled 2019-07-28 (×2): qty 500

## 2019-07-28 NOTE — Evaluation (Signed)
Clinical/Bedside Swallow Evaluation Patient Details  Name: [name] MRN: [id number] Date of Birth: Apr 07, 1933  Today's Date: 07/28/2019 Time: SLP Start Time (ACUTE ONLY): 0945 SLP Stop Time (ACUTE ONLY): 1045 SLP Time Calculation (min) (ACUTE ONLY): 60 min  Past Medical History:  Past Medical History:  Diagnosis Date  . COPD (chronic obstructive pulmonary disease) ([place])   . CVA (cerebral infarction)   . Diabetes mellitus   . GI bleed   . Hypertension   . MDD (major depressive disorder)    w/ paranoia  . PTSD (post-traumatic stress disorder)   . Vascular dementia [facility])    Past Surgical History:  Past Surgical History:  Procedure Laterality Date  . TOTAL HIP ARTHROPLASTY Right    HPI:  Pt is a 85 y.o. male with a known history of multiple medical problems including Vascular Dementia, COPD, CVA, HTN, DM, MDD, PTSD.  He is bedbound at baseline using a lift to transfer from bed at [facility].  The patient is sent from [facility] skilled nursing facility due to shortness of breath for several days.  He is found hypoxia at 70s by EMS and put on oxygen by nasal cannula.  He is very poor historian due to Dementia.  Chest x-ray showed left-sided pneumonia, right side clear; COVID19 test is negative.  Dtr stated she had seen increased, involuntary oral and mandible movements during oral intake for ~2 months; it had been present previously but had "calmed down some".    Assessment / Plan / Recommendation Clinical Impression  Pt appears to present w/ oropharyngeal phase dysphagia at this bedside assessment today most impacted by being fairly Edentulous - Dtr is bringing Upper denture plate. Pt's overall Cognitive presentation and oropharyngeal phase dysphagia increases his risk for dysphagia and aspiration. Any aspiration w/ po intake can increase risk for Pulmonary decline from such. Pt exhibited increased involuntary oral/labial movements BASELINE. Pt was resting in bed and required full  positioning for upright, midline positioning. Pt is bedbound at baseline per Dtr. Pt was verbal to basic questions w/ much reduced intelligiblity of speech(muttered, mumbled) - mostly Edentulous; suspect some degree of Cognitive decline impact as well. However, pt was able to follow through w/ po tasks given cues and answered basic questions re: self. Pt was presented trials of ice chips, thin liquids, and puree. He consumed chips and thin liquids via Cup/Straw sips (and food trials) w/ no immediate, overt clinical s/s of aspiration; no decline in vocal quality when assessed and no decline in respiratory presentation as trials continued. Pt did exhibit delayed throat clearing and coughing x3-4 when pt took Large draws on the straw w/ trials of thin liquids; this was Not noted during trials of Nectar liquids. Education given on need to take smaller sips. Pt was then given trials of Purees. He exhibited adequate Oral phase time w/ all consistencies; timely management and A-P transfer for swallowing/clearing noted; full oral clearing of trials. Pt appeared to present w/ increased involuntary oral/labial movements during initial oral prep phase -- these calmed once pt had the cup rim or straw or spoon in mouth. No anterior spillage noted. Pt required min vebal cues for following instruction - suspect d/t baseilne Cognitive status. Noted adequate oral motor tone/strength w/ bolus management; no unilateral weakness noted. Pt helped to feed himself given support and setup each time. Recommend a Dysphagia level 3 (chopped meats) dysphagia diet w/ thin liquids at this time w/ Denture plate in; aspiration precautions; Supervision during meals in order  to ensure assistance w/ eating/drinking; Pills Whole vs Crushed in puree w/ NSG. ST services will f/u w/ education, need for level 2 diet, and objective swallowing assessment as indicated while admitted. Will discuss further w/ Dtr, MD.   Pt would benefit from a Palliative Care  consult for GOC as pt's Dtr stated she wanted to maintain a quality of life for pt; she divulged that pt had been on "thickened liquids before" at the time of his CVA(?).  SLP Visit Diagnosis: Dysphagia, oropharyngeal phase (R13.12)(baseline Dementia)    Aspiration Risk  Mild aspiration risk;Risk for inadequate nutrition/hydration(reduced following precautions)    Diet Recommendation     Medication Administration: Whole meds with puree(vs Crushed for easier, safer swallowing)    Other  Recommendations Recommended Consults: (Dietician f/u; Palliative care consult for GOC) Oral Care Recommendations: Oral care BID;Staff/trained caregiver to provide oral care Other Recommendations: (n/a at this time)   Follow up Recommendations [facility]      Frequency and Duration min 3x week  2 weeks       Prognosis Prognosis for Safe Diet Advancement: Fair Barriers to Reach Goals: Cognitive deficits;Time post onset;Severity of deficits      Swallow Study   General Date of Onset: 07/27/19 HPI: Pt is a 85 y.o. male with a known history of multiple medical problems including Vascular Dementia, COPD, CVA, HTN, DM, MDD, PTSD.  He is bedbound at baseline [facility]  [facility] due to shortness of breath for several days.  He is found hypoxia at 70s by EMS and put on oxygen by nasal cannula.  He is very poor historian due to Dementia.  Chest x-ray showed left-sided pneumonia, right side clear; COVID19 test is negative.  Dtr stated she had seen increased, involuntary oral and mandible movements during oral intake for ~2 months; it had been present previously but had "calmed down some".  Type of Study: Bedside Swallow Evaluation Previous Swallow Assessment: suspected -- per Dtr, pt had a stroke several years ago and was on thickened liquids "for a time then" Diet Prior to this Study: Regular;Thin liquids Temperature  Spikes Noted: No(wbc 9.5) Respiratory Status: Nasal cannula(2 L) History of Recent Intubation: No Behavior/Cognition: Alert;Cooperative;Pleasant mood;Confused;Distractible;Requires cueing(Mild) Oral Cavity Assessment: Dry(sticky) Oral Care Completed by SLP: Yes Oral Cavity - Dentition: Edentulous(except for few front lower teeth - Dtr bringing upper plate) Vision: Functional for self-feeding Self-Feeding Abilities: Able to feed self;Needs assist;Needs set up;Total assist(overall weakness and shakiness) Patient Positioning: Upright in bed(needed full positioning assistance) Baseline Vocal Quality: Low vocal intensity(reduced intelligibility -- edentulous) Volitional Cough: (Fair) Volitional Swallow: Able to elicit    Oral/Motor/Sensory Function Overall Oral Motor/Sensory Function: Within functional limits(grossly; no dentition for labial form)   Ice Chips Ice chips: Within functional limits Presentation: Spoon(fed; 4 trials)   Thin Liquid Thin Liquid: Impaired Presentation: Cup;Self Fed;Straw(20+ trials) Oral Phase Impairments: Poor awareness of bolus(initial increased oral movements during prep phase) Pharyngeal  Phase Impairments: Throat Clearing - Delayed;Cough - Delayed(x3-4 w/ larger sips ) Other Comments: pt took large draws on straw    Nectar Thick Nectar Thick Liquid: Within functional limits Presentation: Self Fed;Straw(~4 ozs ) Other Comments: pt took large draws on straw   Honey Thick Honey Thick Liquid: Not tested   Puree Puree: Within functional limits Presentation: Spoon(fed; 8 trials)   Solid     Solid: Not tested Other Comments: did not have upper  Denture plate; Dtr to bring       Jerilynn Som, MS, CCC-SLP [name] 07/28/2019,4:36 PM

## 2019-07-28 NOTE — Progress Notes (Signed)
   07/28/19 2000  Clinical Encounter Type  Visited With Patient and family together  Visit Type Initial  Referral From Nurse  Spiritual Encounters  Spiritual Needs Prayer;Emotional  Ch received an OR for emotional support. Daughter was at bedside. Pt requested a prayer and ch prayed with the family. Ch will follow up.

## 2019-07-28 NOTE — NC FL2 (Signed)
[place] LEVEL OF CARE SCREENING TOOL     IDENTIFICATION  Patient Name: [name] Birthdate: [birth year] Sex: male Admission Date (Current Location): 07/27/2019  [place] and [place] Number:  Engineering geologist and Address:  [facility], [address], [place], [place] [address]      Provider Number: [id number]  Attending Physician Name and Address:  Sela Hua, MD  Relative Name and Phone Number:       Current Level of Care: Hospital Recommended Level of Care: [place] Prior Approval Number:    Date Approved/Denied:   PASRR Number: [id number]  Discharge Plan: SNF    Current Diagnoses: Patient Active Problem List   Diagnosis Date Noted  . Pressure injury of skin 07/28/2019  . Pneumonia 07/27/2019  . Sepsis ([place]) 02/17/2018  . Hypoxia 07/12/2015  . Pressure ulcer 07/12/2015  . Fall at nursing home 11/06/2013  . Hip fracture, right ([place]) 11/06/2013  . Vascular dementia ([place])   . Hypertension   . Muscle weakness (generalized) 06/11/2011  . Difficulty in walking(719.7) 06/11/2011    Orientation RESPIRATION BLADDER Height & Weight     Self, Situation, Place  Normal   Weight: 81.6 kg Height:  6\' 3"  (190.5 cm)  BEHAVIORAL SYMPTOMS/MOOD NEUROLOGICAL BOWEL NUTRITION STATUS      Continent Diet(Dysphagia diet 3)  AMBULATORY STATUS COMMUNICATION OF NEEDS Skin   Total Care Verbally Other (Comment)(sacral maceration from incontinence)                       Personal Care Assistance Level of Assistance  Bathing, Feeding, Dressing Bathing Assistance: Maximum assistance Feeding assistance: Maximum assistance Dressing Assistance: Maximum assistance     Functional Limitations Info  Hearing   Hearing Info: Impaired      SPECIAL CARE FACTORS FREQUENCY                       Contractures Contractures Info: Not present    Additional Factors Info  Code Status, Allergies Code Status Info:  DNR Allergies Info: Yellow Jacket venom, aggrenox, bactrim, betadine, contrast media, cortisone, PCN, salsalate, sulfa, tegretol,           Current Medications (07/28/2019):  This is the current hospital active medication list Current Facility-Administered Medications  Medication Dose Route Frequency Provider Last Rate Last Dose  . albuterol (PROVENTIL) (2.5 MG/3ML) 0.083% nebulizer solution 2.5 mg  2.5 mg Nebulization Q2H PRN Demetrios Loll, MD      . aspirin EC tablet 81 mg  81 mg Oral Daily Demetrios Loll, MD   81 mg at 07/28/19 0942  . atorvastatin (LIPITOR) tablet 40 mg  40 mg Oral QHS Demetrios Loll, MD   40 mg at 07/27/19 2210  . azithromycin (ZITHROMAX) 500 mg in sodium chloride 0.9 % 250 mL IVPB  500 mg Intravenous Q24H Hallaji, Sheema M, RPH 250 mL/hr at 07/28/19 1251 500 mg at 07/28/19 1251  . cefTRIAXone (ROCEPHIN) 1 g in sodium chloride 0.9 % 100 mL IVPB  1 g Intravenous Q24H Mayo, Pete Pelt, MD 200 mL/hr at 07/28/19 1130 1 g at 07/28/19 1130  . enoxaparin (LOVENOX) injection 40 mg  40 mg Subcutaneous Q24H Demetrios Loll, MD   40 mg at 07/27/19 1534  . ferrous sulfate tablet 325 mg  325 mg Oral Q M,W,F Demetrios Loll, MD   325 mg at 07/28/19 0944  . gabapentin (NEURONTIN) capsule 300 mg  300 mg Oral BID  Shaune Pollack, MD   300 mg at 07/28/19 0944  . guaiFENesin-dextromethorphan (ROBITUSSIN DM) 100-10 MG/5ML syrup 5 mL  5 mL Oral Q4H PRN Shaune Pollack, MD      . insulin aspart (novoLOG) injection 0-5 Units  0-5 Units Subcutaneous QHS Shaune Pollack, MD   3 Units at 07/27/19 2211  . insulin aspart (novoLOG) injection 0-9 Units  0-9 Units Subcutaneous TID WC Shaune Pollack, MD   5 Units at 07/28/19 1253  . magnesium oxide (MAG-OX) tablet 400 mg  400 mg Oral Daily Shaune Pollack, MD   400 mg at 07/28/19 0944  . Melatonin TABS 5 mg  5 mg Oral QHS Shaune Pollack, MD      . mupirocin ointment (BACTROBAN) 2 % 1 application  1 application Nasal BID Shaune Pollack, MD   1 application at 07/28/19 0945  . pantoprazole (PROTONIX) EC  tablet 40 mg  40 mg Oral BID Karleen Hampshire, MD   40 mg at 07/28/19 0942  . polyethylene glycol (MIRALAX / GLYCOLAX) packet 17 g  17 g Oral QPM Shaune Pollack, MD   17 g at 07/27/19 2211  . senna-docusate (Senokot-S) tablet 1 tablet  1 tablet Oral QHS PRN Shaune Pollack, MD      . senna-docusate (Senokot-S) tablet 2 tablet  2 tablet Oral BID Shaune Pollack, MD   2 tablet at 07/28/19 [phone]  . tamsulosin (FLOMAX) capsule 0.4 mg  0.4 mg Oral Daily Shaune Pollack, MD   0.4 mg at 07/28/19 0943  . venlafaxine XR (EFFEXOR-XR) 24 hr capsule 150 mg  150 mg Oral Q breakfast Shaune Pollack, MD   150 mg at 07/28/19 9767     Discharge Medications: Please see discharge summary for a list of discharge medications.  Relevant Imaging Results:  Relevant Lab Results:   Additional Information SS# [id number]  Allayne Butcher, RN

## 2019-07-28 NOTE — TOC Initial Note (Signed)
Transition of Care [facility]) - Initial/Assessment Note    Patient Details  Name: [name] MRN: [id number] Date of Birth: [birth year]  Transition of Care [facility]) CM/SW Contact:    Allayne Butcher, RN Phone Number: 07/28/2019, 10:45 AM  Clinical Narrative:                 Patient admitted with pneumonia from [facility].  Patient is a long term resident of [facility], he has been there since 2015.  Per patient's daughter [name], patient is bed bound and has not walked since he broke his hip in 2015.  Patient also has vascular dementia.  Discharge plan will be for patient to return to [facility].    Expected Discharge Plan: Skilled Nursing Facility Barriers to Discharge: Continued Medical Work up   Patient Goals and CMS Choice        Expected Discharge Plan and Services Expected Discharge Plan: Skilled Nursing Facility   Discharge Planning Services: CM Consult   Living arrangements for the past 2 months: Skilled Nursing Facility                                      Prior Living Arrangements/Services Living arrangements for the past 2 months: Skilled Nursing Facility Lives with:: Facility Resident Patient language and need for interpreter reviewed:: No Do you feel safe going back to the place where you live?: Yes      Need for Family Participation in Patient Care: Yes (Comment)(dementia, facility resident) Care giver support system in place?: Yes (comment)(daughter)   Criminal Activity/Legal Involvement Pertinent to Current Situation/Hospitalization: No - Comment as needed  Activities of Daily Living Home Assistive Devices/Equipment: Other (Comment), Wheelchair(use lift to get patient up) ADL Screening (condition at time of admission) Patient's cognitive ability adequate to safely complete daily activities?: No Is the patient deaf or have difficulty hearing?: Yes Does the patient have difficulty seeing, even when wearing glasses/contacts?: Yes Does the  patient have difficulty concentrating, remembering, or making decisions?: Yes Patient able to express need for assistance with ADLs?: Yes Does the patient have difficulty dressing or bathing?: Yes Independently performs ADLs?: No Communication: Dependent Is this a change from baseline?: Pre-admission baseline Dressing (OT): Dependent Is this a change from baseline?: Pre-admission baseline Grooming: Dependent Is this a change from baseline?: Pre-admission baseline Feeding: Needs assistance Is this a change from baseline?: Pre-admission baseline Bathing: Dependent Is this a change from baseline?: Pre-admission baseline Toileting: Needs assistance Is this a change from baseline?: Pre-admission baseline In/Out Bed: Dependent Is this a change from baseline?: Pre-admission baseline Walks in Home: Dependent Is this a change from baseline?: Pre-admission baseline Does the patient have difficulty walking or climbing stairs?: Yes Weakness of Legs: Both Weakness of Arms/Hands: Both  Permission Sought/Granted Permission sought to share information with : Case Manager, Magazine features editor, Family Supports Permission granted to share information with : Yes, Verbal Permission Granted     Permission granted to share info w AGENCY: [employer]  Permission granted to share info w Relationship: Daughter     Emotional Assessment Appearance:: Appears stated age Attitude/Demeanor/Rapport: Engaged   Orientation: : Oriented to Self, Oriented to Place, Oriented to Situation Alcohol / Substance Use: Not Applicable Psych Involvement: No (comment)  Admission diagnosis:  Sepsis, due to unspecified organism, unspecified whether acute organ dysfunction present [facility]) [A41.9] Patient Active Problem List   Diagnosis Date  Noted  . Pressure injury of skin 07/28/2019  . Pneumonia 07/27/2019  . Sepsis ([place]) 02/17/2018  . Hypoxia 07/12/2015  . Pressure ulcer 07/12/2015  . Fall at nursing  home 11/06/2013  . Hip fracture, right ([place]) 11/06/2013  . Vascular dementia ([place])   . Hypertension   . Muscle weakness (generalized) 06/11/2011  . Difficulty in walking(719.7) 06/11/2011   PCP:  Butler Denmark, MD Pharmacy:  No Pharmacies Listed    Social Determinants of Health (SDOH) Interventions    Readmission Risk Interventions No flowsheet data found.

## 2019-07-28 NOTE — Progress Notes (Addendum)
[employer] [facility] [facility] [place] at [facility]   PATIENT NAME: [name]    MR#:  [id number]  DATE OF BIRTH:  [birth year]  SUBJECTIVE:   Patient states he is feeling fine this morning. He endorses dry cough. He denies fevers or chills.  Patient states he does not really know why he was brought to the emergency room yesterday.  REVIEW OF SYSTEMS:  Review of Systems  Constitutional: Negative for chills and fever.  HENT: Negative for congestion and sore throat.   Eyes: Negative for blurred vision and double vision.  Respiratory: Positive for cough. Negative for sputum production and shortness of breath.   Cardiovascular: Negative for chest pain and palpitations.  Gastrointestinal: Negative for nausea and vomiting.  Genitourinary: Negative for dysuria and urgency.  Musculoskeletal: Negative for back pain and neck pain.  Neurological: Negative for dizziness and headaches.  Psychiatric/Behavioral: Negative for depression. The patient is not nervous/anxious.     DRUG ALLERGIES:   Allergies  Allergen Reactions  . Yellow Jacket Venom Anaphylaxis  . Aggrenox [Aspirin-Dipyridamole Er] Other (See Comments)    Reaction: unknown  . Bactrim [Sulfamethoxazole-Trimethoprim] Other (See Comments)    Reaction: unknown  . Betadine Prepstick Plus Other (See Comments)    Reaction: unknown  . Contrast Media [Iodinated Diagnostic Agents] Other (See Comments)    Reaction: unknown. Patient states that he does not remember the reaction, but it happened at the [place]  . Cortisone Other (See Comments)    Reaction: unknown  . Penicillins Other (See Comments)    Reaction: unknown Has patient had a PCN reaction causing immediate rash, facial/tongue/throat swelling, SOB or lightheadedness with hypotension: Unknown Has patient had a PCN reaction causing severe rash involving mucus membranes or skin necrosis: Unknown Has patient had a PCN reaction that required hospitalization: Unknown Has patient  had a PCN reaction occurring within the last 10 years: Unknown If all of the above answers are "NO", then may proceed with Cephalosporin use.  . Salsalate Other (See Comments)    Reaction: unknown  . Sulfa Antibiotics Other (See Comments)    Reaction: unknown  . Tegretol [Carbamazepine] Other (See Comments)    Reaction: unknown   VITALS:  Blood pressure (!) 102/44, pulse 85, temperature 98.9 F (37.2 C), resp. rate 20, height 6\' 3"  (1.905 m), weight 81.6 kg, SpO2 99 %. PHYSICAL EXAMINATION:  Physical Exam  GENERAL:   Sitting up in bed, in no acute distress. + Coughing and choking while eating breakfast HEENT: Head atraumatic, normocephalic. Pupils equal, round, reactive to light and accommodation. No scleral icterus. Extraocular muscles intact. Oropharynx and nasopharynx clear.  NECK:  Supple, no jugular venous distention. No thyroid enlargement. LUNGS: + Crackles and wheezing present in the left lung base. No use of accessory muscles of respiration.  Nasal cannula in place. CARDIOVASCULAR: RRR, S1, S2 normal. No murmurs, rubs, or gallops.  ABDOMEN: Soft, nontender, nondistended. Bowel sounds present.  EXTREMITIES: No pedal edema, cyanosis, or clubbing.  NEUROLOGIC: CN 2-12 intact, no focal deficits. +global weakness. Sensation intact throughout. Gait not checked.  PSYCHIATRIC: The patient is alert and oriented x 3.  SKIN: No obvious rash, lesion, or ulcer.  LABORATORY PANEL:  Male CBC Recent Labs  Lab 07/28/19 0453  WBC 9.5  HGB 8.5*  HCT 27.0*  PLT 78*   ------------------------------------------------------------------------------------------------------------------ Chemistries  Recent Labs  Lab 07/27/19 1106 07/27/19 1610 07/28/19 0453  NA 140  --  140  K 5.0  --  4.8  CL 108  --  108  CO2 24  --  15*  GLUCOSE 280*  --  265*  BUN 72*  --  60*  CREATININE 1.12  --  0.91  CALCIUM 9.1  --  8.3*  MG  --  2.2  --   AST 43*  --   --   ALT 51*  --   --   ALKPHOS 190*   --   --   BILITOT 1.1  --   --    RADIOLOGY:  No results found. ASSESSMENT AND PLAN:   Acute respiratory failure with hypoxia due to left-sided pneumonia- initially thought to be secondary to HCAP, but concern for aspiration pneumonia as I witnessed patient coughing and choking on his breakfast this morning. -Procalcitonin was low, will recheck in the morning -Patient has been on cefepime.  Tried to change to Unasyn, but patient has a penicillin allergy.  Pharmacy recommending ceftriaxone and azithromycin. -SLP consult -Wean O2 as able  COPD-stable, no signs of acute exacerbation -Albuterol as needed  Anemia of chronic disease- hemoglobin close to baseline -Check anemia panel  Type 2 diabetes- blood sugars have been elevated -Continue SSI  History of CVA -Continue aspirin and lipitor  Dementia- stable -Supportive care  Chronic thrombocytopenia- platelets are at baseline.  No active bleeding. -Monitor  Sacral skin breakdown- due to incontinence. Present on admission. -Wound care RN recommending low air loss mattress, turn every 2 hours, no sacral foam dressing, can use one dermatherapy pad beneath patient's buttocks  Attempted to call patient's son, Louie Casa, but I had to leave a voicemail. Plan is for discharge back to [facility] when medically stable.  All the records are reviewed and case discussed with Care Management/Social Worker. Management plans discussed with the patient, family and they are in agreement.  CODE STATUS: DNR  TOTAL TIME TAKING CARE OF THIS PATIENT: 40 minutes.   More than 50% of the time was spent in counseling/coordination of care: YES  POSSIBLE D/C IN 1-2 DAYS, DEPENDING ON CLINICAL CONDITION.   Berna Spare Rosie Torrez M.D on 07/28/2019 at 11:58 AM  Between 7am to 6pm - Pager - [phone]  After 6pm go to [email or web address] - Proofreader  [facility] [place] [facility]  Office  [phone]  CC: Primary care physician; Butler Denmark,  MD  Note: This dictation was prepared with Dragon dictation along with smaller phrase technology. Any transcriptional errors that result from this process are unintentional.

## 2019-07-28 NOTE — Consult Note (Signed)
[place] Nurse wound consult note Patient receiving care in [facility].  Assisted with turning by NT. Reason for Consult: sacral evaluation Wound type: No wound, but MASD from incontinence  Area is moist, has a sacral foam dressing in place.  There were 2 disposable underpads and 2 linen underpads.  The disposable were removed and discarded. Interventions include: low air loss mattress, turn every 2 hours avoiding prolonged pressure to sacrum/coccyx, not using a sacral foam dressing, and Use ONLY ONE DermaTherapy pad beneath patient's buttocks. Do NOT use disposable pads.  Thank you for the consult.  Discussed plan of care with the patient and bedside nurse.  [place] nurse will not follow at this time.  Please re-consult the [place] team if needed.  Val Riles, RN, MSN, CWOCN, CNS-BC, pager [phone]

## 2019-07-28 NOTE — Progress Notes (Addendum)
Initial Nutrition Assessment  DOCUMENTATION CODES:   Not applicable  INTERVENTION:   Nepro Shake po BID, each supplement provides 425 kcal and 19 grams protein  MVI daily   NUTRITION DIAGNOSIS:   Inadequate oral intake related to acute illness as evidenced by meal completion < 50%.  GOAL:   Patient will meet greater than or equal to 90% of their needs  MONITOR:   PO intake, Supplement acceptance, Labs, Weight trends, Skin, I & O's  REASON FOR ASSESSMENT:   Malnutrition Screening Tool    ASSESSMENT:   85 y/o male with h/o dementia, resides at [facility], CVA, MDD, COPD, DM admitted with SOB and found to have possible aspiration PNA  RD working remotely.  Unable to speak with patient r/t dementia. Per chart review, pt eating ~50% of meals today in hospital. Pt noted to be coughing/choking on breakfast this morning; there are concerns that he may be aspirating. SLP evaluation pending. RD will order supplements to help pt meet his estimated needs. Will order Nepro in setting of aspiration and for glycemic control. There is no recent weight history in chart to determine if any recent weight loss but patient appears to have lost 20lbs(10%) over the past year.  Pt at risk for malnutrition but unable to diagnose at this time as NFPE cannot be performed.    Medications reviewed and include: aspirin, lovenox, ferrous sulfate, insulin, Mg oxide, melatonin, protonix, miralax, azithromycin, ceftriaxone   Labs reviewed: BUN 60(H) BNP- 1059(H)- 10/13 Hgb 8.5(L), Hct 27.0(L) cbgs- 288, 297 x 24 hrs AIC 7.6(H) -10/13  Unable to complete Nutrition-Focused physical exam at this time.   Diet Order:   Diet Order            DIET DYS 3 Room service appropriate? Yes with Assist; Fluid consistency: Thin  Diet effective now             EDUCATION NEEDS:   Not appropriate for education at this time  Skin:  Skin Assessment: Reviewed RN Assessment(Stage I sacrum)  Last BM:   PTA  Height:   Ht Readings from Last 1 Encounters:  07/27/19 6\' 3"  (1.905 m)    Weight:   Wt Readings from Last 1 Encounters:  07/27/19 81.6 kg    Ideal Body Weight:  89 kg  BMI:  Body mass index is 22.5 kg/m.  Estimated Nutritional Needs:   Kcal:  2100-2400kcal/day  Protein:  105-120g/day  Fluid:  2L/day  Koleen Distance MS, RD, LDN Pager #- [phone] Office#- [phone] After Hours Pager: [phone]

## 2019-07-29 ENCOUNTER — Encounter: Payer: Self-pay | Admitting: Primary Care

## 2019-07-29 DIAGNOSIS — A419 Sepsis, unspecified organism: Secondary | ICD-10-CM

## 2019-07-29 DIAGNOSIS — Z7189 Other specified counseling: Secondary | ICD-10-CM

## 2019-07-29 DIAGNOSIS — Z515 Encounter for palliative care: Secondary | ICD-10-CM

## 2019-07-29 LAB — CBC
HCT: 28.2 % — ABNORMAL LOW (ref 39.0–52.0)
Hemoglobin: 8.8 g/dL — ABNORMAL LOW (ref 13.0–17.0)
MCH: 29 pg (ref 26.0–34.0)
MCHC: 31.2 g/dL (ref 30.0–36.0)
MCV: 93.1 fL (ref 80.0–100.0)
Platelets: 83 10*3/uL — ABNORMAL LOW (ref 150–400)
RBC: 3.03 MIL/uL — ABNORMAL LOW (ref 4.22–5.81)
RDW: 18.4 % — ABNORMAL HIGH (ref 11.5–15.5)
WBC: 9.9 10*3/uL (ref 4.0–10.5)
nRBC: 0.3 % — ABNORMAL HIGH (ref 0.0–0.2)

## 2019-07-29 LAB — BASIC METABOLIC PANEL
Anion gap: 12 (ref 5–15)
BUN: 55 mg/dL — ABNORMAL HIGH (ref 8–23)
CO2: 20 mmol/L — ABNORMAL LOW (ref 22–32)
Calcium: 8.3 mg/dL — ABNORMAL LOW (ref 8.9–10.3)
Chloride: 108 mmol/L (ref 98–111)
Creatinine, Ser: 0.97 mg/dL (ref 0.61–1.24)
GFR calc Af Amer: >60 mL/min (ref >60)
GFR calc non Af Amer: >60 mL/min (ref >60)
Glucose, Bld: 326 mg/dL — ABNORMAL HIGH (ref 70–99)
Potassium: 4.3 mmol/L (ref 3.5–5.1)
Sodium: 140 mmol/L (ref 135–145)

## 2019-07-29 LAB — FOLATE: Folate: 39 ng/mL (ref >5.9)

## 2019-07-29 LAB — IRON AND TIBC
Iron: 23 ug/dL — ABNORMAL LOW (ref 45–182)
Saturation Ratios: 13 % — ABNORMAL LOW (ref 17.9–39.5)
TIBC: 180 ug/dL — ABNORMAL LOW (ref 250–450)
UIBC: 157 ug/dL

## 2019-07-29 LAB — GLUCOSE, CAPILLARY
Glucose-Capillary: 300 mg/dL — ABNORMAL HIGH (ref 70–99)
Glucose-Capillary: 272 mg/dL — ABNORMAL HIGH (ref 70–99)

## 2019-07-29 LAB — FERRITIN: Ferritin: 179 ng/mL (ref 24–336)

## 2019-07-29 LAB — VITAMIN B12: Vitamin B-12: 818 pg/mL (ref 180–914)

## 2019-07-29 LAB — PROCALCITONIN: Procalcitonin: 0.39 ng/mL

## 2019-07-29 MED ORDER — ARTIFICIAL TEARS OPHTHALMIC OINT
TOPICAL_OINTMENT | Freq: Every evening | OPHTHALMIC | Status: DC | PRN
  Filled 2019-07-29: qty 3.5

## 2019-07-29 MED ORDER — POLYVINYL ALCOHOL 1.4 % OP SOLN
1.0000 [drp] | OPHTHALMIC | Status: DC | PRN
  Filled 2019-07-29: qty 15

## 2019-07-29 MED ORDER — MORPHINE SULFATE (CONCENTRATE) 10 MG/0.5ML PO SOLN: 2.6000 mg | ORAL | Status: DC | PRN

## 2019-07-29 NOTE — Progress Notes (Signed)
Inpatient Diabetes Program Recommendations  AACE/ADA: New Consensus Statement on Inpatient Glycemic Control   Target Ranges:  Prepandial:   less than 140 mg/dL      Peak postprandial:   less than 180 mg/dL (1-2 hours)      Critically ill patients:  140 - 180 mg/dL   Results for HANIEL, FIX (MRN [id number]) as of 07/29/2019 08:16  Ref. Range 07/28/2019 08:13 07/28/2019 12:26 07/28/2019 16:34 07/28/2019 20:40 07/29/2019 08:01  Glucose-Capillary Latest Ref Range: 70 - 99 mg/dL 288 (H) 297 (H) 340 (H) 297 (H) 300 (H)  Results for ROMELO, SCIANDRA (MRN [id number]) as of 07/29/2019 08:16  Ref. Range 07/27/2019 16:10  Hemoglobin A1C Latest Ref Range: 4.8 - 5.6 % 7.6 (H)   Review of Glycemic Control  Diabetes history: DM2 Outpatient Diabetes medications: Metformin XR 500 mg BID Current orders for Inpatient glycemic control: Novolog 0-9 units TID with meals, Novolog 0-5 units QHS  Inpatient Diabetes Program Recommendations:   Insulin-Basal: Please consider ordering Levemir 8 units daily (based on 81.6 kg x 0.1 units).  Diet: If appropriate, please consider adding Carb Modified to current Dys 3 diet.  Thanks, Barnie Alderman, RN, MSN, CDE Diabetes Coordinator Inpatient Diabetes Program [phone] (Team Pager from 8am to 5pm)

## 2019-07-29 NOTE — Progress Notes (Signed)
Patient ID: [name], male   DOB: Aug 16, 1933, 85 y.o.   MRN: [id number]  [facility] PROGRESS NOTE  [name] XNA:[id number] DOB: [birth year] DOA: 07/27/2019 PCP: Butler Denmark, MD  HPI/Subjective: Patient answered a few yes or no questions.  Did not look good.  Objective: Vitals:   07/29/19 0433 07/29/19 0801  BP: (!) 112/47 (!) 115/52  Pulse: 80 72  Resp: (!) 22   Temp: 97.9 F (36.6 C) (!) 97.4 F (36.3 C)  SpO2: 96% 100%    Intake/Output Summary (Last 24 hours) at 07/29/2019 1534 Last data filed at 07/29/2019 1307 Gross per 24 hour  Intake 240 ml  Output 400 ml  Net -160 ml   Filed Weights   07/27/19 1100  Weight: 81.6 kg    ROS: Review of Systems  Unable to perform ROS: Acuity of condition  Respiratory: Negative for shortness of breath.   Cardiovascular: Negative for chest pain.  Gastrointestinal: Negative for abdominal pain.   Exam: Physical Exam  Constitutional: He appears lethargic.  HENT:  Nose: No mucosal edema.  Mouth/Throat: No oropharyngeal exudate.  Eyes: Lids are normal. Right pupil is not reactive.  Neck: Carotid bruit is not present.  Cardiovascular: Regular rhythm, S1 normal, S2 normal and normal heart sounds.  Respiratory: He has decreased breath sounds in the right lower field and the left lower field. He has rhonchi in the right lower field and the left lower field.  GI: Soft. Bowel sounds are normal. There is no abdominal tenderness.  Musculoskeletal:     Right ankle: He exhibits swelling.     Left ankle: He exhibits swelling.  Neurological: He appears lethargic.  Able to move his arms and wiggle his toes  Skin: Skin is dry. No rash noted.  Psychiatric:  Lethargic      Data Reviewed: Basic Metabolic Panel: Recent Labs  Lab 07/27/19 1106 07/27/19 1610 07/28/19 0453 07/29/19 0601  NA 140  --  140 140  K 5.0  --  4.8 4.3  CL 108  --  108 108  CO2 24  --  15* 20*  GLUCOSE 280*  --  265* 326*  BUN 72*  --  60*  55*  CREATININE 1.12  --  0.91 0.97  CALCIUM 9.1  --  8.3* 8.3*  MG  --  2.2  --   --    Liver Function Tests: Recent Labs  Lab 07/27/19 1106  AST 43*  ALT 51*  ALKPHOS 190*  BILITOT 1.1  PROT 6.8  ALBUMIN 2.8*   CBC: Recent Labs  Lab 07/27/19 1106 07/28/19 0453 07/29/19 0601  WBC 9.8 9.5 9.9  NEUTROABS 7.3  --   --   HGB 8.6* 8.5* 8.8*  HCT 27.5* 27.0* 28.2*  MCV 92.3 90.9 93.1  PLT 76* 78* 83*   BNP (last 3 results) Recent Labs    07/27/19 1106  BNP 1,059.0*     CBG: Recent Labs  Lab 07/28/19 1226 07/28/19 1634 07/28/19 2040 07/29/19 0801 07/29/19 1211  GLUCAP 297* 340* 297* 300* 272*    Recent Results (from the past 240 hour(s))  Culture, blood (routine x 2)     Status: None (Preliminary result)   Collection Time: 07/27/19 11:06 AM   Specimen: BLOOD  Result Value Ref Range Status   Specimen Description BLOOD LEFT FA  Final   Special Requests   Final    BOTTLES DRAWN AEROBIC AND ANAEROBIC Blood Culture adequate volume   Culture   Final  NO GROWTH 2 DAYS Performed at [facility], [address] [place], [place] [address]    Report Status PENDING  Incomplete  Culture, blood (routine x 2)     Status: None (Preliminary result)   Collection Time: 07/27/19 11:06 AM   Specimen: BLOOD  Result Value Ref Range Status   Specimen Description BLOOD LEFT HAND  Final   Special Requests   Final    BOTTLES DRAWN AEROBIC AND ANAEROBIC Blood Culture results may not be optimal due to an excessive volume of blood received in culture bottles   Culture   Final    NO GROWTH 2 DAYS Performed at [facility], [address], [place], [place] [address]    Report Status PENDING  Incomplete  SARS Coronavirus 2 by RT PCR (hospital order, performed in [facility] hospital lab) Nasopharyngeal Nasopharyngeal Swab     Status: None   Collection Time: 07/27/19 11:06 AM   Specimen: Nasopharyngeal Swab  Result Value Ref Range Status   SARS Coronavirus  2 NEGATIVE NEGATIVE Final    Comment: (NOTE) If result is NEGATIVE SARS-CoV-2 target nucleic acids are NOT DETECTED. The SARS-CoV-2 RNA is generally detectable in upper and lower  respiratory specimens during the acute phase of infection. The lowest  concentration of SARS-CoV-2 viral copies this assay can detect is 250  copies / mL. A negative result does not preclude SARS-CoV-2 infection  and should not be used as the sole basis for treatment or other  patient management decisions.  A negative result may occur with  improper specimen collection / handling, submission of specimen other  than nasopharyngeal swab, presence of viral mutation(s) within the  areas targeted by this assay, and inadequate number of viral copies  (<250 copies / mL). A negative result must be combined with clinical  observations, patient history, and epidemiological information. If result is POSITIVE SARS-CoV-2 target nucleic acids are DETECTED. The SARS-CoV-2 RNA is generally detectable in upper and lower  respiratory specimens dur ing the acute phase of infection.  Positive  results are indicative of active infection with SARS-CoV-2.  Clinical  correlation with patient history and other diagnostic information is  necessary to determine patient infection status.  Positive results do  not rule out bacterial infection or co-infection with other viruses. If result is PRESUMPTIVE POSTIVE SARS-CoV-2 nucleic acids MAY BE PRESENT.   A presumptive positive result was obtained on the submitted specimen  and confirmed on repeat testing.  While 2019 novel coronavirus  (SARS-CoV-2) nucleic acids may be present in the submitted sample  additional confirmatory testing may be necessary for epidemiological  and / or clinical management purposes  to differentiate between  SARS-CoV-2 and other Sarbecovirus currently known to infect humans.  If clinically indicated additional testing with an alternate test  methodology  [phone]) is advised. The SARS-CoV-2 RNA is generally  detectable in upper and lower respiratory sp ecimens during the acute  phase of infection. The expected result is Negative. Fact Sheet for Patients:  BoilerBrush.com.cy Fact Sheet for Healthcare Providers: https://pope.com/ This test is not yet approved or cleared by the [place] FDA and has been authorized for detection and/or diagnosis of SARS-CoV-2 by FDA under an Emergency Use Authorization (EUA).  This EUA will remain in effect (meaning this test can be used) for the duration of the COVID-19 declaration under Section 564(b)(1) of the Act, 21 U.S.C. section 360bbb-3(b)(1), unless the authorization is terminated or revoked sooner. Performed at [facility], [address] [place], [place] [address]   MRSA PCR Screening     Status: None   Collection Time: 07/27/19  3:42 PM   Specimen: Nasal Mucosa; Nasopharyngeal  Result Value Ref Range Status   MRSA by PCR NEGATIVE NEGATIVE Final    Comment:        The GeneXpert MRSA Assay (FDA approved for NASAL specimens only), is one component of a comprehensive MRSA colonization surveillance program. It is not intended to diagnose MRSA infection nor to guide or monitor treatment for MRSA infections. Performed at [facility], [address] [place], [place] [address]       Scheduled Meds: . feeding supplement (NEPRO CARB STEADY)  237 mL Oral BID BM  . gabapentin  300 mg Oral BID  . senna-docusate  2 tablet Oral BID  . tamsulosin  0.4 mg Oral Daily  . venlafaxine XR  150 mg Oral Q breakfast   Continuous Infusions:  Assessment/Plan:  1. Acute hypoxic respiratory failure.  Continue oxygen supplementation 2. Aspiration pneumonia.  Antibiotics stopped by palliative 3. COPD.  On nebulizer inhalers 4. Anemia of chronic disease 5. Type 2 diabetes mellitus 6. History of CVA 7. Dementia 8. Chronic  thrombocytopenia 9. Sacral skin breakdown secondary to incontinence  Since the patient did not look good this morning I got palliative care to see the patient and family is interested in comfort at this point if he is not doing well.  They are looking into hospice home in [place].  Currently no beds available today.  Potential disposition tomorrow if there is a bed.  Code Status:     Code Status Orders  (From admission, onward)         Start     Ordered   07/27/19 1520  Do not attempt resuscitation (DNR)  Continuous    Question Answer Comment  In the event of cardiac or respiratory ARREST Do not call a "code blue"   In the event of cardiac or respiratory ARREST Do not perform Intubation, CPR, defibrillation or ACLS   In the event of cardiac or respiratory ARREST Use medication by any route, position, wound care, and other measures to relive pain and suffering. May use oxygen, suction and manual treatment of airway obstruction as needed for comfort.      07/27/19 1519        Code Status History    Date Active Date Inactive Code Status Order ID Comments User Context   02/17/2018 2251 02/20/2018 2008 DNR [id number]  Cammy CopaMaier, Angela, MD Inpatient   07/12/2015 0645 07/14/2015 2009 DNR [id number]  Arnaldo Nataliamond, Michael S, MD ED   07/12/2015 0627 07/12/2015 0645 Full Code [id number]  Arnaldo Nataliamond, Michael S, MD ED   11/07/2013 1004 11/08/2013 1420 DNR [id number]  Fredirick MaudlinHawkins, Edward L, MD Inpatient   11/06/2013 0247 11/07/2013 1004 Full Code [id number]  Haydee Monicaavid, Rachal A, MD Inpatient   11/30/2011 0106 11/30/2011 1958 Full Code [id number]  Joya GaskinsWickline, Donald W, MD ED   Advance Care Planning Activity     Family Communication: Spoke with daughter on the phone  disposition Plan: Potentially out to hospice home tomorrow  Time spent: 28 minutes  Ruqayya Ventress [employer]  [facility] [employer]

## 2019-07-29 NOTE — Consult Note (Signed)
Consultation Note Date: 07/29/2019   Patient Name: [name]  DOB: July 10, 1933  MRN: [id number]  Age / Sex: 85 y.o., male  PCP: William Gula, MD Referring Physician: Alford Highland, MD  Reason for Consultation: Establishing goals of care and Hospice Evaluation  HPI/Patient Profile: 85 y.o. 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   Clinical Assessment and Goals of Care: [name] is lying quietly in bed.  He appears acutely/chronically ill and frail.  It takes several tries for me to wake him.  He is nonverbal with me at this time.  He denies pain or breathlessness.  There is no family at this time.  During conference with RN case manager, daughter/HC POA, [name] arrives.  [name] talks about her father's decline since he first had a stroke in 2012, then vascular dementia diagnosis in December 2012 that led to an ALF stay.  A fall in January 2015 left him bedbound in SNF.  She shares that his wife [name]) passed away with a stroke in 05/24/15.  We talked about [name] acute and chronic health issues in detail.  We talked about the benefits of hospice, comfort and dignity at end-of-life, let nature take its course.  [name] states that her father would not want to continue to live in this condition, and she readily agrees to residential hospice.  Choice offered, as family lives in [place] they choose hospice of [place].  Conference with attending and RN case management related to patient condition, needs, disposition to residential hospice.  HCPOA   HCPOA -daughter, [name].  [name] also has a son, [name], who is very involved in  decision-making.   SUMMARY OF RECOMMENDATIONS   Comfort and dignity at end-of-life, residential hospice in [name]  Code Status/Advance Care Planning:  DNR  Symptom Management:   Per hospice protocol   Palliative Prophylaxis:   Frequent Pain Assessment and Turn Reposition  Additional Recommendations (Limitations, Scope, Preferences):  Full Comfort Care  Psycho-social/Spiritual:   Desire for further Chaplaincy support:no  Additional Recommendations: Caregiving  Support/Resources and Education on Hospice  Prognosis:   < 2 weeks based on poor functional status, PNE, poor by mouth intake, family desire to focus on comfort and dignity, let nature take it's course.    Discharge Planning: Requesting residential hospice with [name].      Primary Diagnoses: Present on Admission:  Pneumonia   I have reviewed the medical record, interviewed the patient and family, and examined the patient. The following aspects are pertinent.  Past Medical History:  Diagnosis Date   COPD (chronic obstructive pulmonary disease) (HCC)    CVA (cerebral infarction)    Diabetes mellitus    GI bleed    Hypertension    MDD (major depressive disorder)    w/ paranoia   PTSD (  post-traumatic stress disorder)    Vascular dementia [name])    Social History   Socioeconomic History   Marital status: Widowed    Spouse name: Not on file   Number of children: Not on file   Years of education: Not on file   Highest education level: Not on file  Occupational History   Not on file  Social Needs   Financial resource strain: Not hard at all   Food insecurity    Worry: Never true    Inability: Never true   Transportation needs    Medical: No    Non-medical: No  Tobacco Use   Smoking status: Never Smoker   Smokeless tobacco: Never Used  Substance and Sexual Activity   Alcohol use: No   Drug use: No   Sexual activity: Never  Lifestyle   Physical  activity    Days per week: 0 days    Minutes per session: 0 min   Stress: Not at all  Relationships   Social connections    Talks on phone: Not on file    Gets together: Not on file    Attends religious service: Not on file    Active member of club or organization: Not on file    Attends meetings of clubs or organizations: Not on file    Relationship status: Not on file  Other Topics Concern   Not on file  Social History Narrative   Not on file   History reviewed. No pertinent family history. Scheduled Meds:  aspirin EC  81 mg Oral Daily   atorvastatin  40 mg Oral QHS   enoxaparin (LOVENOX) injection  40 mg Subcutaneous Q24H   feeding supplement (NEPRO CARB STEADY)  237 mL Oral BID BM   ferrous sulfate  325 mg Oral Q M,W,F   gabapentin  300 mg Oral BID   insulin aspart  0-5 Units Subcutaneous QHS   insulin aspart  0-9 Units Subcutaneous TID WC   magnesium oxide  400 mg Oral Daily   Melatonin  5 mg Oral QHS   multivitamin with minerals  1 tablet Oral Daily   mupirocin ointment  1 application Nasal BID   pantoprazole  40 mg Oral BID AC   polyethylene glycol  17 g Oral QPM   senna-docusate  2 tablet Oral BID   tamsulosin  0.4 mg Oral Daily   venlafaxine XR  150 mg Oral Q breakfast   Continuous Infusions:  azithromycin 500 mg (07/28/19 1251)   cefTRIAXone (ROCEPHIN)  IV 1 g (07/28/19 1130)   PRN Meds:.albuterol, guaiFENesin-dextromethorphan, senna-docusate Medications Prior to Admission:  Prior to Admission medications   Medication Sig Start Date End Date Taking? Authorizing Provider  acetaminophen (TYLENOL) 325 MG tablet Take 650 mg by mouth every 8 (eight) hours.    Yes [provider]  albuterol (PROVENTIL HFA;VENTOLIN HFA) 108 (90 Base) MCG/ACT inhaler Inhale 1 puff into the lungs every 4 (four) hours as needed for shortness of breath.    Yes [provider]  aspirin EC 81 MG tablet Take 81 mg by mouth daily.   Yes [provider]  atorvastatin (LIPITOR) 40 MG tablet Take 40 mg by mouth at bedtime.    Yes [provider]  ferrous sulfate 324 MG TBEC Take 324 mg by mouth 3 (three) times a week. Mon,wed, fri   Yes [provider]  gabapentin (NEURONTIN) 300 MG capsule Take 300 mg by mouth 2 (two) times daily.   Yes [provider]  ipratropium (ATROVENT HFA) 17 MCG/ACT inhaler Inhale 2 puffs into the lungs every 6 (six) hours as needed for wheezing.   Yes [provider]  ketoconazole (NIZORAL) 2 % shampoo Apply 1 application topically as directed. Apply to wet scalp and face twice weekly as needed for dandruff/ flakey skin   Yes [provider]  magnesium oxide (MAG-OX) 400 (241.3 MG) MG tablet Take 2 tablets (800 mg total) by mouth daily. Patient taking differently: Take 400 mg by mouth daily.  11/07/13  Yes Kari BaarsHawkins, Edward, MD  Melatonin 3 MG TABS Take 3 mg by mouth at bedtime.   Yes [provider]  metFORMIN (GLUCOPHAGE-XR) 500 MG 24 hr tablet Take 500 mg by mouth 2 (two) times daily as needed.   Yes [provider]  miconazole (MICOTIN) 2 % powder Apply topically 2 (two) times daily as needed for itching (for fungus). Perineal,groin,sacrum   Yes [provider]  Multiple Vitamins-Minerals (CERTAVITE/ANTIOXIDANTS) TABS Take 1 tablet by mouth daily.   Yes [provider]  pantoprazole (PROTONIX) 40 MG tablet Take 1 tablet (40 mg total) by mouth daily. Patient taking differently: Take 40 mg by mouth 2 (two) times daily before a meal.  11/07/13  Yes Kari BaarsHawkins, Edward, MD  polyethylene glycol (MIRALAX / GLYCOLAX) packet Take 17 g by mouth every evening.    Yes [provider]  sennosides-docusate sodium (SENOKOT-S) 8.6-50 MG tablet Take 2 tablets by mouth 2 (two) times daily.    Yes [provider]  tamsulosin (FLOMAX) 0.4 MG CAPS capsule Take 0.4 mg by mouth daily.    Yes [provider]  venlafaxine XR  (EFFEXOR-XR) 150 MG 24 hr capsule Take 1 capsule (150 mg total) by mouth daily with breakfast. 11/07/13  Yes Kari BaarsHawkins, Edward, MD   Allergies  Allergen Reactions   Yellow Jacket Venom Anaphylaxis   Aggrenox [Aspirin-Dipyridamole Er] Other (See Comments)    Reaction: unknown   Bactrim [Sulfamethoxazole-Trimethoprim] Other (See Comments)    Reaction: unknown   Betadine Prepstick Plus Other (See Comments)    Reaction: unknown   Contrast Media [Iodinated Diagnostic Agents] Other (See Comments)    Reaction: unknown. Patient states that he does not remember [facility], but [facility]   Cortisone Other (See Comments)    Reaction: unknown   Penicillins Other (See Comments)    Reaction: unknown Has patient had a PCN reaction causing immediate rash, facial/tongue/throat swelling, SOB or lightheadedness with hypotension: Unknown Has patient had a PCN reaction causing severe rash involving mucus membranes or skin necrosis: Unknown Has patient had a PCN reaction that required hospitalization: Unknown Has patient had a PCN reaction occurring within the last 10 years: Unknown If all of the above answers are "NO", then may proceed with Cephalosporin use.   Salsalate Other (See Comments)    Reaction: unknown   Sulfa Antibiotics Other (See Comments)    Reaction: unknown   Tegretol [Carbamazepine] Other (See Comments)    Reaction: unknown   Review of Systems  Unable to perform ROS: Acuity of condition    Physical Exam Vitals signs and nursing note reviewed.  Constitutional:      Appearance: He is ill-appearing.  Cardiovascular:     Rate and Rhythm: Normal rate.  Pulmonary:     Effort: Tachypnea present. No accessory muscle usage or respiratory distress.  Abdominal:     Palpations: Abdomen is soft.     Tenderness: There is no guarding.  Musculoskeletal:  Right lower leg: No edema.     Left lower leg: No edema.  Skin:    General: Skin is warm and dry.    Neurological:     Comments: Non verbal at this time     Vital Signs: BP (!) 115/52 (BP Location: Left Arm)    Pulse 72    Temp (!) 97.4 F (36.3 C) (Oral)    Resp (!) 22    Ht 6\' 3"  (1.905 m)    Wt 81.6 kg    SpO2 100%    BMI 22.50 kg/m  Pain Scale: 0-10   Pain Score: 0-No pain   SpO2: SpO2: 100 % O2 Device:SpO2: 100 % O2 Flow Rate: .O2 Flow Rate (L/min): 2 L/min  IO: Intake/output summary:   Intake/Output Summary (Last 24 hours) at 07/29/2019 1219 Last data filed at 07/28/2019 1700 Gross per 24 hour  Intake 240 ml  Output --  Net 240 ml    LBM: Last BM Date: (unknown) Baseline Weight: Weight: 81.6 kg Most recent weight: Weight: 81.6 kg     Palliative Assessment/Data:   Flowsheet Rows     Most Recent Value  Intake Tab  Referral Department  Hospitalist  Unit at Time of Referral  Med/Surg Unit  Palliative Care Primary Diagnosis  Pulmonary  Date Notified  07/29/19  Palliative Care Type  New Palliative care  Date of Admission  07/27/19  Date first seen by Palliative Care  07/29/19  # of days Palliative referral response time  0 Day(s)  # of days IP prior to Palliative referral  2  Clinical Assessment  Palliative Performance Scale Score  30%  Pain Max last 24 hours  Not able to report  Pain Min Last 24 hours  Not able to report  Dyspnea Max Last 24 Hours  Not able to report  Dyspnea Min Last 24 hours  Not able to report  Psychosocial & Spiritual Assessment  Palliative Care Outcomes      Time In: 1055 Time Out: 1205 Time Total: 70 minutes Greater than 50%  of this time was spent counseling and coordinating care related to the above assessment and plan.  Signed by: Drue Novel, NP   Please contact Palliative Medicine Team phone at [phone] for questions and concerns.  For individual provider: See Shea Evans

## 2019-07-29 NOTE — TOC Progression Note (Signed)
Transition of Care [facility]) - Progression Note    Patient Details  Name: [name] MRN: [id number] Date of Birth: 1933-04-09  Transition of Care [facility]) CM/SW Contact  Shelbie Hutching, RN Phone Number: 07/29/2019, 1:28 PM  Clinical Narrative:    Palliative spoke with patient's daughter, Nestor Ramp, at the bedside today.  Neoma Laming after speaking with palliative has decided that residential hospice is what is best for the patient.  Patient is DNR and will be placed on comfort care.  Patient's daughter chooses residential hospice in [place].  [facility] in [place], referral has been faxed to [phone].   Expected Discharge Plan: [place] Barriers to Discharge: Continued Medical Work up  Expected Discharge Plan and Services Expected Discharge Plan: [place]   Discharge Planning Services: CM Consult Post Acute Care Choice: Hospice Living arrangements for the past 2 months: [place]                                       Social Determinants of Health (SDOH) Interventions    Readmission Risk Interventions No flowsheet data found.

## 2019-07-29 NOTE — TOC Progression Note (Signed)
Transition of Care [facility]) - Progression Note    Patient Details  Name: [name] MRN: [id number] Date of Birth: 1933-07-20  Transition of Care [facility]) CM/SW Contact  Shelbie Hutching, RN Phone Number: 07/29/2019, 2:42 PM  Clinical Narrative:     [facility] has received residential hospice referral.  The hospice home does not currently have any beds available.  RNCM has updated the patient's family.  TOC team will cont to follow and facilitate discharge to hospice home once bed becomes available.   Expected Discharge Plan: [place] Barriers to Discharge: Continued Medical Work up  Expected Discharge Plan and Services Expected Discharge Plan: [place]   Discharge Planning Services: CM Consult Post Acute Care Choice: Hospice Living arrangements for the past 2 months: [place]                                       Social Determinants of Health (SDOH) Interventions    Readmission Risk Interventions No flowsheet data found.

## 2019-07-30 MED ORDER — MORPHINE SULFATE (CONCENTRATE) 10 MG/0.5ML PO SOLN: 2.6000 mg | ORAL | 0 refills | Status: AC | PRN

## 2019-07-30 MED ORDER — POLYVINYL ALCOHOL 1.4 % OP SOLN: 1.0000 [drp] | OPHTHALMIC | 0 refills | Status: AC | PRN

## 2019-07-30 MED ORDER — NEPRO/CARBSTEADY PO LIQD: 237.0000 mL | Freq: Two times a day (BID) | ORAL | 0 refills | Status: AC

## 2019-07-30 NOTE — Care Management Important Message (Signed)
Important Message  Patient Details  Name: [name] MRN: [id number] Date of Birth: 1933-06-07   Medicare Important Message Given:  Yes     Dannette Barbara 07/30/2019, 12:16 PM

## 2019-07-30 NOTE — Discharge Summary (Signed)
[facility] - [place] [facility]   PATIENT NAME: [name]    MR#:  [id number]  DATE OF BIRTH:  [birth year]  DATE OF ADMISSION:  07/27/2019 ADMITTING PHYSICIAN: Shaune Pollack, MD  DATE OF DISCHARGE: 07/30/2019  PRIMARY CARE PHYSICIAN: Evangeline Gula, MD    ADMISSION DIAGNOSIS:  Sepsis, due to unspecified organism, unspecified whether acute organ dysfunction present (HCC) [A41.9]  DISCHARGE DIAGNOSIS:  Active Problems:   Pneumonia   Pressure injury of skin   SECONDARY DIAGNOSIS:   Past Medical History:  Diagnosis Date  . COPD (chronic obstructive pulmonary disease) (HCC)   . CVA (cerebral infarction)   . Diabetes mellitus   . GI bleed   . Hypertension   . MDD (major depressive disorder)    w/ paranoia  . PTSD (post-traumatic stress disorder)   . Vascular dementia [facility])     HOSPITAL COURSE:   1.  Acute hypoxic respiratory failure.  Continue oxygen supplementation 2 L nasal cannula. 2.  Aspiration pneumonia.  The patient was started on antibiotics but they were stopped by palliative care since we are thinking more comfort care. 3.  COPD.  Continue inhalers 4.  Anemia of chronic disease 5.  Type 2 diabetes mellitus 6.  History of CVA nonhemorrhagic  7.  Dementia 8.  Chronic thrombocytopenia 9.  Sacral skin breakdown secondary to incontinence.  Patient's daughter changed her mind about the hospice home.  She would like the patient to go back to [facility] under hospice services.  Patient is on dysphagia diet with thin liquids.  Overall prognosis poor with swallowing issues and aspiration pneumonia.  Comfort measures were ordered here in the hospital.  Appreciate palliative care consultation.  Keep condom catheter on to keep skin breakdown at a minimum  Hospice to follow patient at facility   DISCHARGE CONDITIONS:   Guarded  CONSULTS OBTAINED:  None  DRUG ALLERGIES:   Allergies  Allergen Reactions  . Yellow Jacket Venom Anaphylaxis   . Aggrenox [Aspirin-Dipyridamole Er] Other (See Comments)    Reaction: unknown  . Bactrim [Sulfamethoxazole-Trimethoprim] Other (See Comments)    Reaction: unknown  . Betadine Prepstick Plus Other (See Comments)    Reaction: unknown  . Contrast Media [Iodinated Diagnostic Agents] Other (See Comments)    Reaction: unknown. Patient states that he does not remember the reaction, but it happened at the [place]  . Cortisone Other (See Comments)    Reaction: unknown  . Penicillins Other (See Comments)    Reaction: unknown Has patient had a PCN reaction causing immediate rash, facial/tongue/throat swelling, SOB or lightheadedness with hypotension: Unknown Has patient had a PCN reaction causing severe rash involving mucus membranes or skin necrosis: Unknown Has patient had a PCN reaction that required hospitalization: Unknown Has patient had a PCN reaction occurring within the last 10 years: Unknown If all of the above answers are "NO", then may proceed with Cephalosporin use.  . Salsalate Other (See Comments)    Reaction: unknown  . Sulfa Antibiotics Other (See Comments)    Reaction: unknown  . Tegretol [Carbamazepine] Other (See Comments)    Reaction: unknown    DISCHARGE MEDICATIONS:   Allergies as of 07/30/2019      Reactions   Yellow Jacket Venom Anaphylaxis   Aggrenox [aspirin-dipyridamole Er] Other (See Comments)   Reaction: unknown   Bactrim [sulfamethoxazole-trimethoprim] Other (See Comments)   Reaction: unknown   Betadine Prepstick Plus Other (See Comments)   Reaction: unknown   Contrast Media [iodinated Diagnostic Agents]  Other (See Comments)   Reaction: unknown. Patient states that he does not remember the reaction, but it happened at the [place]   Cortisone Other (See Comments)   Reaction: unknown   Penicillins Other (See Comments)   Reaction: unknown Has patient had a PCN reaction causing immediate rash, facial/tongue/throat swelling, SOB or lightheadedness with hypotension:  Unknown Has patient had a PCN reaction causing severe rash involving mucus membranes or skin necrosis: Unknown Has patient had a PCN reaction that required hospitalization: Unknown Has patient had a PCN reaction occurring within the last 10 years: Unknown If all of the above answers are "NO", then may proceed with Cephalosporin use.   Salsalate Other (See Comments)   Reaction: unknown   Sulfa Antibiotics Other (See Comments)   Reaction: unknown   Tegretol [carbamazepine] Other (See Comments)   Reaction: unknown      Medication List    STOP taking these medications   aspirin EC 81 MG tablet   atorvastatin 40 MG tablet Commonly known as: LIPITOR   CertaVite/Antioxidants Tabs   ferrous sulfate 324 MG Tbec   gabapentin 300 MG capsule Commonly known as: NEURONTIN   magnesium oxide 400 (241.3 Mg) MG tablet Commonly known as: MAG-OX   metFORMIN 500 MG 24 hr tablet Commonly known as: GLUCOPHAGE-XR   pantoprazole 40 MG tablet Commonly known as: PROTONIX   polyethylene glycol 17 g packet Commonly known as: MIRALAX / GLYCOLAX     TAKE these medications   acetaminophen 325 MG tablet Commonly known as: TYLENOL Take 650 mg by mouth every 8 (eight) hours.   albuterol 108 (90 Base) MCG/ACT inhaler Commonly known as: VENTOLIN HFA Inhale 1 puff into the lungs every 4 (four) hours as needed for shortness of breath.   feeding supplement (NEPRO CARB STEADY) Liqd Take 237 mLs by mouth 2 (two) times daily between meals. Start taking on: July 31, 2019   ipratropium 17 MCG/ACT inhaler Commonly known as: ATROVENT HFA Inhale 2 puffs into the lungs every 6 (six) hours as needed for wheezing.   ketoconazole 2 % shampoo Commonly known as: NIZORAL Apply 1 application topically as directed. Apply to wet scalp and face twice weekly as needed for dandruff/ flakey skin   Melatonin 3 MG Tabs Take 3 mg by mouth at bedtime.   miconazole 2 % powder Commonly known as: MICOTIN Apply  topically 2 (two) times daily as needed for itching (for fungus). Perineal,groin,sacrum   morphine CONCENTRATE 10 MG/0.5ML Soln concentrated solution Take 0.13-0.25 mLs (2.6-5 mg total) by mouth every 2 (two) hours as needed for moderate pain or severe pain (EOL).   polyvinyl alcohol 1.4 % ophthalmic solution Commonly known as: LIQUIFILM TEARS Place 1 drop into both eyes as needed for dry eyes.   sennosides-docusate sodium 8.6-50 MG tablet Commonly known as: SENOKOT-S Take 2 tablets by mouth 2 (two) times daily.   tamsulosin 0.4 MG Caps capsule Commonly known as: FLOMAX Take 0.4 mg by mouth daily.   venlafaxine XR 150 MG 24 hr capsule Commonly known as: EFFEXOR-XR Take 1 capsule (150 mg total) by mouth daily with breakfast.        DISCHARGE INSTRUCTIONS:   Follow-up at facility with hospice care  If you experience worsening of your admission symptoms, develop shortness of breath, life threatening emergency, suicidal or homicidal thoughts you must seek medical attention immediately by calling 911 or calling your MD immediately  if symptoms less severe.  You Must read complete instructions/literature along with all the possible adverse  reactions/side effects for all the Medicines you take and that have been prescribed to you. Take any new Medicines after you have completely understood and accept all the possible adverse reactions/side effects.   Please note  You were cared for by a hospitalist during your hospital stay. If you have any questions about your discharge medications or the care you received while you were in the hospital after you are discharged, you can call the unit and asked to speak with the hospitalist on call if the hospitalist that took care of you is not available. Once you are discharged, your primary care physician will handle any further medical issues. Please note that NO REFILLS for any discharge medications will be authorized once you are discharged, as it  is imperative that you return to your primary care physician (or establish a relationship with a primary care physician if you do not have one) for your aftercare needs so that they can reassess your need for medications and monitor your lab values.    Today   CHIEF COMPLAINT:   Chief Complaint  Patient presents with  . Shortness of Breath    HISTORY OF PRESENT ILLNESS:  Pranish Akhavan  is a 85 y.o. male came in with shortness of breath   VITAL SIGNS:  Blood pressure (!) 102/44, pulse 85, temperature 98.6 F (37 C), resp. rate 19, height 6\' 3"  (1.905 m), weight 81.6 kg, SpO2 98 %.  I/O:    Intake/Output Summary (Last 24 hours) at 07/30/2019 1401 Last data filed at 07/30/2019 0245 Gross per 24 hour  Intake 240 ml  Output 550 ml  Net -310 ml      DATA REVIEW:   CBC Recent Labs  Lab 07/29/19 0601  WBC 9.9  HGB 8.8*  HCT 28.2*  PLT 83*    Chemistries  Recent Labs  Lab 07/27/19 1106 07/27/19 1610  07/29/19 0601  NA 140  --    < > 140  K 5.0  --    < > 4.3  CL 108  --    < > 108  CO2 24  --    < > 20*  GLUCOSE 280*  --    < > 326*  BUN 72*  --    < > 55*  CREATININE 1.12  --    < > 0.97  CALCIUM 9.1  --    < > 8.3*  MG  --  2.2  --   --   AST 43*  --   --   --   ALT 51*  --   --   --   ALKPHOS 190*  --   --   --   BILITOT 1.1  --   --   --    < > = values in this interval not displayed.     Microbiology Results  Results for orders placed or performed during the hospital encounter of 07/27/19  Culture, blood (routine x 2)     Status: None (Preliminary result)   Collection Time: 07/27/19 11:06 AM   Specimen: BLOOD  Result Value Ref Range Status   Specimen Description BLOOD LEFT FA  Final   Special Requests   Final    BOTTLES DRAWN AEROBIC AND ANAEROBIC Blood Culture adequate volume   Culture   Final    NO GROWTH 3 DAYS Performed at [facility], [address] [place], [place] [address]    Report Status PENDING  Incomplete  Culture,  blood (routine  x 2)     Status: None (Preliminary result)   Collection Time: 07/27/19 11:06 AM   Specimen: BLOOD  Result Value Ref Range Status   Specimen Description BLOOD LEFT HAND  Final   Special Requests   Final    BOTTLES DRAWN AEROBIC AND ANAEROBIC Blood Culture results may not be optimal due to an excessive volume of blood received in culture bottles   Culture   Final    NO GROWTH 3 DAYS Performed at [facility], [address] [place], [place] [address]    Report Status PENDING  Incomplete  SARS Coronavirus 2 by RT PCR (hospital order, performed in [facility] hospital lab) Nasopharyngeal Nasopharyngeal Swab     Status: None   Collection Time: 07/27/19 11:06 AM   Specimen: Nasopharyngeal Swab  Result Value Ref Range Status   SARS Coronavirus 2 NEGATIVE NEGATIVE Final    Comment: (NOTE) If result is NEGATIVE SARS-CoV-2 target nucleic acids are NOT DETECTED. The SARS-CoV-2 RNA is generally detectable in upper and lower  respiratory specimens during the acute phase of infection. The lowest  concentration of SARS-CoV-2 viral copies this assay can detect is 250  copies / mL. A negative result does not preclude SARS-CoV-2 infection  and should not be used as the sole basis for treatment or other  patient management decisions.  A negative result may occur with  improper specimen collection / handling, submission of specimen other  than nasopharyngeal swab, presence of viral mutation(s) within the  areas targeted by this assay, and inadequate number of viral copies  (<250 copies / mL). A negative result must be combined with clinical  observations, patient history, and epidemiological information. If result is POSITIVE SARS-CoV-2 target nucleic acids are DETECTED. The SARS-CoV-2 RNA is generally detectable in upper and lower  respiratory specimens dur ing the acute phase of infection.  Positive  results are indicative of active infection with SARS-CoV-2.  Clinical   correlation with patient history and other diagnostic information is  necessary to determine patient infection status.  Positive results do  not rule out bacterial infection or co-infection with other viruses. If result is PRESUMPTIVE POSTIVE SARS-CoV-2 nucleic acids MAY BE PRESENT.   A presumptive positive result was obtained on the submitted specimen  and confirmed on repeat testing.  While 2019 novel coronavirus  (SARS-CoV-2) nucleic acids may be present in the submitted sample  additional confirmatory testing may be necessary for epidemiological  and / or clinical management purposes  to differentiate between  SARS-CoV-2 and other Sarbecovirus currently known to infect humans.  If clinically indicated additional testing with an alternate test  methodology [id number]) is advised. The SARS-CoV-2 RNA is generally  detectable in upper and lower respiratory sp ecimens during the acute  phase of infection. The expected result is Negative. Fact Sheet for Patients:  BoilerBrush.com.cyhttps://www.fda.gov/media/136312/download Fact Sheet for Healthcare Providers: https://pope.com/https://www.fda.gov/media/136313/download This test is not yet approved or cleared by the [place] FDA and has been authorized for detection and/or diagnosis of SARS-CoV-2 by FDA under an Emergency Use Authorization (EUA).  This EUA will remain in effect (meaning this test can be used) for the duration of the COVID-19 declaration under Section 564(b)(1) of the Act, 21 U.S.C. section 360bbb-3(b)(1), unless the authorization is terminated or revoked sooner. Performed at [facility], [address] [place], [place] [address]   MRSA PCR Screening     Status: None   Collection Time: 07/27/19  3:42 PM   Specimen: Nasal Mucosa; Nasopharyngeal  Result Value Ref Range Status   MRSA by PCR NEGATIVE NEGATIVE Final    Comment:        The GeneXpert MRSA Assay (FDA approved for NASAL specimens only), is one component of a comprehensive  MRSA colonization surveillance program. It is not intended to diagnose MRSA infection nor to guide or monitor treatment for MRSA infections. Performed at [facility], [address], [place], [place] [address]       Management plans discussed with the patient, family and they are in agreement.  CODE STATUS:     Code Status Orders  (From admission, onward)         Start     Ordered   07/27/19 1520  Do not attempt resuscitation (DNR)  Continuous    Question Answer Comment  In the event of cardiac or respiratory ARREST Do not call a "code blue"   In the event of cardiac or respiratory ARREST Do not perform Intubation, CPR, defibrillation or ACLS   In the event of cardiac or respiratory ARREST Use medication by any route, position, wound care, and other measures to relive pain and suffering. May use oxygen, suction and manual treatment of airway obstruction as needed for comfort.      07/27/19 1519        Code Status History    Date Active Date Inactive Code Status Order ID Comments User Context   02/17/2018 2251 02/20/2018 2008 DNR [id number]  Cammy Copa, MD Inpatient   07/12/2015 0645 07/14/2015 2009 DNR [id number]  Arnaldo Natal, MD ED   07/12/2015 0627 07/12/2015 0645 Full Code [id number]  Arnaldo Natal, MD ED   11/07/2013 1004 11/08/2013 1420 DNR [id number]  Fredirick Maudlin, MD Inpatient   11/06/2013 0247 11/07/2013 1004 Full Code [id number]  Haydee Monica, MD Inpatient   11/30/2011 0106 11/30/2011 1958 Full Code [id number]  Joya Gaskins, MD ED   Advance Care Planning Activity      TOTAL TIME TAKING CARE OF THIS PATIENT: 35 minutes.    Alford Highland M.D on 07/30/2019 at 2:01 PM  Between 7am to 6pm - Pager - [phone]  After 6pm go to [email or web address] - password [email or web address]  [facility] Office  [phone]  CC: Primary care physician; Evangeline Gula, MD

## 2019-07-30 NOTE — Progress Notes (Signed)
Patient discharged to [facility] per MD order. Report called to [place] at facility. EMS called for transport.

## 2019-07-30 NOTE — Progress Notes (Signed)
Patient ID: [name], male   DOB: 08/20/1933, 85 y.o.   MRN: [id number]  [employer] [facility] PROGRESS NOTE  William DarlingRalph W [name] NWG:[id number] DOB: 08/20/1933 DOA: 07/27/2019 PCP: Evangeline GulaEmbree, Genevieve, MD  HPI/Subjective: Patient seems little more alert today and answers a few more questions.  Looks a little more comfortable with his breathing today.  Objective: Vitals:   07/29/19 0801 07/30/19 0433  BP: (!) 115/52 (!) 102/44  Pulse: 72 85  Resp:  19  Temp: (!) 97.4 F (36.3 C) 98.6 F (37 C)  SpO2: 100% 98%    Intake/Output Summary (Last 24 hours) at 07/30/2019 1347 Last data filed at 07/30/2019 0245 Gross per 24 hour  Intake 240 ml  Output 550 ml  Net -310 ml   Filed Weights   07/27/19 1100  Weight: 81.6 kg    ROS: Review of Systems  Unable to perform ROS: Acuity of condition  Respiratory: Negative for shortness of breath.   Cardiovascular: Negative for chest pain.  Gastrointestinal: Negative for abdominal pain.   Exam: Physical Exam  Constitutional: He appears lethargic.  HENT:  Nose: No mucosal edema.  Mouth/Throat: No oropharyngeal exudate.  Eyes: Lids are normal. Right pupil is not reactive.  Neck: Carotid bruit is not present.  Cardiovascular: Regular rhythm, S1 normal, S2 normal and normal heart sounds.  Respiratory: He has decreased breath sounds in the right lower field and the left lower field. He has rhonchi in the right lower field and the left lower field.  GI: Soft. Bowel sounds are normal. There is no abdominal tenderness.  Musculoskeletal:     Right ankle: He exhibits swelling.     Left ankle: He exhibits swelling.  Neurological: He appears lethargic.  Able to move his arms and wiggle his toes  Skin: Skin is dry. No rash noted.  Psychiatric: He has a normal mood and affect.      Data Reviewed: Basic Metabolic Panel: Recent Labs  Lab 07/27/19 1106 07/27/19 1610 07/28/19 0453 07/29/19 0601  NA 140  --  140 140  K 5.0  --  4.8 4.3  CL 108   --  108 108  CO2 24  --  15* 20*  GLUCOSE 280*  --  265* 326*  BUN 72*  --  60* 55*  CREATININE 1.12  --  0.91 0.97  CALCIUM 9.1  --  8.3* 8.3*  MG  --  2.2  --   --    Liver Function Tests: Recent Labs  Lab 07/27/19 1106  AST 43*  ALT 51*  ALKPHOS 190*  BILITOT 1.1  PROT 6.8  ALBUMIN 2.8*   CBC: Recent Labs  Lab 07/27/19 1106 07/28/19 0453 07/29/19 0601  WBC 9.8 9.5 9.9  NEUTROABS 7.3  --   --   HGB 8.6* 8.5* 8.8*  HCT 27.5* 27.0* 28.2*  MCV 92.3 90.9 93.1  PLT 76* 78* 83*   BNP (last 3 results) Recent Labs    07/27/19 1106  BNP 1,059.0*     CBG: Recent Labs  Lab 07/28/19 1226 07/28/19 1634 07/28/19 2040 07/29/19 0801 07/29/19 1211  GLUCAP 297* 340* 297* 300* 272*    Recent Results (from the past 240 hour(s))  Culture, blood (routine x 2)     Status: None (Preliminary result)   Collection Time: 07/27/19 11:06 AM   Specimen: BLOOD  Result Value Ref Range Status   Specimen Description BLOOD LEFT FA  Final   Special Requests   Final    BOTTLES DRAWN  AEROBIC AND ANAEROBIC Blood Culture adequate volume   Culture   Final    NO GROWTH 3 DAYS Performed at [facility], [address] [place], [place] [address]    Report Status PENDING  Incomplete  Culture, blood (routine x 2)     Status: None (Preliminary result)   Collection Time: 07/27/19 11:06 AM   Specimen: BLOOD  Result Value Ref Range Status   Specimen Description BLOOD LEFT HAND  Final   Special Requests   Final    BOTTLES DRAWN AEROBIC AND ANAEROBIC Blood Culture results may not be optimal due to an excessive volume of blood received in culture bottles   Culture   Final    NO GROWTH 3 DAYS Performed at [facility], [address] [place], [place] [address]    Report Status PENDING  Incomplete  SARS Coronavirus 2 by RT PCR (hospital order, performed in [place] hospital lab) Nasopharyngeal Nasopharyngeal Swab     Status: None   Collection Time: 07/27/19 11:06 AM    Specimen: Nasopharyngeal Swab  Result Value Ref Range Status   SARS Coronavirus 2 NEGATIVE NEGATIVE Final    Comment: (NOTE) If result is NEGATIVE SARS-CoV-2 target nucleic acids are NOT DETECTED. The SARS-CoV-2 RNA is generally detectable in upper and lower  respiratory specimens during the acute phase of infection. The lowest  concentration of SARS-CoV-2 viral copies this assay can detect is 250  copies / mL. A negative result does not preclude SARS-CoV-2 infection  and should not be used as the sole basis for treatment or other  patient management decisions.  A negative result may occur with  improper specimen collection / handling, submission of specimen other  than nasopharyngeal swab, presence of viral mutation(s) within the  areas targeted by this assay, and inadequate number of viral copies  (<250 copies / mL). A negative result must be combined with clinical  observations, patient history, and epidemiological information. If result is POSITIVE SARS-CoV-2 target nucleic acids are DETECTED. The SARS-CoV-2 RNA is generally detectable in upper and lower  respiratory specimens dur ing the acute phase of infection.  Positive  results are indicative of active infection with SARS-CoV-2.  Clinical  correlation with patient history and other diagnostic information is  necessary to determine patient infection status.  Positive results do  not rule out bacterial infection or co-infection with other viruses. If result is PRESUMPTIVE POSTIVE SARS-CoV-2 nucleic acids MAY BE PRESENT.   A presumptive positive result was obtained on the submitted specimen  and confirmed on repeat testing.  While 2019 novel coronavirus  (SARS-CoV-2) nucleic acids may be present in the submitted sample  additional confirmatory testing may be necessary for epidemiological  and / or clinical management purposes  to differentiate between  SARS-CoV-2 and other Sarbecovirus currently known to infect humans.  If  clinically indicated additional testing with an alternate test  methodology [id number]) is advised. The SARS-CoV-2 RNA is generally  detectable in upper and lower respiratory sp ecimens during the acute  phase of infection. The expected result is Negative. Fact Sheet for Patients:  StrictlyIdeas.no Fact Sheet for Healthcare Providers: BankingDealers.co.za This test is not yet approved or cleared by the [place] FDA and has been authorized for detection and/or diagnosis of SARS-CoV-2 by FDA under an Emergency Use Authorization (EUA).  This EUA will remain in effect (meaning this test can be used) for the duration of the COVID-19 declaration under Section 564(b)(1) of the Act, 21 U.S.C. section 360bbb-3(b)(1),  unless the authorization is terminated or revoked sooner. Performed at [facility], [address] [place], [place] [address]   MRSA PCR Screening     Status: None   Collection Time: 07/27/19  3:42 PM   Specimen: Nasal Mucosa; Nasopharyngeal  Result Value Ref Range Status   MRSA by PCR NEGATIVE NEGATIVE Final    Comment:        The GeneXpert MRSA Assay (FDA approved for NASAL specimens only), is one component of a comprehensive MRSA colonization surveillance program. It is not intended to diagnose MRSA infection nor to guide or monitor treatment for MRSA infections. Performed at [facility], [address] [place], [place] [address]       Scheduled Meds: . feeding supplement (NEPRO CARB STEADY)  237 mL Oral BID BM  . gabapentin  300 mg Oral BID  . senna-docusate  2 tablet Oral BID  . tamsulosin  0.4 mg Oral Daily  . venlafaxine XR  150 mg Oral Q breakfast   Continuous Infusions:  Assessment/Plan:  1. Acute hypoxic respiratory failure.  Continue oxygen supplementation 2. Aspiration pneumonia.  Antibiotics stopped by palliative 3. COPD.  4. Anemia of chronic disease 5. Type 2 diabetes  mellitus 6. History of CVA nonhemorrhagic 7. Dementia 8. Chronic thrombocytopenia 9. Sacral skin breakdown secondary to incontinence  Patient awaiting bed at the hospice home.  Patient is on dysphagia 3 diet with thin liquids.  Overall prognosis poor with swallowing issues and aspiration pneumonia.  Comfort care measures ordered.  Appreciate palliative consultation.    Code Status:     Code Status Orders  (From admission, onward)         Start     Ordered   07/27/19 1520  Do not attempt resuscitation (DNR)  Continuous    Question Answer Comment  In the event of cardiac or respiratory ARREST Do not call a "code blue"   In the event of cardiac or respiratory ARREST Do not perform Intubation, CPR, defibrillation or ACLS   In the event of cardiac or respiratory ARREST Use medication by any route, position, wound care, and other measures to relive pain and suffering. May use oxygen, suction and manual treatment of airway obstruction as needed for comfort.      07/27/19 1519        Code Status History    Date Active Date Inactive Code Status Order ID Comments User Context   02/17/2018 2251 02/20/2018 2008 DNR [id number]  Cammy Copa, MD Inpatient   07/12/2015 0645 07/14/2015 2009 DNR [id number]  Arnaldo Natal, MD ED   07/12/2015 0627 07/12/2015 0645 Full Code [id number]  Arnaldo Natal, MD ED   11/07/2013 1004 11/08/2013 1420 DNR [id number]  Fredirick Maudlin, MD Inpatient   11/06/2013 0247 11/07/2013 1004 Full Code [id number]  Haydee Monica, MD Inpatient   11/30/2011 0106 11/30/2011 1958 Full Code [id number]  Joya Gaskins, MD ED   Advance Care Planning Activity     Family Communication: Spoke with daughter on the phone yesterday and I left a message today disposition Plan: Hospice home when bed available  Time spent: 28 minutes  Uvaldo Rybacki [employer]

## 2019-07-30 NOTE — TOC Transition Note (Signed)
Transition of Care [facility]) - CM/SW Discharge Note   Patient Details  Name: [name] MRN: [id number] Date of Birth: 1933/05/07  [facility] CM/SW Contact:  Latanya Maudlin, RN Phone Number: 07/30/2019, 10:12 AM   Clinical Narrative:  Damaris Schooner with Katharine Look in admissions at [facility]. No beds currently available for patient. As this RNCM is also on for this weekend she provided me with the weekend number and ask that I follow up with them Sat/Sunday for availability.        Barriers to Discharge: Continued Medical Work up   Patient Goals and CMS Choice   CMS Medicare.gov Compare Post Acute Care list provided to:: Patient Represenative (must comment)(daughter) Choice offered to / list presented to : Adult Children  Discharge Placement                       Discharge Plan and Services   Discharge Planning Services: CM Consult Post Acute Care Choice: Hospice                               Social Determinants of Health (SDOH) Interventions     Readmission Risk Interventions No flowsheet data found.

## 2019-07-30 NOTE — TOC Transition Note (Signed)
Transition of Care [facility]) - CM/SW Discharge Note   Patient Details  Name: [name] MRN: [id number] Date of Birth: 07-23-1933  [facility]) CM/SW Contact:  Latanya Maudlin, RN Phone Number: 07/30/2019, 2:45 PM   Clinical Narrative: Patient medically cleared to return to [facility]. Previously was going to [facility] facility but patient has progressed and will do hospice at [facility]. Spoke to [employer] in admissions and she will be able to take patient back and their SW will compete hospice referral. EMS for transport. Relayed all of this to daughter at bedside and she agrees with the disposition.Number for report on chart, bedside RN to call report.       Final next level of care: Skilled Nursing Facility Barriers to Discharge: No Barriers Identified   Patient Goals and CMS Choice   CMS Medicare.gov Compare Post Acute Care list provided to:: Patient Choice offered to / list presented to : Adult Children  Discharge Placement              Patient chooses bed at: [facility] Patient to be transferred to facility by: EMS Name of family member notified: saughter at bedisde Patient and family notified of of transfer: 07/30/19  Discharge Plan and Services   Discharge Planning Services: CM Consult Post Acute Care Choice: Hospice                               Social Determinants of Health (SDOH) Interventions     Readmission Risk Interventions No flowsheet data found.

## 2019-07-30 NOTE — Progress Notes (Signed)
Palliative: [name] family has chosen to focus on comfort and dignity, let nature take it's course.  He is full comfort care.  Chart reviewed, awaiting residential hospice bed at [facility].    Plan:  Full comfort care, awaiting residential hospice bed.   No charge William Axe, NP Palliative Medicine Team Team Phone # [phone] Greater than 50% of this time was spent counseling and coordinating care related to the above assessment and plan.

## 2019-08-01 LAB — CULTURE, BLOOD (ROUTINE X 2)
Culture: NO GROWTH
Culture: NO GROWTH
Special Requests: ADEQUATE

## 2019-08-10 DIAGNOSIS — Z7189 Other specified counseling: Secondary | ICD-10-CM

## 2019-08-10 DIAGNOSIS — Z515 Encounter for palliative care: Secondary | ICD-10-CM

## 2019-08-15 DEATH — deceased
